# Patient Record
Sex: Male | Born: 1945 | Race: White | Hispanic: No | Marital: Married | Smoking: Former smoker
Health system: Southern US, Community
[De-identification: ages and names within clinical notes are randomized; demographics above are authoritative.]

## PROBLEM LIST (undated history)

## (undated) DIAGNOSIS — R51 Headache: Secondary | ICD-10-CM

## (undated) DIAGNOSIS — E785 Hyperlipidemia, unspecified: Secondary | ICD-10-CM

## (undated) DIAGNOSIS — I10 Essential (primary) hypertension: Secondary | ICD-10-CM

## (undated) DIAGNOSIS — N400 Enlarged prostate without lower urinary tract symptoms: Secondary | ICD-10-CM

## (undated) DIAGNOSIS — R351 Nocturia: Secondary | ICD-10-CM

## (undated) DIAGNOSIS — K219 Gastro-esophageal reflux disease without esophagitis: Secondary | ICD-10-CM

## (undated) DIAGNOSIS — C61 Malignant neoplasm of prostate: Secondary | ICD-10-CM

## (undated) DIAGNOSIS — K649 Unspecified hemorrhoids: Secondary | ICD-10-CM

## (undated) DIAGNOSIS — R011 Cardiac murmur, unspecified: Secondary | ICD-10-CM

## (undated) HISTORY — PX: COLONOSCOPY: SHX174

## (undated) HISTORY — PX: INGUINAL HERNIA REPAIR: SUR1180

## (undated) HISTORY — DX: Headache: R51

## (undated) HISTORY — DX: Hyperlipidemia, unspecified: E78.5

## (undated) HISTORY — DX: Cardiac murmur, unspecified: R01.1

---

## 2000-10-25 ENCOUNTER — Ambulatory Visit (HOSPITAL_COMMUNITY): Admission: RE | Admit: 2000-10-25 | Discharge: 2000-10-25 | Payer: Self-pay | Admitting: Surgery

## 2003-09-24 ENCOUNTER — Ambulatory Visit (HOSPITAL_COMMUNITY): Admission: RE | Admit: 2003-09-24 | Discharge: 2003-09-24 | Payer: Self-pay | Admitting: Internal Medicine

## 2004-02-11 ENCOUNTER — Emergency Department (HOSPITAL_COMMUNITY): Admission: EM | Admit: 2004-02-11 | Discharge: 2004-02-11 | Payer: Self-pay | Admitting: Emergency Medicine

## 2004-09-25 ENCOUNTER — Encounter (INDEPENDENT_AMBULATORY_CARE_PROVIDER_SITE_OTHER): Payer: Self-pay | Admitting: *Deleted

## 2004-09-25 ENCOUNTER — Ambulatory Visit (HOSPITAL_BASED_OUTPATIENT_CLINIC_OR_DEPARTMENT_OTHER): Admission: RE | Admit: 2004-09-25 | Discharge: 2004-09-25 | Payer: Self-pay | Admitting: Plastic Surgery

## 2004-09-25 ENCOUNTER — Ambulatory Visit (HOSPITAL_COMMUNITY): Admission: RE | Admit: 2004-09-25 | Discharge: 2004-09-25 | Payer: Self-pay | Admitting: Plastic Surgery

## 2004-09-25 HISTORY — PX: HEMANGIOMA EXCISION: SHX1734

## 2004-11-15 ENCOUNTER — Ambulatory Visit: Payer: Self-pay | Admitting: Internal Medicine

## 2004-11-22 ENCOUNTER — Ambulatory Visit: Payer: Self-pay | Admitting: Internal Medicine

## 2005-02-21 ENCOUNTER — Ambulatory Visit: Payer: Self-pay | Admitting: Internal Medicine

## 2005-02-28 ENCOUNTER — Ambulatory Visit: Payer: Self-pay | Admitting: Internal Medicine

## 2005-08-23 ENCOUNTER — Ambulatory Visit: Payer: Self-pay | Admitting: Internal Medicine

## 2005-08-31 ENCOUNTER — Ambulatory Visit: Payer: Self-pay | Admitting: Internal Medicine

## 2005-10-16 ENCOUNTER — Ambulatory Visit: Payer: Self-pay | Admitting: Internal Medicine

## 2005-12-03 ENCOUNTER — Ambulatory Visit: Payer: Self-pay | Admitting: Internal Medicine

## 2005-12-10 ENCOUNTER — Ambulatory Visit: Payer: Self-pay | Admitting: Internal Medicine

## 2006-02-05 ENCOUNTER — Ambulatory Visit: Payer: Self-pay | Admitting: Internal Medicine

## 2006-02-12 ENCOUNTER — Ambulatory Visit: Payer: Self-pay | Admitting: Internal Medicine

## 2006-10-21 ENCOUNTER — Ambulatory Visit: Payer: Self-pay | Admitting: Internal Medicine

## 2006-12-10 ENCOUNTER — Ambulatory Visit: Payer: Self-pay | Admitting: Internal Medicine

## 2006-12-10 LAB — CONVERTED CEMR LAB
ALT: 24 units/L (ref 0–40)
AST: 22 units/L (ref 0–37)
Albumin: 4.2 g/dL (ref 3.5–5.2)
Alkaline Phosphatase: 47 units/L (ref 39–117)
BUN: 17 mg/dL (ref 6–23)
Basophils Absolute: 0 10*3/uL (ref 0.0–0.1)
Basophils Relative: 0.4 % (ref 0.0–1.0)
CO2: 27 meq/L (ref 19–32)
Calcium: 9.4 mg/dL (ref 8.4–10.5)
Chloride: 108 meq/L (ref 96–112)
Cholesterol: 191 mg/dL (ref 0–200)
Creatinine, Ser: 1.1 mg/dL (ref 0.4–1.5)
Eosinophils Relative: 2.6 % (ref 0.0–5.0)
GFR calc Af Amer: 87 mL/min
GFR calc non Af Amer: 72 mL/min
Glucose, Bld: 102 mg/dL — ABNORMAL HIGH (ref 70–99)
HCT: 44 % (ref 39.0–52.0)
HDL: 66.7 mg/dL (ref >39.0)
Hemoglobin: 15.2 g/dL (ref 13.0–17.0)
LDL Cholesterol: 114 mg/dL — ABNORMAL HIGH (ref 0–99)
Lymphocytes Relative: 37 % (ref 12.0–46.0)
MCHC: 34.5 g/dL (ref 30.0–36.0)
MCV: 91.4 fL (ref 78.0–100.0)
Monocytes Absolute: 0.5 10*3/uL (ref 0.2–0.7)
Monocytes Relative: 10.7 % (ref 3.0–11.0)
Neutro Abs: 2.4 10*3/uL (ref 1.4–7.7)
Neutrophils Relative %: 49.3 % (ref 43.0–77.0)
PSA: 2.14 ng/mL (ref 0.10–4.00)
Platelets: 370 10*3/uL (ref 150–400)
Potassium: 4 meq/L (ref 3.5–5.1)
RBC: 4.81 M/uL (ref 4.22–5.81)
RDW: 12.8 % (ref 11.5–14.6)
Sodium: 141 meq/L (ref 135–145)
TSH: 0.83 microintl units/mL (ref 0.35–5.50)
Total Bilirubin: 0.9 mg/dL (ref 0.3–1.2)
Total CHOL/HDL Ratio: 2.9
Total Protein: 6.7 g/dL (ref 6.0–8.3)
Triglycerides: 52 mg/dL (ref 0–149)
VLDL: 10 mg/dL (ref 0–40)
WBC: 4.7 10*3/uL (ref 4.5–10.5)

## 2006-12-17 ENCOUNTER — Ambulatory Visit: Payer: Self-pay | Admitting: Internal Medicine

## 2007-01-16 ENCOUNTER — Ambulatory Visit: Payer: Self-pay | Admitting: Internal Medicine

## 2007-07-22 DIAGNOSIS — R51 Headache: Secondary | ICD-10-CM | POA: Insufficient documentation

## 2007-07-22 DIAGNOSIS — E785 Hyperlipidemia, unspecified: Secondary | ICD-10-CM | POA: Insufficient documentation

## 2007-07-22 DIAGNOSIS — R519 Headache, unspecified: Secondary | ICD-10-CM | POA: Insufficient documentation

## 2009-03-04 ENCOUNTER — Ambulatory Visit: Payer: Self-pay | Admitting: Internal Medicine

## 2009-03-04 ENCOUNTER — Telehealth: Payer: Self-pay | Admitting: Internal Medicine

## 2009-03-04 DIAGNOSIS — J0100 Acute maxillary sinusitis, unspecified: Secondary | ICD-10-CM | POA: Insufficient documentation

## 2009-03-09 ENCOUNTER — Ambulatory Visit: Payer: Self-pay | Admitting: Internal Medicine

## 2010-01-31 ENCOUNTER — Telehealth: Payer: Self-pay | Admitting: Internal Medicine

## 2010-01-31 ENCOUNTER — Ambulatory Visit: Payer: Self-pay | Admitting: Internal Medicine

## 2010-01-31 DIAGNOSIS — M67919 Unspecified disorder of synovium and tendon, unspecified shoulder: Secondary | ICD-10-CM | POA: Insufficient documentation

## 2010-01-31 DIAGNOSIS — S40029A Contusion of unspecified upper arm, initial encounter: Secondary | ICD-10-CM | POA: Insufficient documentation

## 2010-01-31 DIAGNOSIS — M719 Bursopathy, unspecified: Secondary | ICD-10-CM | POA: Insufficient documentation

## 2010-03-27 ENCOUNTER — Ambulatory Visit: Payer: Self-pay | Admitting: Internal Medicine

## 2010-03-27 LAB — CONVERTED CEMR LAB
ALT: 19 units/L (ref 0–53)
AST: 21 units/L (ref 0–37)
Albumin: 4.5 g/dL (ref 3.5–5.2)
Alkaline Phosphatase: 52 units/L (ref 39–117)
BUN: 16 mg/dL (ref 6–23)
Basophils Absolute: 0 10*3/uL (ref 0.0–0.1)
Basophils Relative: 0.9 % (ref 0.0–3.0)
Bilirubin Urine: NEGATIVE
Bilirubin, Direct: 0.2 mg/dL (ref 0.0–0.3)
Blood in Urine, dipstick: NEGATIVE
CO2: 30 meq/L (ref 19–32)
Calcium: 9.6 mg/dL (ref 8.4–10.5)
Chloride: 105 meq/L (ref 96–112)
Cholesterol: 221 mg/dL — ABNORMAL HIGH (ref 0–200)
Creatinine, Ser: 0.8 mg/dL (ref 0.4–1.5)
Direct LDL: 134.3 mg/dL
Eosinophils Absolute: 0.1 10*3/uL (ref 0.0–0.7)
Eosinophils Relative: 1.2 % (ref 0.0–5.0)
GFR calc non Af Amer: 109.25 mL/min (ref >60)
Glucose, Bld: 96 mg/dL (ref 70–99)
Glucose, Urine, Semiquant: NEGATIVE
HCT: 44.4 % (ref 39.0–52.0)
HDL: 72.3 mg/dL (ref >39.00)
Hemoglobin: 15.3 g/dL (ref 13.0–17.0)
Ketones, urine, test strip: NEGATIVE
Lymphocytes Relative: 38.4 % (ref 12.0–46.0)
Lymphs Abs: 2 10*3/uL (ref 0.7–4.0)
MCHC: 34.3 g/dL (ref 30.0–36.0)
MCV: 92.1 fL (ref 78.0–100.0)
Monocytes Absolute: 0.4 10*3/uL (ref 0.1–1.0)
Monocytes Relative: 8.1 % (ref 3.0–12.0)
Neutro Abs: 2.7 10*3/uL (ref 1.4–7.7)
Neutrophils Relative %: 51.4 % (ref 43.0–77.0)
Nitrite: NEGATIVE
PSA: 4.69 ng/mL — ABNORMAL HIGH (ref 0.10–4.00)
Platelets: 314 10*3/uL (ref 150.0–400.0)
Potassium: 4.7 meq/L (ref 3.5–5.1)
Protein, U semiquant: NEGATIVE
RBC: 4.82 M/uL (ref 4.22–5.81)
RDW: 14.4 % (ref 11.5–14.6)
Sodium: 142 meq/L (ref 135–145)
Specific Gravity, Urine: 1.015
TSH: 0.76 microintl units/mL (ref 0.35–5.50)
Total Bilirubin: 1.2 mg/dL (ref 0.3–1.2)
Total CHOL/HDL Ratio: 3
Total Protein: 6.9 g/dL (ref 6.0–8.3)
Triglycerides: 48 mg/dL (ref 0.0–149.0)
Urobilinogen, UA: 0.2
VLDL: 9.6 mg/dL (ref 0.0–40.0)
WBC Urine, dipstick: NEGATIVE
WBC: 5.2 10*3/uL (ref 4.5–10.5)
pH: 5.5

## 2010-04-03 ENCOUNTER — Ambulatory Visit: Payer: Self-pay | Admitting: Internal Medicine

## 2010-04-03 DIAGNOSIS — C61 Malignant neoplasm of prostate: Secondary | ICD-10-CM | POA: Insufficient documentation

## 2010-04-10 ENCOUNTER — Encounter (INDEPENDENT_AMBULATORY_CARE_PROVIDER_SITE_OTHER): Payer: Self-pay | Admitting: *Deleted

## 2010-05-05 ENCOUNTER — Encounter (INDEPENDENT_AMBULATORY_CARE_PROVIDER_SITE_OTHER): Payer: Self-pay | Admitting: *Deleted

## 2010-05-08 ENCOUNTER — Ambulatory Visit: Payer: Self-pay | Admitting: Gastroenterology

## 2010-05-23 ENCOUNTER — Telehealth: Payer: Self-pay | Admitting: Gastroenterology

## 2010-05-24 ENCOUNTER — Ambulatory Visit: Payer: Self-pay | Admitting: Internal Medicine

## 2010-06-01 LAB — CONVERTED CEMR LAB
PSA, Free Pct: 15 — ABNORMAL LOW (ref >25)
PSA, Free: 0.6 ng/mL
PSA: 4.02 ng/mL — ABNORMAL HIGH (ref 0.10–4.00)

## 2010-08-09 ENCOUNTER — Ambulatory Visit: Payer: Self-pay | Admitting: Internal Medicine

## 2010-08-15 ENCOUNTER — Ambulatory Visit: Payer: Self-pay | Admitting: Gastroenterology

## 2010-10-23 ENCOUNTER — Telehealth: Payer: Self-pay | Admitting: Internal Medicine

## 2010-10-30 ENCOUNTER — Encounter: Payer: Self-pay | Admitting: Internal Medicine

## 2010-11-01 ENCOUNTER — Encounter
Admission: RE | Admit: 2010-11-01 | Discharge: 2010-11-01 | Payer: Self-pay | Source: Home / Self Care | Attending: Neurology | Admitting: Neurology

## 2010-12-21 NOTE — Assessment & Plan Note (Signed)
Summary: FLU SHOT // RS  Nurse Visit   Vitals Entered By: Duard Brady LPN (August 09, 2010 2:35 PM)  Orders Added: 1)  Flu Vaccine 43yrs + MEDICARE PATIENTS [Q2039] 2)  Administration Flu vaccine - MCR [G0008] Flu Vaccine Consent Questions     Do you have a history of severe allergic reactions to this vaccine? no    Any prior history of allergic reactions to egg and/or gelatin? no    Do you have a sensitivity to the preservative Thimersol? no    Do you have a past history of Guillan-Barre Syndrome? no    Do you currently have an acute febrile illness? no    Have you ever had a severe reaction to latex? no    Vaccine information given and explained to patient? yes    Are you currently pregnant? no    Lot Number:[id number]   Exp Date:05/19/2011   Site Given  Left Deltoid IM.lbmedflu

## 2010-12-21 NOTE — Assessment & Plan Note (Signed)
Summary: cpx/njr   Vital Signs:  Patient profile:   65 year old male Height:      70 inches Weight:      181 pounds BMI:     26.06 Temp:     98.2 degrees F oral Pulse rate:   72 / minute Resp:     14 per minute BP sitting:   110 / 70  (left arm)  Vitals Entered By: Willy Eddy, LPN (Apr 03, 2010 10:35 AM)  Contraindications/Deferment of Procedures/Staging:    Test/Procedure: FLU VAX    Reason for deferment: patient declined     Test/Procedure: Pneumovax vaccine    Reason for deferment: patient declined  CC: cpx--had flex >5 years ago   CC:  cpx--had flex >5 years ago.  History of Present Illness: The pt was asked about all immunizations, health maint. services that are appropriate to their age and was given guidance on diet exercize  and weight management   Preventive Screening-Counseling & Management  Alcohol-Tobacco     Smoking Status: quit  Problems Prior to Update: 1)  Bursitis, Left Shoulder  (ICD-726.10) 2)  Contusion of Unspecified Part of Upper Limb  (ICD-923.9) 3)  Acute Maxillary Sinusitis  (ICD-461.0) 4)  Headache  (ICD-784.0) 5)  Hyperlipidemia  (ICD-272.4)  Current Problems (verified): 1)  Bursitis, Left Shoulder  (ICD-726.10) 2)  Contusion of Unspecified Part of Upper Limb  (ICD-923.9) 3)  Acute Maxillary Sinusitis  (ICD-461.0) 4)  Headache  (ICD-784.0) 5)  Hyperlipidemia  (ICD-272.4)  Medications Prior to Update: 1)  Aspir-Low 81 Mg Tbec (Aspirin) .Marland Kitchen.. 1 Once Daily  Current Medications (verified): 1)  Aspir-Low 81 Mg Tbec (Aspirin) .Marland Kitchen.. 1 Once Daily 2)  Ciprofloxacin Hcl 500 Mg Tabs (Ciprofloxacin Hcl) .... One By Mouth Two Times A Day For 21 Days  Allergies (verified): 1)  ! Pcn  Past History:  Family History: Last updated: 07/22/2007 Family History of Cardiovascular disorder Family History of Respiratory disease  Social History: Last updated: 07/22/2007 Occupation: Self-Employed Married Former Smoker  Risk  Factors: Smoking Status: quit (04/03/2010)  Past medical, surgical, family and social histories (including risk factors) reviewed, and no changes noted (except as noted below).  Past Medical History: Reviewed history from 07/22/2007 and no changes required. Hyperlipidemia Headache  Past Surgical History: Reviewed history from 07/22/2007 and no changes required. Inguinal herniorrhaphy Flexible Sigmoidoscopy  Family History: Reviewed history from 07/22/2007 and no changes required. Family History of Cardiovascular disorder Family History of Respiratory disease  Social History: Reviewed history from 07/22/2007 and no changes required. Occupation: Self-Employed Married Former Smoker  Review of Systems  The patient denies anorexia, fever, weight loss, weight gain, vision loss, decreased hearing, hoarseness, chest pain, syncope, dyspnea on exertion, peripheral edema, prolonged cough, headaches, hemoptysis, abdominal pain, melena, hematochezia, severe indigestion/heartburn, hematuria, incontinence, genital sores, muscle weakness, suspicious skin lesions, transient blindness, difficulty walking, depression, unusual weight change, abnormal bleeding, enlarged lymph nodes, angioedema, breast masses, and testicular masses.    Physical Exam  General:  Well-developed,well-nourished,in no acute distress; alert,appropriate and cooperative throughout examination Head:  Normocephalic and atraumatic without obvious abnormalities. No apparent alopecia or balding. Eyes:  pupils equal and pupils round.   Ears:  R ear normal and L ear normal.   Nose:  no external deformity and no nasal discharge.   Neck:  No deformities, masses, or tenderness noted. Lungs:  normal respiratory effort and no dullness.   Heart:  normal rate and regular rhythm.   Abdomen:  soft, normal bowel  sounds, and no guarding.   Rectal:  No external abnormalities noted. Normal sphincter tone. No rectal masses or  tenderness. Genitalia:  Testes bilaterally descended without nodularity, tenderness or masses. No scrotal masses or lesions. No penis lesions or urethral discharge. Prostate:  no gland enlargement and no nodules.   Msk:  hematoma on left forearmdecreased ROM, joint tenderness, and joint swelling. left shoulder no rotator cuff weakness Pulses:  R and L carotid,radial,femoral,dorsalis pedis and posterior tibial pulses are full and equal bilaterally Extremities:  No clubbing, cyanosis, edema, or deformity noted with normal full range of motion of all joints.   Neurologic:  No cranial nerve deficits noted. Station and gait are normal. Plantar reflexes are down-going bilaterally. DTRs are symmetrical throughout. Sensory, motor and coordinative functions appear intact.   Impression & Recommendations:  Problem # 1:  HEALTH SCREENING (ICD-V70.0) The pt was asked about all immunizations, health maint. services that are appropriate to their age and was given guidance on diet exercize  and weight management  Orders:  bradycardia otherwise stable EKG w/ Interpretation (93000)  Td Booster: Historical (11/19/2001)   Chol: 221 (03/27/2010)   HDL: 72.30 (03/27/2010)   LDL: 114 (12/10/2006)   TG: 48.0 (03/27/2010) TSH: 0.76 (03/27/2010)   PSA: 4.69 (03/27/2010)  Discussed using sunscreen, use of alcohol, drug use, self testicular exam, routine dental care, routine eye care, routine physical exam, seat belts, multiple vitamins, osteoporosis prevention, adequate calcium intake in diet, and recommendations for immunizations.  Discussed exercise and checking cholesterol.  Discussed gun safety, safe sex, and contraception. Also recommend checking PSA.  Problem # 2:  PSA, INCREASED (ICD-790.93) cipro bid  fro 21 days  Complete Medication List: 1)  Aspir-low 81 Mg Tbec (Aspirin) .Marland Kitchen.. 1 once daily 2)  Ciprofloxacin Hcl 500 Mg Tabs (Ciprofloxacin hcl) .... One by mouth two times a day for 21 days  Other  Orders: Gastroenterology Referral (GI)  Patient Instructions: 1)  Please schedule a follow-up appointment in 6 weeks. 2)  PSA and free psaq prior to visit, ICD-9:790.93 Prescriptions: CIPROFLOXACIN HCL 500 MG TABS (CIPROFLOXACIN HCL) one by mouth two times a day for 21 days  #42 x 0   Entered and Authorized by:   Stacie Glaze MD   Signed by:   Stacie Glaze MD on 04/03/2010   Method used:   Electronically to        [facility]* (retail)       [address] [place], [place]  [address]       Ph: [phone] or [phone]       Fax: [phone]   RxID:   [id number]

## 2010-12-21 NOTE — Letter (Signed)
Summary: [facility] Instructions  [name]  [address] [place], [place] [address]   Phone: [phone]  Fax: [phone]       Dan Dudley    01-May-1946    MRN: [id number]        Procedure Day [name]:  Lenor Coffin  05/25/10     Arrival Time:  9:00AM     Procedure Time:  10:00AM     Location of Procedure:                    _ X_  [facility] (4th Floor)  PREPARATION FOR COLONOSCOPY WITH MOVIPREP   Starting 5 days prior to your procedure 05/20/10 do not eat nuts, seeds, popcorn, corn, beans, peas,  salads, or any raw vegetables.  Do not take any fiber supplements (e.g. Metamucil, Citrucel, and Benefiber).  THE DAY BEFORE YOUR PROCEDURE         DATE: 05/24/10  DAY: WEDNESDAY  1.  Drink clear liquids the entire day-NO SOLID FOOD  2.  Do not drink anything colored red or purple.  Avoid juices with pulp.  No orange juice.  3.  Drink at least 64 oz. (8 glasses) of fluid/clear liquids during the day to prevent dehydration and help the prep work efficiently.  CLEAR LIQUIDS INCLUDE: Water Jello Ice Popsicles Tea (sugar ok, no milk/cream) Powdered fruit flavored drinks Coffee (sugar ok, no milk/cream) Gatorade Juice: apple, white grape, white cranberry  Lemonade Clear bullion, consomm, broth Carbonated beverages (any kind) Strained chicken noodle soup Hard Candy                             4.  In the morning, mix first dose of MoviPrep solution:    Empty 1 Pouch A and 1 Pouch B into the disposable container    Add lukewarm drinking water to the top line of the container. Mix to dissolve    Refrigerate (mixed solution should be used within 24 hrs)  5.  Begin drinking the prep at 5:00 p.m. The MoviPrep container is divided by 4 marks.   Every 15 minutes drink the solution down to the next mark (approximately 8 oz) until the full liter is complete.   6.  Follow completed prep with 16 oz of clear liquid of your choice (Nothing red or purple).   Continue to drink clear liquids until bedtime.  7.  Before going to bed, mix second dose of MoviPrep solution:    Empty 1 Pouch A and 1 Pouch B into the disposable container    Add lukewarm drinking water to the top line of the container. Mix to dissolve    Refrigerate  THE DAY OF YOUR PROCEDURE      DATE: 05/25/10 DAY: THURSDAY  Beginning at 5:00AM (5 hours before procedure):         1. Every 15 minutes, drink the solution down to the next mark (approx 8 oz) until the full liter is complete.  2. Follow completed prep with 16 oz. of clear liquid of your choice.    3. You may drink clear liquids until 8:00AM (2 HOURS BEFORE PROCEDURE).   MEDICATION INSTRUCTIONS  Unless otherwise instructed, you should take regular prescription medications with a small sip of water   as early as possible the morning of your procedure.          OTHER INSTRUCTIONS  You will need a responsible adult at least  67 years of age to accompany you and drive you home.   This person must remain in the waiting room during your procedure.  Wear loose fitting clothing that is easily removed.  Leave jewelry and other valuables at home.  However, you may wish to bring a book to read or  an iPod/MP3 player to listen to music as you wait for your procedure to start.  Remove all body piercing jewelry and leave at home.  Total time from sign-in until discharge is approximately 2-3 hours.  You should go home directly after your procedure and rest.  You can resume normal activities the  day after your procedure.      The day of your procedure you should not:   Drive   Make legal decisions   Operate machinery   Drink alcohol   Return to work  You will receive specific instructions about eating, activities and medications before you leave.    The above instructions have been reviewed and explained to me by  Jennye Boroughs RN  May 08, 2010 8:11 AM  _______________________    I fully  understand and can verbalize these instructions _____________________________ Date _________

## 2010-12-21 NOTE — Progress Notes (Signed)
Summary: arm discoloration and painful  Phone Note Call from Patient   Caller: Patient Call For: Stacie Glaze MD Summary of Call: Pt's wife calls stating he starting with cramping with his left forearm.  Now his shoulder is hurting and forearm is black and blue???  No injury. Shoulder pain is worse and has appt with ORTHO next week that radiates to entire arm. Feels like a hard knot at times.  Color is mainly brown and yellow. [phone] Initial call taken by: Lynann Beaver CMA,  January 31, 2010 2:18 PM  Follow-up for Phone Call        ov given for today Follow-up by: Willy Eddy, LPN,  January 31, 2010 2:42 PM

## 2010-12-21 NOTE — Miscellaneous (Signed)
Summary: DIR COL...AS.  Clinical Lists Changes  Medications: Added new medication of MOVIPREP 100 GM  SOLR (PEG-KCL-NACL-NASULF-NA ASC-C) As per prep instructions. - Signed Rx of MOVIPREP 100 GM  SOLR (PEG-KCL-NACL-NASULF-NA ASC-C) As per prep instructions.;  #1 x 0;  Signed;  Entered by: Jennye Boroughs RN;  Authorized by: Meryl Dare MD Clementeen Graham;  Method used: Electronically to [facility]*, [address] [place], [place]  [address], Ph: [phone] or [phone], Fax: [phone] Allergies: Removed allergy or adverse reaction of PCN    Prescriptions: MOVIPREP 100 GM  SOLR (PEG-KCL-NACL-NASULF-NA ASC-C) As per prep instructions.  #1 x 0   Entered by:   Jennye Boroughs RN   Authorized by:   Meryl Dare MD [facility]   Signed by:   Jennye Boroughs RN on 05/08/2010   Method used:   Electronically to        [facility]* (retail)       [address] [place], [place]  [address]       Ph: [phone] or [phone]       Fax: [phone]   RxID:   [id number]

## 2010-12-21 NOTE — Progress Notes (Signed)
Summary: Cx Colon  Phone Note Call from Patient Call back at Home Phone [phone]   Caller: Patients Wife Summary of Call: She called and pt is on his way to [place] to be with is mother who is on a vent and has a DNR the MDs in [facility] told him it was time to come down  that she was not going to live much longer. His colonoscopy was schedule for 05/25/2010. do you want to charge? They will call back to resch when everything with the funeral is over.  Initial call taken by: Harlow Mares CMA Duncan Dull),  May 23, 2010 4:28 PM  Follow-up for Phone Call        No charge. Follow-up by: Meryl Dare MD Clementeen Graham,  May 23, 2010 9:42 PM  Additional Follow-up for Phone Call Additional follow up Details #1::        Patient NOT BILLED. Additional Follow-up by: Leanor [facility],  May 30, 2010 4:33 PM

## 2010-12-21 NOTE — Progress Notes (Signed)
  Phone Note Call from Patient Call back at Home Phone [phone]   Caller: Patient Call For: Stacie Glaze MD Summary of Call: Hit head on car door 7 weeks ago but has had constant headaches with a state of euphoria???  Headaches are getting worse. [facility].  Initial call taken by: Lynann Beaver CMA AAMA,  October 23, 2010 12:52 PM  Follow-up for Phone Call        needs to see neurologist Follow-up by: Willy Eddy, LPN,  October 23, 2010 3:17 PM  Additional Follow-up for Phone Call Additional follow up Details #1::        Notified pt. Additional Follow-up by: Lynann Beaver CMA AAMA,  October 23, 2010 4:36 PM

## 2010-12-21 NOTE — Assessment & Plan Note (Signed)
Summary: arm pain/bmw   Vital Signs:  Patient profile:   64 year old male Height:      70 inches Weight:      184 pounds BMI:     26.50 Temp:     98.2 degrees F oral Pulse rate:   72 / minute Resp:     14 per minute BP sitting:   120 / 80  (left arm)  Vitals Entered By: Willy Eddy, LPN (January 31, 2010 3:57 PM) CC: stasrted with knot in lower left arm, then had discoloration after that with no pain but does have pain in left shoulder   CC:  stasrted with knot in lower left arm and then had discoloration after that with no pain but does have pain in left shoulder.  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 i suspect he tore a vein and developed a hematoma in the forarm he has symptoms of bursitis in the left shoulder as well but rotator cuff is intact to all manipulation   Preventive Screening-Counseling & Management  Alcohol-Tobacco     Smoking Status: quit  Problems Prior to Update: 1)  Acute Maxillary Sinusitis  (ICD-461.0) 2)  Headache  (ICD-784.0) 3)  Hyperlipidemia  (ICD-272.4)  Current Problems (verified): 1)  Acute Maxillary Sinusitis  (ICD-461.0) 2)  Headache  (ICD-784.0) 3)  Hyperlipidemia  (ICD-272.4)  Medications Prior to Update: 1)  Tricor 145 Mg  Tabs (Fenofibrate) 2)  Nasonex 50 Mcg/act  Susp (Mometasone Furoate) 3)  Azithromycin 250 Mg Tabs (Azithromycin) .... Two By Mouth Now Then One By Mouth Daily For 4 Days 4)  Phenflu Dm 10-20-400-500 Mg Tabs (Phenylephrine-Dm-Gg-Apap) .... One By Mouth Qid  Current Medications (verified): 1)  Aspir-Low 81 Mg Tbec (Aspirin) .Marland Kitchen.. 1 Once Daily  Allergies (verified): 1)  ! Pcn  Past History:  Family History: Last updated: 07/22/2007 Family History of Cardiovascular disorder Family History of Respiratory disease  Social History: Last updated: 07/22/2007 Occupation: Self-Employed Married Former Smoker  Risk  Factors: Smoking Status: quit (01/31/2010)  Past medical, surgical, family and social histories (including risk factors) reviewed, and no changes noted (except as noted below).  Past Medical History: Reviewed history from 07/22/2007 and no changes required. Hyperlipidemia Headache  Past Surgical History: Reviewed history from 07/22/2007 and no changes required. Inguinal herniorrhaphy Flexible Sigmoidoscopy  Family History: Reviewed history from 07/22/2007 and no changes required. Family History of Cardiovascular disorder Family History of Respiratory disease  Social History: Reviewed history from 07/22/2007 and no changes required. Occupation: Self-Employed Married Former Smoker  Review of Systems  The patient denies anorexia, fever, weight loss, weight gain, vision loss, decreased hearing, hoarseness, chest pain, syncope, dyspnea on exertion, peripheral edema, prolonged cough, headaches, hemoptysis, abdominal pain, melena, hematochezia, severe indigestion/heartburn, hematuria, incontinence, genital sores, muscle weakness, suspicious skin lesions, transient blindness, difficulty walking, depression, unusual weight change, abnormal bleeding, enlarged lymph nodes, angioedema, breast masses, and testicular masses.    Physical Exam  General:  Well-developed,well-nourished,in no acute distress; alert,appropriate and cooperative throughout examination Head:  Normocephalic and atraumatic without obvious abnormalities. No apparent alopecia or balding. Eyes:  pupils equal and pupils round.   Ears:  R ear normal and L ear normal.   Nose:  no external deformity and no nasal discharge.   Neck:  No deformities, masses, or tenderness noted. Lungs:  normal respiratory effort and no dullness.   Heart:  normal rate and regular  rhythm.   Msk:  hematoma on left forearmdecreased ROM, joint tenderness, and joint swelling. left shoulder no rotator cuff weakness Pulses:  R and L  carotid,radial,femoral,dorsalis pedis and posterior tibial pulses are full and equal bilaterally Extremities:  No clubbing, cyanosis, edema, or deformity noted with normal full range of motion of all joints.   Neurologic:  No cranial nerve deficits noted. Station and gait are normal. Plantar reflexes are down-going bilaterally. DTRs are symmetrical throughout. Sensory, motor and coordinative functions appear intact.   Impression & Recommendations:  Problem # 1:  CONTUSION OF UNSPECIFIED PART OF UPPER LIMB (ICD-923.9) local care no skin nreakdown and hematoma is resolving  Problem # 2:  BURSITIS, LEFT SHOULDER (ICD-726.10)  Informed consent obtained and then the lef shoulder joint was prepped in a sterile manor and 40 mg depo and 1/2 cc 1% lidocaine injected into the synovial space. After care discussed. Pt tolerated procedure well.  Orders: Joint Aspirate / Injection, Large (20610) Depo- Medrol 40mg  (J1030)  Complete Medication List: 1)  Aspir-low 81 Mg Tbec (Aspirin) .Marland Kitchen.. 1 once daily

## 2010-12-21 NOTE — Consult Note (Signed)
Summary: [facility]  [facility]   Imported By: Maryln Gottron 11/03/2010 12:30:16  _____________________________________________________________________  External Attachment:    Type:   Image     Comment:   External Document

## 2010-12-21 NOTE — Procedures (Signed)
Summary: Colonoscopy  Patient: Dan Dudley Note: All result statuses are Final unless otherwise noted.  Tests: (1) Colonoscopy (COL)   COL Colonoscopy           DONE     [facility]     [address]     [place], [place]  [address]           COLONOSCOPY PROCEDURE REPORT           PATIENT:  Dan Dudley, Dan Dudley  MR#:  [id number]     BIRTHDATE:  1946-01-23, 63 yrs. old  GENDER:  male     ENDOSCOPIST:  Judie Petit T. Russella Dar, MD, [facility]     Referred by:  Stacie Glaze, M.D.     PROCEDURE DATE:  08/15/2010     PROCEDURE:  Colonoscopy 19[address]     ASA CLASS:  Class II     INDICATIONS:  1) Routine Risk Screening     MEDICATIONS:   Fentanyl 75 mcg IV, Versed 7 mg IV     DESCRIPTION OF PROCEDURE:   After the risks benefits and     alternatives of the procedure were thoroughly explained, informed     consent was obtained.  Digital rectal exam was performed and     revealed large external hemorrhoids.   The LB PCF-H180AL [id number]     endoscope was introduced through the anus and advanced to the     cecum, which was identified by both the appendix and ileocecal     valve, without limitations.  The quality of the prep was     excellent, using MoviPrep.  The instrument was then slowly     withdrawn as the colon was fully examined.     <<PROCEDUREIMAGES>>     FINDINGS:  Mild diverticulosis was found in the sigmoid colon. A     normal appearing cecum, ileocecal valve, and appendiceal orifice     were identified. The ascending, hepatic flexure, transverse,     splenic flexure, descending colon, and rectum appeared     unremarkable. Retroflexed views in the rectum revealed internal     hemorrhoids, small. The time to cecum =  1.67  minutes. The scope     was then withdrawn (time =  9  min) from the patient and the     procedure completed.           COMPLICATIONS:  None           ENDOSCOPIC IMPRESSION:     1) Mild diverticulosis in the sigmoid colon     2) internal and external  hemorrhoids           RECOMMENDATIONS:     1) High fiber diet with liberal fluid intake.     2) Colonoscopy in 10 years for routine CRC screening           Malcolm T. Russella Dar, MD, Clementeen Graham           n.     eSIGNED:   Venita Lick. Stark at 08/15/2010 10:14 AM           Cleatrice Burke, [phone]  Note: An exclamation mark (!) indicates a result that was not dispersed into the flowsheet. Document Creation Date: 08/15/2010 10:15 AM _______________________________________________________________________  (1) Order result status: Final Collection or observation date-time: 08/15/2010 10:06 Requested date-time:  Receipt date-time:  Reported date-time:  Referring Physician:   Ordering Physician: Claudette Head [phone]) Specimen Source:  Source: Launa Grill Order Number: [id number] Lab  site:   Appended Document: Colonoscopy    Clinical Lists Changes  Observations: Added new observation of COLONNXTDUE: 07/2020 (08/15/2010 15:54)

## 2010-12-21 NOTE — Letter (Signed)
Summary: Previsit letter  [facility]  [address] [place], [place] [address]   Phone: [phone]  Fax: [phone]       04/10/2010 MRN: [id number]  [facility] [address] [place], [place]  [address]  Dear Dan Dudley,  Welcome to the Gastroenterology Division at [facility].    You are scheduled to see a nurse for your pre-procedure visit on 05/08/2010 at 8:00am on the 3rd floor at [facility], [address]  We ask that you try to arrive at our office 15 minutes prior to your appointment time to allow for check-in.  Your nurse visit will consist of discussing your medical and surgical history, your immediate family medical history, and your medications.    Please bring a complete list of all your medications or, if you prefer, bring the medication bottles and we will list them.  We will need to be aware of both prescribed and over the counter drugs.  We will need to know exact dosage information as well.  If you are on blood thinners (Coumadin, Plavix, Aggrenox, Ticlid, etc.) please call our office today/prior to your appointment, as we need to consult with your physician about holding your medication.   Please be prepared to read and sign documents such as consent forms, a financial agreement, and acknowledgement forms.  If necessary, and with your consent, a friend or relative is welcome to sit-in on the nurse visit with you.  Please bring your insurance card so that we may make a copy of it.  If your insurance requires a referral to see a specialist, please bring your referral form from your primary care physician.  No co-pay is required for this nurse visit.     If you cannot keep your appointment, please call [phone] to cancel or reschedule prior to your appointment date.  This allows [place] the opportunity to schedule an appointment for another patient in need of care.    Thank you for choosing [facility] for  your medical needs.  We appreciate the opportunity to care for you.  Please visit [place] at our website  to learn more about our practice.                     Sincerely.                                                                                                                   The Gastroenterology Division

## 2011-04-06 NOTE — Op Note (Signed)
NAMEDAION, GINSBERG         ACCOUNT NO.:  [id number]   MEDICAL RECORD NO.:  [id number]          PATIENT TYPE:  AMB   LOCATION:  DSC                          FACILITY:  [facility]   PHYSICIAN:  Alfredia Ferguson, M.D.  DATE OF BIRTH:  July 31, 1946   DATE OF PROCEDURE:  09/25/2004  DATE OF DISCHARGE:                                 OPERATIVE REPORT   PREOPERATIVE DIAGNOSIS:  A 7 mm hemangioma, left lower lip.   POSTOPERATIVE DIAGNOSIS:  A 7 mm hemangioma, left lower lip.   PROCEDURE:  Elliptical excision of hemangioma, left lower lip.   SURGEON:  Alfredia Ferguson, M.D.   ANESTHESIA:  Xylocaine 2% with 1:100,000 epinephrine.   INDICATIONS FOR PROCEDURE:  This is a 65 year old man with a fairly rapidly  enlarging hemangioma of the left lower lip.  It is prominent, and he is at  risk of biting it, and developing significant bleeding. For that reason, the  patient wishes to have it removed. He understands the risks including  asymmetry of the lip and unsightly scarring.  In spite of that, he wishes to  proceed.   DESCRIPTION OF PROCEDURE:  Skin marks were placed in an elliptical fashion  around the lesion of the lip.  The excision continued down below the  vermilion border.  Local anesthesia was infiltrated in the lip.  The area  was prepped with Betadine and draped with sterile drapes.   An elliptical excision of the lesion was carried out.  This included a  portion of the orbicularis muscle in order to get around the hemangioma.  Specimen was passed off for pathology.  Hemostasis was accomplished with  pressure. The vermilion border, which had been marked pre-injection, was now  aligned with an interrupted 6-0 nylon suture.  There was a 6-0 nylon suture  placed above and below the vermilion border.  The rest of the vermilion was  approximated with interrupted 5-0 chromic suture.  The patient tolerated the  procedure well. He was discharged to home in satisfactory condition.      Tiburcio Pea  D:  09/25/2004  T:  09/25/2004  Job:  [id number]

## 2011-04-06 NOTE — Op Note (Signed)
[facility]  Patient:    ROLLO, FARQUHAR                MRN: [id number] Proc. Date: 10/25/00 Adm. Date:  [id number] Attending:  Katha Cabal CC:         Stacie Glaze, M.D. [facility]   Operative Report  PREOPERATIVE DIAGNOSIS:  Left inguinal hernia.  POSTOPERATIVE DIAGNOSIS: Left sliding inguinal hernia containing sigmoid colon. OPERATION:  Left inguinal herniorrhaphy with mesh plug and patch.  SURGEON:  Thornton Park. Daphine Deutscher, M.D.  ANESTHESIA:  MAC.  DESCRIPTION OF PROCEDURE:  Mr. Herne was taken to Room 11, and the left inguinal region was shaved and then prepped with Betadine widely and draped sterilely.  He was given a gram of Ancef.  I did a regional block with a mixture of Marcaine and lidocaine at the anterior iliac spine, and then I injected a small oblique incision.  I made the incision and carried this down to the external oblique which I incised.  I separated the ilioinguinal nerve branch with the superior flap of external oblique and mobilized his cord.  I found a large proximal hernia which I separated from the cord structures and opened and got into hernia sac which contained on its lateral wall mainly sigmoid colon.  I ended up closing the sac and tucking all this back down into the abdomen after freeing it up from the cord.  Also, I freed up and reduced a lipoma of the cord.  Maintaining this into the abdomen, I used a Davol barred prefix mesh plug, size large, into this rather large indirect inguinal hernia. I sutured it at the perimeter with about five sutures of 2-0 Prolene.  I used the overlying patch and sutured along the inguinal ligament with a running 2-0 Prolene and medially to the internal oblique with a running 2-0 Prolene.  I suture it to its self laterally as well as tacked it down to the plug.  I had to make the opening in the patch a little bit larger to accommodate his anatomy, and everything to be lying  nicely.  The area was irrigated with saline.  No bleeding was seen.  The external oblique was closed with a running 2-0 Vicryl; 4-0 Vicryl was used subcutaneously and subcuticularly with benzoin and Steri-Strips on the skin.  The patient seemed to tolerate the procedure well and was taken to the recovery room satisfactory condition.  He will be given Mepergan Fortis to take for pain.  He will be followed up in the office in two weeks. DD:  10/25/00 TD:  10/26/00 Job: [id number]

## 2011-05-09 ENCOUNTER — Encounter: Payer: Self-pay | Admitting: Internal Medicine

## 2011-05-09 ENCOUNTER — Ambulatory Visit (INDEPENDENT_AMBULATORY_CARE_PROVIDER_SITE_OTHER): Payer: BC Managed Care – PPO | Admitting: Internal Medicine

## 2011-05-09 VITALS — BP 120/80 | HR 68 | Temp 98.2°F | Resp 14 | Ht 70.0 in | Wt 179.0 lb

## 2011-05-09 DIAGNOSIS — F4310 Post-traumatic stress disorder, unspecified: Secondary | ICD-10-CM

## 2011-05-09 NOTE — Progress Notes (Signed)
  Subjective:    Patient ID: Dan Dudley, male    DOB: [birth year], 65 y.o.   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 He is tear full in his recall of these events. Sleep hygiene poor and has night sweats and early awakening Anger  Issues and short fuse. Desires to work alone. Gets emotional and distresssed when discusses the war and events of the war He cannot predict the frequency of these events    Review of Systems  Constitutional: Negative for fever and fatigue.  HENT: Negative for hearing loss, congestion, neck pain and postnasal drip.   Eyes: Negative for discharge, redness and visual disturbance.  Respiratory: Negative for cough, shortness of breath and wheezing.   Cardiovascular: Negative for leg swelling.  Gastrointestinal: Negative for abdominal pain, constipation and abdominal distention.  Genitourinary: Negative for urgency and frequency.  Musculoskeletal: Negative for joint swelling and arthralgias.  Skin: Negative for color change and rash.  Neurological: Negative for weakness and light-headedness.  Hematological: Negative for adenopathy.  Psychiatric/Behavioral: Positive for agitation. Negative for behavioral problems. The patient is nervous/anxious.    Past Medical History  Diagnosis Date  . Hyperlipidemia   . Headache    Past Surgical History  Procedure Date  . Hernia repair   . Flexible sigmoidoscopy     reports that he has quit smoking. He does not have any smokeless tobacco history on file. He reports that he does not drink alcohol or use illicit drugs. family history includes COPD in an unspecified family member and Heart disease in an unspecified family member. Allergies not on file     Objective:   Physical Exam  Constitutional: He appears well-developed and  well-nourished.  HENT:  Head: Normocephalic and atraumatic.  Eyes: Conjunctivae and EOM are normal.  Neck: Normal range of motion. Neck supple.  Cardiovascular: Normal rate and regular rhythm.   Pulmonary/Chest: Effort normal and breath sounds normal.  Psychiatric: His affect is blunt. He is agitated and withdrawn. Thought content is not delusional. He expresses no suicidal plans and no homicidal plans.          Assessment & Plan:  The patient has a vivid recall of his experiences during the [place] War. Recently with increased communication with vetrans he has periods to a significant increase in recall of these events creating insomnia emotional lability and affected his ability to function. He meets the criteria of a posttraumatic stress disorder. I concur with this decision to seek evaluation through the [facility] and to consider available group therapy and  support

## 2011-05-14 ENCOUNTER — Ambulatory Visit (INDEPENDENT_AMBULATORY_CARE_PROVIDER_SITE_OTHER): Payer: BC Managed Care – PPO | Admitting: Internal Medicine

## 2011-05-14 DIAGNOSIS — Z2911 Encounter for prophylactic immunotherapy for respiratory syncytial virus (RSV): Secondary | ICD-10-CM

## 2011-05-14 DIAGNOSIS — Z0000 Encounter for general adult medical examination without abnormal findings: Secondary | ICD-10-CM

## 2011-10-04 ENCOUNTER — Ambulatory Visit (INDEPENDENT_AMBULATORY_CARE_PROVIDER_SITE_OTHER): Payer: BC Managed Care – PPO | Admitting: Internal Medicine

## 2011-10-04 DIAGNOSIS — Z23 Encounter for immunization: Secondary | ICD-10-CM

## 2012-06-30 ENCOUNTER — Encounter: Payer: Self-pay | Admitting: Internal Medicine

## 2012-06-30 ENCOUNTER — Ambulatory Visit (INDEPENDENT_AMBULATORY_CARE_PROVIDER_SITE_OTHER): Payer: BC Managed Care – PPO | Admitting: Internal Medicine

## 2012-06-30 VITALS — BP 150/90 | HR 72 | Temp 98.1°F | Resp 16 | Ht 70.0 in | Wt 184.0 lb

## 2012-06-30 DIAGNOSIS — K219 Gastro-esophageal reflux disease without esophagitis: Secondary | ICD-10-CM

## 2012-06-30 DIAGNOSIS — E785 Hyperlipidemia, unspecified: Secondary | ICD-10-CM

## 2012-06-30 DIAGNOSIS — R972 Elevated prostate specific antigen [PSA]: Secondary | ICD-10-CM

## 2012-06-30 DIAGNOSIS — R1310 Dysphagia, unspecified: Secondary | ICD-10-CM

## 2012-06-30 LAB — CBC WITH DIFFERENTIAL/PLATELET
Basophils Absolute: 0 10*3/uL (ref 0.0–0.1)
Basophils Relative: 0.9 % (ref 0.0–3.0)
Eosinophils Absolute: 0.1 10*3/uL (ref 0.0–0.7)
Eosinophils Relative: 1.5 % (ref 0.0–5.0)
HCT: 45.7 % (ref 39.0–52.0)
Hemoglobin: 14.9 g/dL (ref 13.0–17.0)
Lymphocytes Relative: 32.7 % (ref 12.0–46.0)
Lymphs Abs: 1.5 10*3/uL (ref 0.7–4.0)
MCHC: 32.6 g/dL (ref 30.0–36.0)
MCV: 93.2 fl (ref 78.0–100.0)
Monocytes Absolute: 0.4 10*3/uL (ref 0.1–1.0)
Monocytes Relative: 9.2 % (ref 3.0–12.0)
Neutro Abs: 2.6 10*3/uL (ref 1.4–7.7)
Neutrophils Relative %: 55.7 % (ref 43.0–77.0)
Platelets: 292 10*3/uL (ref 150.0–400.0)
RBC: 4.91 Mil/uL (ref 4.22–5.81)
RDW: 13.9 % (ref 11.5–14.6)
WBC: 4.6 10*3/uL (ref 4.5–10.5)

## 2012-06-30 LAB — PSA: PSA: 4.46 ng/mL — ABNORMAL HIGH (ref 0.10–4.00)

## 2012-06-30 LAB — TSH: TSH: 1.04 u[IU]/mL (ref 0.35–5.50)

## 2012-06-30 MED ORDER — DEXLANSOPRAZOLE 60 MG PO CPDR: 60.0000 mg | DELAYED_RELEASE_CAPSULE | Freq: Every day | ORAL | Status: DC

## 2012-06-30 NOTE — Progress Notes (Signed)
  Subjective:    Patient ID: Dan Dudley, male    DOB: October 29, 1946, 66 y.o.   MRN: [id number]  HPI Solid phase dysphagia Has GERD Monitoring stood normal color Has noted gerd for 6 months Moderate "hoase" voice No SOB No goiter   Review of Systems  Constitutional: Negative for fever and fatigue.  HENT: Negative for hearing loss, congestion, neck pain and postnasal drip.   Eyes: Negative for discharge, redness and visual disturbance.  Respiratory: Negative for cough, shortness of breath and wheezing.   Cardiovascular: Negative for leg swelling.  Gastrointestinal: Negative for abdominal pain, constipation and abdominal distention.       Solid phase dysphagia  Genitourinary: Negative for urgency and frequency.  Musculoskeletal: Negative for joint swelling and arthralgias.  Skin: Negative for color change and rash.  Neurological: Negative for weakness and light-headedness.  Hematological: Negative for adenopathy.  Psychiatric/Behavioral: Negative for behavioral problems.   Past Medical History  Diagnosis Date  . Hyperlipidemia   . Headache     History   Social History  . Marital Status: Married    Spouse Name: N/A    Number of Children: N/A  . Years of Education: N/A   Occupational History  . self employed    Social History Main Topics  . Smoking status: Former Games developer  . Smokeless tobacco: Not on file  . Alcohol Use: No  . Drug Use: No  . Sexually Active: Not on file   Other Topics Concern  . Not on file   Social History Narrative  . No narrative on file    Past Surgical History  Procedure Date  . Hernia repair   . Flexible sigmoidoscopy     Family History  Problem Relation Age of Onset  . COPD    . Heart disease      No Known Allergies  Current Outpatient Prescriptions on File Prior to Visit  Medication Sig Dispense Refill  . acetaminophen (TYLENOL) 325 MG tablet Take 650 mg by mouth every 6 (six) hours as needed.          BP 150/90   Pulse 72  Temp 98.1 F (36.7 C)  Resp 16  Ht 5\' 10"  (1.778 m)  Wt 184 lb (83.462 kg)  BMI 26.40 kg/m2       Objective:   Physical Exam  Constitutional: He appears well-developed and well-nourished.  HENT:  Head: Normocephalic and atraumatic.  Eyes: Conjunctivae are normal. Pupils are equal, round, and reactive to light.  Neck: Normal range of motion. Neck supple.  Cardiovascular: Normal rate and regular rhythm.   Pulmonary/Chest: Effort normal and breath sounds normal.  Abdominal: Soft. Bowel sounds are normal.          Assessment & Plan:  Pronounced solid phase dysphagia in the setting of reflux.  We will do a trial of a proton pump inhibitor for 4-5 weeks if there are significant breakthrough symptoms then we would ask him to call us sooner and set up an EGD will monitor back in 4-5 weeks if the symptoms completely resolve we will continue a proton pump inhibitor.  We will monitor today a CBC differential and a TSH and I believe he is due a PSA monitored

## 2012-06-30 NOTE — Patient Instructions (Addendum)
The patient is instructed to continue all medications as prescribed. Schedule followup with check out clerk upon leaving the clinic Take the   dexilant 1 tablet daily

## 2012-07-24 ENCOUNTER — Telehealth: Payer: Self-pay | Admitting: Internal Medicine

## 2012-07-24 NOTE — Telephone Encounter (Signed)
Ask patient to drop by and try samples of Zegerid

## 2012-07-24 NOTE — Telephone Encounter (Signed)
pls advise

## 2012-07-24 NOTE — Telephone Encounter (Signed)
Caller: [name]/Patient; Patient Name: Dan Dudley; PCP: Darryll Capers (Adults only); Best Callback Phone Number: [phone].  Call regarding substitute medication for Dexilant for Reflux.  Patient stopped Dexilant on 9-1 due to muscle aches and dizziness.  Per Patient muscle aches and dizziness have improved since stopping meds.  All emergent symptoms ruled out per Heartburn Protocol, see in 72 hours.  Patient last seen on 06-30-12. Patient uses [facility], [phone]. PLEASE FOLLOW UP WITH MD IF SUBSTITUTE CAN BE CALL IN.

## 2012-07-25 NOTE — Telephone Encounter (Signed)
Called pt and pt is aware samples are available.

## 2012-08-28 ENCOUNTER — Encounter: Payer: BC Managed Care – PPO | Admitting: Internal Medicine

## 2012-09-05 ENCOUNTER — Ambulatory Visit: Payer: BC Managed Care – PPO

## 2012-09-25 ENCOUNTER — Encounter: Payer: Self-pay | Admitting: Internal Medicine

## 2012-09-25 ENCOUNTER — Ambulatory Visit (INDEPENDENT_AMBULATORY_CARE_PROVIDER_SITE_OTHER): Payer: Medicare Other | Admitting: Internal Medicine

## 2012-09-25 VITALS — BP 110/74 | HR 76 | Temp 98.3°F | Resp 16 | Ht 70.0 in | Wt 180.0 lb

## 2012-09-25 DIAGNOSIS — Z23 Encounter for immunization: Secondary | ICD-10-CM

## 2012-09-25 DIAGNOSIS — K219 Gastro-esophageal reflux disease without esophagitis: Secondary | ICD-10-CM

## 2012-09-25 DIAGNOSIS — Z0000 Encounter for general adult medical examination without abnormal findings: Secondary | ICD-10-CM

## 2012-09-25 MED ORDER — RANITIDINE HCL 300 MG PO TABS: 300.0000 mg | ORAL_TABLET | Freq: Every day | ORAL | Status: DC

## 2012-09-25 NOTE — Progress Notes (Signed)
  Subjective:    Patient ID: Dan Dudley, male    DOB: 07-06-1946, 66 y.o.   MRN: [id number]  HPI CPX The patient could not tolerate the dexilant due to bowel effects and leg cramps The Prilosec did not do as well      Review of Systems  Constitutional: Negative for fever and fatigue.  HENT: Negative for hearing loss, congestion, neck pain and postnasal drip.   Eyes: Negative for discharge, redness and visual disturbance.  Respiratory: Negative for cough, shortness of breath and wheezing.   Cardiovascular: Negative for leg swelling.  Gastrointestinal: Negative for abdominal pain, constipation and abdominal distention.  Genitourinary: Negative for urgency and frequency.  Musculoskeletal: Negative for joint swelling and arthralgias.  Skin: Negative for color change and rash.  Neurological: Negative for weakness and light-headedness.  Hematological: Negative for adenopathy.  Psychiatric/Behavioral: Negative for behavioral problems.   Past Medical History  Diagnosis Date  . Hyperlipidemia   . Headache     History   Social History  . Marital Status: Married    Spouse Name: N/A    Number of Children: N/A  . Years of Education: N/A   Occupational History  . self employed    Social History Main Topics  . Smoking status: Former Games developer  . Smokeless tobacco: Not on file  . Alcohol Use: No  . Drug Use: No  . Sexually Active: Not on file   Other Topics Concern  . Not on file   Social History Narrative  . No narrative on file    Past Surgical History  Procedure Date  . Hernia repair   . Flexible sigmoidoscopy     Family History  Problem Relation Age of Onset  . COPD    . Heart disease      No Known Allergies  No current outpatient prescriptions on file prior to visit.    BP 110/74  Pulse 76  Temp 98.3 F (36.8 C)  Resp 16  Ht 5\' 10"  (1.778 m)  Wt 180 lb (81.647 kg)  BMI 25.83 kg/m2        Objective:   Physical Exam  Nursing note and  vitals reviewed. Constitutional: He is oriented to person, place, and time.  Abdominal: Soft. Bowel sounds are normal.  Musculoskeletal: Normal range of motion.  Neurological: He is alert and oriented to person, place, and time.  Skin: Skin is warm and dry.  Psychiatric: He has a normal mood and affect. His behavior is normal.          Assessment & Plan:   Patient presents for yearly preventative medicine examination.   all immunizations and health maintenance protocols were reviewed with the patient and they are up to date with these protocols.   screening laboratory values were reviewed with the patient including screening of hyperlipidemia PSA renal function and hepatic function.   There medications past medical history social history problem list and allergies were reviewed in detail.   Goals were established with regard to weight loss exercise diet in compliance with medications

## 2012-10-30 ENCOUNTER — Other Ambulatory Visit: Payer: Self-pay | Admitting: Internal Medicine

## 2012-10-30 MED ORDER — CELECOXIB 200 MG PO CAPS: 200.0000 mg | ORAL_CAPSULE | Freq: Two times a day (BID) | ORAL | Status: DC

## 2012-10-30 NOTE — Telephone Encounter (Signed)
Pt had samples requesting new rx celebrex call into [facility]

## 2012-10-30 NOTE — Telephone Encounter (Signed)
Ok per dr jenkins 

## 2013-01-08 ENCOUNTER — Other Ambulatory Visit: Payer: Self-pay | Admitting: *Deleted

## 2013-01-08 MED ORDER — CELECOXIB 200 MG PO CAPS: 200.0000 mg | ORAL_CAPSULE | Freq: Two times a day (BID) | ORAL | Status: DC

## 2013-03-30 ENCOUNTER — Encounter: Payer: Self-pay | Admitting: Internal Medicine

## 2013-03-30 ENCOUNTER — Ambulatory Visit (INDEPENDENT_AMBULATORY_CARE_PROVIDER_SITE_OTHER): Payer: Medicare Other | Admitting: Internal Medicine

## 2013-03-30 VITALS — BP 120/78 | HR 60 | Temp 98.3°F | Resp 16 | Ht 70.0 in | Wt 178.0 lb

## 2013-03-30 DIAGNOSIS — M25569 Pain in unspecified knee: Secondary | ICD-10-CM

## 2013-03-30 DIAGNOSIS — M5412 Radiculopathy, cervical region: Secondary | ICD-10-CM

## 2013-03-30 DIAGNOSIS — M5010 Cervical disc disorder with radiculopathy, unspecified cervical region: Secondary | ICD-10-CM

## 2013-03-30 NOTE — Progress Notes (Signed)
  Subjective:    Patient ID: Dan Dudley, male    DOB: [birth year], 67 y.o.   MRN: [id number]  Gastrophageal Reflux He reports no abdominal pain, no coughing or no wheezing. Pertinent negatives include no fatigue.   Mood stable Arthritis stable on the celebrex Stopped the zantac and takes rare tums no swallowing issues Has numbness in the right arm Driving and lifting No pain Awakens with numbness.     Review of Systems  Constitutional: Negative for fever and fatigue.  HENT: Positive for neck pain and neck stiffness. Negative for hearing loss, congestion and postnasal drip.   Eyes: Negative for discharge, redness and visual disturbance.  Respiratory: Negative for cough, shortness of breath and wheezing.   Cardiovascular: Negative for leg swelling.  Gastrointestinal: Negative for abdominal pain, constipation and abdominal distention.  Genitourinary: Negative for urgency and frequency.  Musculoskeletal: Negative for joint swelling and arthralgias.  Skin: Negative for color change and rash.  Neurological: Positive for numbness. Negative for weakness and light-headedness.  Hematological: Negative for adenopathy.  Psychiatric/Behavioral: Negative for behavioral problems.       Objective:   Physical Exam  Nursing note and vitals reviewed. Constitutional: He is oriented to person, place, and time. He appears well-developed and well-nourished.  HENT:  Head: Normocephalic and atraumatic.  Eyes: Conjunctivae are normal. Pupils are equal, round, and reactive to light.  Neck: Normal range of motion.  Tenderness and muscle spasm in the right cervical region  Cardiovascular: Normal rate and regular rhythm.   Pulmonary/Chest: Effort normal and breath sounds normal.  Abdominal: Soft. Bowel sounds are normal.  Musculoskeletal: He exhibits edema and tenderness.  Neurological: He is alert and oriented to person, place, and time.  Skin: Skin is warm and dry.  Psychiatric: He has  a normal mood and affect. His behavior is normal.          Assessment & Plan:  I suspect that the numbness and tingling in his right arm is cervical radiculopathy. We will start with a point injection and neck stretching exercises

## 2013-03-30 NOTE — Patient Instructions (Signed)

## 2013-05-25 ENCOUNTER — Encounter: Payer: Self-pay | Admitting: Family Medicine

## 2013-05-25 ENCOUNTER — Ambulatory Visit (INDEPENDENT_AMBULATORY_CARE_PROVIDER_SITE_OTHER): Payer: Medicare Other | Admitting: Family Medicine

## 2013-05-25 VITALS — BP 110/76 | Temp 98.1°F | Wt 177.0 lb

## 2013-05-25 DIAGNOSIS — J069 Acute upper respiratory infection, unspecified: Secondary | ICD-10-CM

## 2013-05-25 MED ORDER — HYDROCODONE-HOMATROPINE 5-1.5 MG/5ML PO SYRP: 5.0000 mL | ORAL_SOLUTION | Freq: Three times a day (TID) | ORAL | Status: DC | PRN

## 2013-05-25 NOTE — Progress Notes (Signed)
Chief Complaint  Patient presents with  . Sinus Problem    congestion, scratchy throat, cough    HPI:  Acute visit for sinus congestion: -started: 4 days ago - felt a little better yesterday, but cough -symptoms:nasal congestion, scratchy throat, cough, sinus pressure -denies:fever, SOB, NVD, tooth pain, strep or mono exposure -has tried: sudafed -sick contacts: none known -Hx of: sinus infection   ROS: See pertinent positives and negatives per HPI.  Past Medical History  Diagnosis Date  . Hyperlipidemia   . Headache(784.0)     Family History  Problem Relation Age of Onset  . COPD    . Heart disease      History   Social History  . Marital Status: Married    Spouse Name: N/A    Number of Children: N/A  . Years of Education: N/A   Occupational History  . self employed    Social History Main Topics  . Smoking status: Former Games developer  . Smokeless tobacco: None  . Alcohol Use: No  . Drug Use: No  . Sexually Active: None   Other Topics Concern  . None   Social History Narrative  . None    Current outpatient prescriptions:calcium carbonate (TUMS - DOSED IN MG ELEMENTAL CALCIUM) 500 MG chewable tablet, Chew 1 tablet by mouth daily as needed for heartburn., Disp: , Rfl: ;  celecoxib (CELEBREX) 200 MG capsule, Take 1 capsule (200 mg total) by mouth 2 (two) times daily., Disp: 60 capsule, Rfl: 3;  HYDROcodone-homatropine (HYCODAN) 5-1.5 MG/5ML syrup, Take 5 mLs by mouth every 8 (eight) hours as needed for cough., Disp: 120 mL, Rfl: 0  EXAM:  Filed Vitals:   05/25/13 0948  BP: 110/76  Temp: 98.1 F (36.7 C)    Body mass index is 25.4 kg/(m^2).  GENERAL: vitals reviewed and listed above, alert, oriented, appears well hydrated and in no acute distress  HEENT: atraumatic, conjunttiva clear, no obvious abnormalities on inspection of external nose and ears, normal appearance of ear canals and TMs, clear nasal congestion, mild post oropharyngeal erythema with PND,  no tonsillar edema or exudate, no sinus TTP  NECK: no obvious masses on inspection  LUNGS: clear to auscultation bilaterally, no wheezes, rales or rhonchi, good air movement  CV: HRRR, no peripheral edema  MS: moves all extremities without noticeable abnormality  PSYCH: pleasant and cooperative, no obvious depression or anxiety  ASSESSMENT AND PLAN:  Discussed the following assessment and plan:  Upper respiratory infection - Plan: HYDROcodone-homatropine (HYCODAN) 5-1.5 MG/5ML syrup  -discussed this is likely viral -Recommendations per orders and instructions, risks and use of medications and return precautions discussed. -Patient advised to return or notify a doctor immediately if symptoms worsen or persist or new concerns arise.  There are no Patient Instructions on file for this visit.   Kriste Basque R.

## 2013-05-25 NOTE — Patient Instructions (Addendum)
INSTRUCTIONS FOR UPPER RESPIRATORY INFECTION:  -plenty of rest and fluids  -nasal saline wash 2-3 times daily (use prepackaged nasal saline or bottled/distilled water if making your own)   -can use sinex and afrin  nasal spray for drainage and nasal congestion - but do NOT use longer then 3-4 days  -can use tylenol or ibuprofen as directed for aches and sorethroat  -if you are taking a cough medication - use only as directed, may also try a teaspoon of honey to coat the throat and throat lozenges  -for sore throat, salt water gargles can help  -follow up if you have fevers, facial pain, tooth pain, difficulty breathing or are worsening or not getting better in 5-7 days

## 2013-08-07 ENCOUNTER — Other Ambulatory Visit: Payer: Self-pay | Admitting: *Deleted

## 2013-08-07 MED ORDER — CELECOXIB 200 MG PO CAPS: 200.0000 mg | ORAL_CAPSULE | Freq: Two times a day (BID) | ORAL | Status: DC

## 2013-08-14 ENCOUNTER — Ambulatory Visit (INDEPENDENT_AMBULATORY_CARE_PROVIDER_SITE_OTHER): Payer: Medicare Other

## 2013-08-14 DIAGNOSIS — Z23 Encounter for immunization: Secondary | ICD-10-CM

## 2013-10-09 ENCOUNTER — Other Ambulatory Visit (INDEPENDENT_AMBULATORY_CARE_PROVIDER_SITE_OTHER): Payer: Medicare Other

## 2013-10-09 DIAGNOSIS — E785 Hyperlipidemia, unspecified: Secondary | ICD-10-CM

## 2013-10-09 DIAGNOSIS — Z0000 Encounter for general adult medical examination without abnormal findings: Secondary | ICD-10-CM

## 2013-10-09 LAB — CBC WITH DIFFERENTIAL/PLATELET
Basophils Absolute: 0.1 10*3/uL (ref 0.0–0.1)
Basophils Relative: 1.3 % (ref 0.0–3.0)
Eosinophils Absolute: 0.1 10*3/uL (ref 0.0–0.7)
Eosinophils Relative: 1.4 % (ref 0.0–5.0)
HCT: 44.5 % (ref 39.0–52.0)
Hemoglobin: 15 g/dL (ref 13.0–17.0)
Lymphocytes Relative: 30 % (ref 12.0–46.0)
Lymphs Abs: 1.6 10*3/uL (ref 0.7–4.0)
MCHC: 33.8 g/dL (ref 30.0–36.0)
MCV: 90.8 fl (ref 78.0–100.0)
Monocytes Absolute: 0.5 10*3/uL (ref 0.1–1.0)
Monocytes Relative: 8.6 % (ref 3.0–12.0)
Neutro Abs: 3.2 10*3/uL (ref 1.4–7.7)
Neutrophils Relative %: 58.7 % (ref 43.0–77.0)
Platelets: 374 10*3/uL (ref 150.0–400.0)
RBC: 4.9 Mil/uL (ref 4.22–5.81)
RDW: 13.8 % (ref 11.5–14.6)
WBC: 5.5 10*3/uL (ref 4.5–10.5)

## 2013-10-09 LAB — POCT URINALYSIS DIPSTICK
Bilirubin, UA: NEGATIVE
Blood, UA: NEGATIVE
Glucose, UA: NEGATIVE
Ketones, UA: NEGATIVE
Leukocytes, UA: NEGATIVE
Nitrite, UA: NEGATIVE
Protein, UA: NEGATIVE
Spec Grav, UA: 1.02
Urobilinogen, UA: 0.2
pH, UA: 7

## 2013-10-09 LAB — HEPATIC FUNCTION PANEL
ALT: 38 U/L (ref 0–53)
AST: 30 U/L (ref 0–37)
Albumin: 4.3 g/dL (ref 3.5–5.2)
Alkaline Phosphatase: 62 U/L (ref 39–117)
Bilirubin, Direct: 0.2 mg/dL (ref 0.0–0.3)
Total Bilirubin: 0.9 mg/dL (ref 0.3–1.2)
Total Protein: 6.8 g/dL (ref 6.0–8.3)

## 2013-10-09 LAB — BASIC METABOLIC PANEL
BUN: 18 mg/dL (ref 6–23)
CO2: 31 mEq/L (ref 19–32)
Calcium: 9.5 mg/dL (ref 8.4–10.5)
Chloride: 103 mEq/L (ref 96–112)
Creatinine, Ser: 0.8 mg/dL (ref 0.4–1.5)
GFR: 100.43 mL/min (ref >60.00)
Glucose, Bld: 92 mg/dL (ref 70–99)
Potassium: 4.1 mEq/L (ref 3.5–5.1)
Sodium: 138 mEq/L (ref 135–145)

## 2013-10-09 LAB — LIPID PANEL
Cholesterol: 191 mg/dL (ref 0–200)
HDL: 64.7 mg/dL (ref >39.00)
LDL Cholesterol: 118 mg/dL — ABNORMAL HIGH (ref 0–99)
Total CHOL/HDL Ratio: 3
Triglycerides: 41 mg/dL (ref 0.0–149.0)
VLDL: 8.2 mg/dL (ref 0.0–40.0)

## 2013-10-09 LAB — PSA: PSA: 6.17 ng/mL — ABNORMAL HIGH (ref 0.10–4.00)

## 2013-10-09 LAB — TSH: TSH: 0.87 u[IU]/mL (ref 0.35–5.50)

## 2013-10-16 ENCOUNTER — Encounter: Payer: Medicare Other | Admitting: Internal Medicine

## 2013-10-19 ENCOUNTER — Encounter: Payer: Self-pay | Admitting: Internal Medicine

## 2013-10-19 ENCOUNTER — Ambulatory Visit (INDEPENDENT_AMBULATORY_CARE_PROVIDER_SITE_OTHER): Payer: Medicare Other | Admitting: Internal Medicine

## 2013-10-19 VITALS — BP 140/88 | HR 68 | Temp 98.3°F | Ht 72.0 in | Wt 179.0 lb

## 2013-10-19 DIAGNOSIS — Z23 Encounter for immunization: Secondary | ICD-10-CM

## 2013-10-19 DIAGNOSIS — N401 Enlarged prostate with lower urinary tract symptoms: Secondary | ICD-10-CM

## 2013-10-19 DIAGNOSIS — Z0000 Encounter for general adult medical examination without abnormal findings: Secondary | ICD-10-CM

## 2013-10-19 DIAGNOSIS — R972 Elevated prostate specific antigen [PSA]: Secondary | ICD-10-CM

## 2013-10-19 MED ORDER — CIPROFLOXACIN HCL 500 MG PO TABS: 500.0000 mg | ORAL_TABLET | Freq: Two times a day (BID) | ORAL | Status: DC

## 2013-10-19 NOTE — Progress Notes (Signed)
   Subjective:    Patient ID: Dan Dudley, male    DOB: 04-09-1946, 67 y.o.   MRN: [id number]  HPI Is a healthy 67 year old male presents for his annual examination.  His a history of elevated PSA and at this time his PSA is elevated from his baseline PSA about 4-1/2 to greater than 6.  He does have mild nocturia he does have a history of BPH   Review of Systems  Constitutional: Negative for fever and fatigue.  HENT: Negative for congestion, hearing loss and postnasal drip.   Eyes: Negative for discharge, redness and visual disturbance.  Respiratory: Negative for cough, shortness of breath and wheezing.   Cardiovascular: Negative for leg swelling.  Gastrointestinal: Negative for abdominal pain, constipation and abdominal distention.  Genitourinary: Positive for urgency. Negative for frequency.       Nocturia  Musculoskeletal: Positive for arthralgias, joint swelling, myalgias and neck pain.  Skin: Negative for color change and rash.  Neurological: Negative for weakness and light-headedness.  Hematological: Negative for adenopathy.  Psychiatric/Behavioral: Negative for behavioral problems.       Objective:   Physical Exam  Nursing note and vitals reviewed. Constitutional: He is oriented to person, place, and time. He appears well-developed and well-nourished.  HENT:  Head: Normocephalic and atraumatic.  Eyes: Conjunctivae are normal. Pupils are equal, round, and reactive to light.  Neck: Normal range of motion. Neck supple.  Cardiovascular: Normal rate and regular rhythm.   Pulmonary/Chest: Effort normal and breath sounds normal.  Abdominal: Soft. Bowel sounds are normal.  Genitourinary:  Large prostate with bogginess in the left lobe no appreciable masses  Musculoskeletal:  Tenderness of left shoulder limited range of motion due to persistent pain  Neurological: He is alert and oriented to person, place, and time.  Skin: Skin is warm and dry.            Assessment & Plan:   Patient presents for yearly preventative medicine examination.   all immunizations and health maintenance protocols were reviewed with the patient and they are up to date with these protocols.   screening laboratory values were reviewed with the patient including screening of hyperlipidemia PSA renal function and hepatic function.   There medications past medical history social history problem list and allergies were reviewed in detail.   Goals were established with regard to weight loss exercise diet in compliance with medications  Patient a PSA with boggy tender prostate with complete history with antibiotics for 3 weeks and repeat PSA in 2 months with a free and total PSA if PSA remains elevated we have discussed referring him to Dr. Patsi Sears for consideration for biopsies

## 2013-10-19 NOTE — Patient Instructions (Signed)
The patient is instructed to continue all medications as prescribed. Schedule followup with check out clerk upon leaving the clinic Be sure to have PSA checked in 2 months

## 2013-10-19 NOTE — Progress Notes (Signed)
Pre visit review using our clinic review tool, if applicable. No additional management support is needed unless otherwise documented below in the visit note. 

## 2013-10-19 NOTE — Addendum Note (Signed)
Addended by: Stacie Glaze on: 10/19/2013 03:23 PM   Modules accepted: Orders

## 2013-12-21 ENCOUNTER — Other Ambulatory Visit: Payer: Medicare Other

## 2013-12-21 DIAGNOSIS — N401 Enlarged prostate with lower urinary tract symptoms: Secondary | ICD-10-CM

## 2013-12-21 DIAGNOSIS — R972 Elevated prostate specific antigen [PSA]: Secondary | ICD-10-CM

## 2013-12-21 DIAGNOSIS — R351 Nocturia: Secondary | ICD-10-CM

## 2013-12-22 LAB — PSA, TOTAL AND FREE
PSA, Free Pct: 22 % — ABNORMAL LOW (ref >25)
PSA, Free: 0.98 ng/mL
PSA: 4.5 ng/mL — ABNORMAL HIGH (ref <=4.00)

## 2013-12-23 ENCOUNTER — Encounter: Payer: Self-pay | Admitting: Internal Medicine

## 2013-12-23 DIAGNOSIS — R972 Elevated prostate specific antigen [PSA]: Principal | ICD-10-CM

## 2013-12-23 DIAGNOSIS — N400 Enlarged prostate without lower urinary tract symptoms: Secondary | ICD-10-CM

## 2013-12-29 ENCOUNTER — Other Ambulatory Visit: Payer: Medicare Other

## 2014-06-22 ENCOUNTER — Other Ambulatory Visit (INDEPENDENT_AMBULATORY_CARE_PROVIDER_SITE_OTHER): Payer: Medicare Other

## 2014-06-22 DIAGNOSIS — R972 Elevated prostate specific antigen [PSA]: Secondary | ICD-10-CM

## 2014-06-22 DIAGNOSIS — N400 Enlarged prostate without lower urinary tract symptoms: Secondary | ICD-10-CM

## 2014-06-23 LAB — PSA, TOTAL AND FREE
PSA, Free Pct: 20 % — ABNORMAL LOW (ref >25)
PSA, Free: 1.2 ng/mL
PSA: 5.95 ng/mL — ABNORMAL HIGH (ref <=4.00)

## 2014-07-07 ENCOUNTER — Encounter: Payer: Self-pay | Admitting: Internal Medicine

## 2014-08-13 ENCOUNTER — Encounter: Payer: Self-pay | Admitting: Family Medicine

## 2014-08-13 ENCOUNTER — Ambulatory Visit (INDEPENDENT_AMBULATORY_CARE_PROVIDER_SITE_OTHER): Payer: Medicare Other | Admitting: Family Medicine

## 2014-08-13 VITALS — BP 122/78 | HR 60 | Temp 98.2°F | Wt 188.0 lb

## 2014-08-13 DIAGNOSIS — Z0000 Encounter for general adult medical examination without abnormal findings: Secondary | ICD-10-CM

## 2014-08-13 DIAGNOSIS — R972 Elevated prostate specific antigen [PSA]: Secondary | ICD-10-CM

## 2014-08-13 DIAGNOSIS — E785 Hyperlipidemia, unspecified: Secondary | ICD-10-CM

## 2014-08-13 DIAGNOSIS — Z23 Encounter for immunization: Secondary | ICD-10-CM

## 2014-08-13 DIAGNOSIS — K219 Gastro-esophageal reflux disease without esophagitis: Secondary | ICD-10-CM

## 2014-08-13 DIAGNOSIS — R351 Nocturia: Secondary | ICD-10-CM

## 2014-08-13 DIAGNOSIS — IMO0001 Reserved for inherently not codable concepts without codable children: Secondary | ICD-10-CM

## 2014-08-13 DIAGNOSIS — N401 Enlarged prostate with lower urinary tract symptoms: Secondary | ICD-10-CM

## 2014-08-13 MED ORDER — CIPROFLOXACIN HCL 500 MG PO TABS: 500.0000 mg | ORAL_TABLET | Freq: Two times a day (BID) | ORAL | Status: DC

## 2014-08-13 NOTE — Patient Instructions (Addendum)
Take ciprofloxacin for 4 weeks. In late November (after the 25th) let's repeat your PSA. We will make a plan after that. Id like to see you 6 months from today  Cholesterol-recommend at least once a week cholesterol medicine. You decided wanted to hold off to recalculate 10 year risk from November labs.   Flu shot today  Schedule lab visit for 2 months

## 2014-08-13 NOTE — Progress Notes (Signed)
Dan Reddish, MD Phone: [phone]  Subjective:  Patient presents today to establish care with me as their new primary care provider. Patient was formerly a patient of Dr. Arnoldo Morale. Chief complaint-noted.   Hyperlipidemia-poor control  Lab Results  Component Value Date   LDLCALC 118* 10/09/2013  10 year risk: 13% On statin: no ROS- no chest pain or shortness of breath. No myalgias  BPH with nocturia/elevated PSA Symptoms have not worsened with BPH. Still 2x nightly nocturia. Elevated PSA near 6 from baseline 4.5 last visit. Has been on antibiotic courses before with ciprofloxacin courses. ROS-nocturia 2x a night, no dysuria, no bone pain, no fever/night sweats  GERD Well controlled with omeprazole ROS-no burping, no nausea  The following were reviewed and entered/updated in epic: Past Medical History  Diagnosis Date  . Hyperlipidemia   . INOMVEHM(094.7)    Patient Active Problem List   Diagnosis Date Noted  . PSA, INCREASED 04/03/2010    Priority: High  . BPH associated with nocturia 08/15/2014    Priority: Medium  . HYPERLIPIDEMIA 07/22/2007    Priority: Medium  . GERD (gastroesophageal reflux disease) 08/13/2014    Priority: Low  . PTSD (post-traumatic stress disorder) 05/09/2011    Priority: Low  . BURSITIS, LEFT SHOULDER 01/31/2010    Priority: Low  . Headache(784.0) 07/22/2007    Priority: Low   Past Surgical History  Procedure Laterality Date  . Hernia repair      x3  . Flexible sigmoidoscopy      Family History  Problem Relation Age of Onset  . COPD    . Heart disease Father     age 17, nonsmoker, heavy drinker    Medications- reviewed and updated Current Outpatient Prescriptions  Medication Sig Dispense Refill  . aspirin 81 MG tablet Take 81 mg by mouth daily.      . calcium carbonate (TUMS - DOSED IN MG ELEMENTAL CALCIUM) 500 MG chewable tablet Chew 1 tablet by mouth daily as needed for heartburn.      . ciprofloxacin (CIPRO) 500 MG tablet  Take 1 tablet (500 mg total) by mouth 2 (two) times daily.  56 tablet  0   No current facility-administered medications for this visit.    Allergies-reviewed and updated No Known Allergies  History   Social History  . Marital Status: Married    Spouse Name: N/A    Number of Children: N/A  . Years of Education: N/A   Occupational History  . self employed    Social History Main Topics  . Smoking status: Former Smoker -- 0.25 packs/day for 8 years    Types: Cigarettes    Quit date: 11/19/1968  . Smokeless tobacco: None  . Alcohol Use: No  . Drug Use: No  . Sexual Activity: None   Other Topics Concern  . None   Social History Narrative   Married [place] 2 kids. Randall Hiss lives in [place], [place] -bachelor and Radio producer; Katharine Look in [place] with a boy and girl (21 in 2015).    Missy-toy poodle (9).       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 Oropharynx normal. Several crowns on back teeth. Good dentition Neck: no thyromegaly CV: RRR no murmurs rubs or gallops Lungs: CTAB  no crackles, wheeze, rhonchi Abdomen: soft/nontender/nondistended/normal bowel sounds.  Ext: no edema, 2+ PT pulses Skin: warm, dry, no rash Neuro: grossly normal, moves all extremities, PERRLA  Assessment/Plan:  BPH associated with nocturia Symptoms reasonable for patient without medication. PSA possibly related to BPH. Will do prostate exam in 6 months if has not been referred to urology at that point.   PSA, INCREASED Ciprofloxacin x 4 weeks for possible prostatitis as cause of elevated PSA. Recheck in 8 weeks.   HYPERLIPIDEMIA 10 year risk 13%. Discussed likely would need statin at least once weekly. Patient would prefer to wait until next set of labs and recalculate risk  before decision  GERD (gastroesophageal reflux disease) Well controlled on omeprazole. Continue.    Orders Placed This Encounter  Procedures  . CBC    [facility]    Standing Status: Future     Number of Occurrences:      Standing Expiration Date: 11/18/2014  . Comprehensive metabolic panel    [place]    Standing Status: Future     Number of Occurrences:      Standing Expiration Date: 11/18/2014  . PSA, Total and Free    Standing Status: Future     Number of Occurrences:      Standing Expiration Date: 11/18/2014  . Lipid panel    [place]    Standing Status: Future     Number of Occurrences:      Standing Expiration Date: 11/18/2014  . TSH    [place]    Standing Status: Future     Number of Occurrences:      Standing Expiration Date: 11/18/2014    Meds ordered this encounter  Medications  . ciprofloxacin (CIPRO) 500 MG tablet    Sig: Take 1 tablet (500 mg total) by mouth 2 (two) times daily.    Dispense:  56 tablet    Refill:  0  . aspirin 81 MG tablet    Sig: Take 81 mg by mouth daily.

## 2014-08-15 DIAGNOSIS — R351 Nocturia: Secondary | ICD-10-CM

## 2014-08-15 DIAGNOSIS — N401 Enlarged prostate with lower urinary tract symptoms: Secondary | ICD-10-CM | POA: Insufficient documentation

## 2014-08-15 NOTE — Assessment & Plan Note (Signed)
Well controlled on omeprazole. Continue.

## 2014-08-15 NOTE — Assessment & Plan Note (Signed)
10 year risk 13%. Discussed likely would need statin at least once weekly. Patient would prefer to wait until next set of labs and recalculate risk before decision

## 2014-08-15 NOTE — Assessment & Plan Note (Signed)
Ciprofloxacin x 4 weeks for possible prostatitis as cause of elevated PSA. Recheck in 8 weeks.

## 2014-08-15 NOTE — Assessment & Plan Note (Signed)
Symptoms reasonable for patient without medication. PSA possibly related to BPH. Will do prostate exam in 6 months if has not been referred to urology at that point.

## 2014-08-19 ENCOUNTER — Other Ambulatory Visit: Payer: Medicare Other | Admitting: Family Medicine

## 2014-08-29 ENCOUNTER — Encounter: Payer: Self-pay | Admitting: Family Medicine

## 2014-08-30 MED ORDER — LEVOFLOXACIN 500 MG PO TABS: 500.0000 mg | ORAL_TABLET | Freq: Every day | ORAL | Status: DC

## 2014-10-18 ENCOUNTER — Other Ambulatory Visit (INDEPENDENT_AMBULATORY_CARE_PROVIDER_SITE_OTHER): Payer: Medicare Other

## 2014-10-18 DIAGNOSIS — IMO0001 Reserved for inherently not codable concepts without codable children: Secondary | ICD-10-CM

## 2014-10-18 DIAGNOSIS — N401 Enlarged prostate with lower urinary tract symptoms: Secondary | ICD-10-CM

## 2014-10-18 DIAGNOSIS — R972 Elevated prostate specific antigen [PSA]: Secondary | ICD-10-CM

## 2014-10-18 DIAGNOSIS — E785 Hyperlipidemia, unspecified: Secondary | ICD-10-CM

## 2014-10-18 DIAGNOSIS — Z0000 Encounter for general adult medical examination without abnormal findings: Secondary | ICD-10-CM

## 2014-10-18 DIAGNOSIS — R351 Nocturia: Secondary | ICD-10-CM

## 2014-10-18 LAB — COMPREHENSIVE METABOLIC PANEL
ALT: 34 U/L (ref 0–53)
AST: 29 U/L (ref 0–37)
Albumin: 4.5 g/dL (ref 3.5–5.2)
Alkaline Phosphatase: 56 U/L (ref 39–117)
BUN: 16 mg/dL (ref 6–23)
CO2: 26 mEq/L (ref 19–32)
Calcium: 9.3 mg/dL (ref 8.4–10.5)
Chloride: 103 mEq/L (ref 96–112)
Creatinine, Ser: 0.9 mg/dL (ref 0.4–1.5)
GFR: 87.54 mL/min (ref >60.00)
Glucose, Bld: 92 mg/dL (ref 70–99)
Potassium: 4.3 mEq/L (ref 3.5–5.1)
Sodium: 137 mEq/L (ref 135–145)
Total Bilirubin: 1.2 mg/dL (ref 0.2–1.2)
Total Protein: 7 g/dL (ref 6.0–8.3)

## 2014-10-18 LAB — CBC
HCT: 48 % (ref 39.0–52.0)
Hemoglobin: 15.4 g/dL (ref 13.0–17.0)
MCHC: 32.2 g/dL (ref 30.0–36.0)
MCV: 92.8 fl (ref 78.0–100.0)
Platelets: 330 10*3/uL (ref 150.0–400.0)
RBC: 5.17 Mil/uL (ref 4.22–5.81)
RDW: 14.4 % (ref 11.5–15.5)
WBC: 5.7 10*3/uL (ref 4.0–10.5)

## 2014-10-18 LAB — LIPID PANEL
CHOL/HDL RATIO: 3
Cholesterol: 222 mg/dL — ABNORMAL HIGH (ref 0–200)
HDL: 64.8 mg/dL (ref >39.00)
LDL Cholesterol: 147 mg/dL — ABNORMAL HIGH (ref 0–99)
NONHDL: 157.2
TRIGLYCERIDES: 50 mg/dL (ref 0.0–149.0)
VLDL: 10 mg/dL (ref 0.0–40.0)

## 2014-10-18 LAB — TSH: TSH: 1.07 u[IU]/mL (ref 0.35–4.50)

## 2014-10-19 LAB — PSA, TOTAL AND FREE
PSA, Free Pct: 20 % — ABNORMAL LOW (ref >25)
PSA, Free: 1.14 ng/mL
PSA: 5.68 ng/mL — ABNORMAL HIGH (ref <=4.00)

## 2014-10-19 NOTE — Addendum Note (Signed)
Addended by: Clyde Lundborg A on: 10/19/2014 10:11 AM   Modules accepted: Orders

## 2015-05-16 ENCOUNTER — Other Ambulatory Visit: Payer: Self-pay

## 2015-08-15 ENCOUNTER — Telehealth: Payer: Self-pay | Admitting: Family Medicine

## 2015-08-15 NOTE — Telephone Encounter (Signed)
I went ahead and scheduled pt for his hep c screening on 08/17/15. Need the order to be placed in epic

## 2015-08-16 ENCOUNTER — Other Ambulatory Visit: Payer: Self-pay | Admitting: Family Medicine

## 2015-08-16 DIAGNOSIS — Z1159 Encounter for screening for other viral diseases: Secondary | ICD-10-CM

## 2015-08-16 NOTE — Telephone Encounter (Signed)
Lab order entered.

## 2015-08-17 ENCOUNTER — Ambulatory Visit (INDEPENDENT_AMBULATORY_CARE_PROVIDER_SITE_OTHER): Payer: Medicare Other | Admitting: Family Medicine

## 2015-08-17 ENCOUNTER — Other Ambulatory Visit (INDEPENDENT_AMBULATORY_CARE_PROVIDER_SITE_OTHER): Payer: Self-pay

## 2015-08-17 DIAGNOSIS — Z1159 Encounter for screening for other viral diseases: Secondary | ICD-10-CM

## 2015-08-17 DIAGNOSIS — Z23 Encounter for immunization: Secondary | ICD-10-CM

## 2015-08-18 LAB — HEPATITIS C ANTIBODY: HCV Ab: NEGATIVE

## 2016-02-15 ENCOUNTER — Encounter: Payer: Self-pay | Admitting: Gastroenterology

## 2016-02-21 ENCOUNTER — Telehealth: Payer: Self-pay | Admitting: Family Medicine

## 2016-02-21 NOTE — Telephone Encounter (Signed)
Patient is requesting to transfer from [place] to [place] because Dan Dudley is closer to home

## 2016-02-21 NOTE — Telephone Encounter (Signed)
Nice guy. Fine with me.

## 2016-02-21 NOTE — Telephone Encounter (Signed)
Ok with me 

## 2016-02-22 NOTE — Telephone Encounter (Signed)
Left patient vm to call back to make appt to transfer to [facility].

## 2016-03-07 DIAGNOSIS — R69 Illness, unspecified: Secondary | ICD-10-CM | POA: Diagnosis not present

## 2016-04-02 ENCOUNTER — Encounter: Payer: Medicare Other | Admitting: Internal Medicine

## 2016-04-23 ENCOUNTER — Ambulatory Visit (INDEPENDENT_AMBULATORY_CARE_PROVIDER_SITE_OTHER): Payer: Medicare HMO | Admitting: Internal Medicine

## 2016-04-23 ENCOUNTER — Other Ambulatory Visit (INDEPENDENT_AMBULATORY_CARE_PROVIDER_SITE_OTHER): Payer: Medicare HMO

## 2016-04-23 ENCOUNTER — Encounter: Payer: Self-pay | Admitting: Internal Medicine

## 2016-04-23 VITALS — BP 140/100 | HR 65 | Temp 97.9°F | Ht 72.0 in | Wt 185.0 lb

## 2016-04-23 DIAGNOSIS — E785 Hyperlipidemia, unspecified: Secondary | ICD-10-CM | POA: Diagnosis not present

## 2016-04-23 DIAGNOSIS — R351 Nocturia: Secondary | ICD-10-CM

## 2016-04-23 DIAGNOSIS — Z0000 Encounter for general adult medical examination without abnormal findings: Secondary | ICD-10-CM | POA: Diagnosis not present

## 2016-04-23 DIAGNOSIS — I10 Essential (primary) hypertension: Secondary | ICD-10-CM | POA: Insufficient documentation

## 2016-04-23 DIAGNOSIS — R972 Elevated prostate specific antigen [PSA]: Secondary | ICD-10-CM

## 2016-04-23 DIAGNOSIS — H9190 Unspecified hearing loss, unspecified ear: Secondary | ICD-10-CM | POA: Insufficient documentation

## 2016-04-23 DIAGNOSIS — N401 Enlarged prostate with lower urinary tract symptoms: Secondary | ICD-10-CM

## 2016-04-23 DIAGNOSIS — H9193 Unspecified hearing loss, bilateral: Secondary | ICD-10-CM

## 2016-04-23 LAB — COMPREHENSIVE METABOLIC PANEL
ALBUMIN: 4.8 g/dL (ref 3.5–5.2)
ALT: 18 U/L (ref 0–53)
AST: 16 U/L (ref 0–37)
Alkaline Phosphatase: 50 U/L (ref 39–117)
BUN: 18 mg/dL (ref 6–23)
CALCIUM: 9.7 mg/dL (ref 8.4–10.5)
CHLORIDE: 105 meq/L (ref 96–112)
CO2: 28 mEq/L (ref 19–32)
CREATININE: 0.95 mg/dL (ref 0.40–1.50)
GFR: 82.94 mL/min (ref >60.00)
Glucose, Bld: 100 mg/dL — ABNORMAL HIGH (ref 70–99)
Potassium: 4.2 mEq/L (ref 3.5–5.1)
Sodium: 140 mEq/L (ref 135–145)
Total Bilirubin: 1.2 mg/dL (ref 0.2–1.2)
Total Protein: 7.2 g/dL (ref 6.0–8.3)

## 2016-04-23 LAB — CBC WITH DIFFERENTIAL/PLATELET
BASOS ABS: 0 10*3/uL (ref 0.0–0.1)
BASOS PCT: 0.7 % (ref 0.0–3.0)
EOS ABS: 0.1 10*3/uL (ref 0.0–0.7)
Eosinophils Relative: 1.9 % (ref 0.0–5.0)
HEMATOCRIT: 46.7 % (ref 39.0–52.0)
HEMOGLOBIN: 15.7 g/dL (ref 13.0–17.0)
LYMPHS PCT: 26.2 % (ref 12.0–46.0)
Lymphs Abs: 1.8 10*3/uL (ref 0.7–4.0)
MCHC: 33.6 g/dL (ref 30.0–36.0)
MCV: 90.5 fl (ref 78.0–100.0)
Monocytes Absolute: 0.5 10*3/uL (ref 0.1–1.0)
Monocytes Relative: 7.6 % (ref 3.0–12.0)
Neutro Abs: 4.3 10*3/uL (ref 1.4–7.7)
Neutrophils Relative %: 63.6 % (ref 43.0–77.0)
Platelets: 342 10*3/uL (ref 150.0–400.0)
RBC: 5.17 Mil/uL (ref 4.22–5.81)
RDW: 13.9 % (ref 11.5–15.5)
WBC: 6.8 10*3/uL (ref 4.0–10.5)

## 2016-04-23 LAB — URINALYSIS, ROUTINE W REFLEX MICROSCOPIC
Bilirubin Urine: NEGATIVE
HGB URINE DIPSTICK: NEGATIVE
Ketones, ur: NEGATIVE
Leukocytes, UA: NEGATIVE
Nitrite: NEGATIVE
PH: 5.5 (ref 5.0–8.0)
RBC / HPF: NONE SEEN (ref 0–?)
SPECIFIC GRAVITY, URINE: 1.025 (ref 1.000–1.030)
TOTAL PROTEIN, URINE-UPE24: NEGATIVE
URINE GLUCOSE: NEGATIVE
UROBILINOGEN UA: 0.2 (ref 0.0–1.0)

## 2016-04-23 LAB — TSH: TSH: 0.75 u[IU]/mL (ref 0.35–4.50)

## 2016-04-23 LAB — FECAL OCCULT BLOOD, GUAIAC: Fecal Occult Blood: NEGATIVE

## 2016-04-23 LAB — LIPID PANEL
CHOLESTEROL: 238 mg/dL — AB (ref 0–200)
HDL: 68.5 mg/dL (ref >39.00)
LDL CALC: 156 mg/dL — AB (ref 0–99)
NonHDL: 169.66
TRIGLYCERIDES: 68 mg/dL (ref 0.0–149.0)
Total CHOL/HDL Ratio: 3
VLDL: 13.6 mg/dL (ref 0.0–40.0)

## 2016-04-23 MED ORDER — TELMISARTAN 40 MG PO TABS: 40.0000 mg | ORAL_TABLET | Freq: Every day | ORAL | Status: DC

## 2016-04-23 NOTE — Progress Notes (Signed)
Subjective:  Patient ID: Dan Dudley, male    DOB: November 09, 1946  Age: 70 y.o. MRN: [id number]  CC: Annual Exam; Hyperlipidemia; and Hypertension   NEW TO ME  HPI Dan Dudley presents for a CPX.  He complains of chronic hearing loss and ringing in both ears and wants to have his hearing tested.  He has a history of hypertension but tells me he thinks it's only been white coat hypertension. His blood pressure is about 120 to 1:30 at home.  He has a history of BPH and elevated PSA. He tells me that about a year ago he underwent some prostate biopsies that were all negative for prostate cancer. He is followed every 6-8 months by Dr. Janice Norrie of urology.  History Dan Dudley has a past medical history of Hyperlipidemia and Headache(784.0).   He has past surgical history that includes Hernia repair and Flexible sigmoidoscopy.   His family history includes Heart disease in his father.He reports that he quit smoking about 47 years ago. His smoking use included Cigarettes. He has a 2 pack-year smoking history. He does not have any smokeless tobacco history on file. He reports that he does not drink alcohol or use illicit drugs.  Outpatient Prescriptions Prior to Visit  Medication Sig Dispense Refill  . aspirin 81 MG tablet Take 81 mg by mouth daily.    . calcium carbonate (TUMS - DOSED IN MG ELEMENTAL CALCIUM) 500 MG chewable tablet Chew 1 tablet by mouth daily as needed for heartburn.    . levofloxacin (LEVAQUIN) 500 MG tablet Take 1 tablet (500 mg total) by mouth daily. 28 tablet 0   No facility-administered medications prior to visit.    ROS Review of Systems  Constitutional: Negative.   HENT: Positive for hearing loss and tinnitus. Negative for ear pain and facial swelling.   Eyes: Negative.  Negative for visual disturbance.  Respiratory: Negative.  Negative for cough, choking, chest tightness, shortness of breath and stridor.   Cardiovascular: Negative.  Negative  for chest pain, palpitations and leg swelling.  Gastrointestinal: Negative.  Negative for nausea, vomiting, abdominal pain, diarrhea, constipation and blood in stool.  Endocrine: Negative.   Genitourinary: Negative.  Negative for dysuria, urgency, frequency, hematuria, flank pain, scrotal swelling, enuresis, difficulty urinating and testicular pain.       He complains of nocturia about 2-3 times per night but he wishes for this not to be treated  Musculoskeletal: Negative.  Negative for myalgias, back pain, joint swelling and arthralgias.  Skin: Negative.  Negative for color change and rash.  Allergic/Immunologic: Negative.   Neurological: Negative.  Negative for dizziness, syncope, weakness and light-headedness.  Hematological: Negative.  Negative for adenopathy. Does not bruise/bleed easily.  Psychiatric/Behavioral: Negative.     Objective:  BP 140/100 mmHg  Pulse 65  Temp(Src) 97.9 F (36.6 C) (Oral)  Ht 6' (1.829 m)  Wt 185 lb (83.915 kg)  BMI 25.08 kg/m2  SpO2 95%  Physical Exam  Constitutional: He is oriented to person, place, and time. No distress.  HENT:  Right Ear: Hearing, tympanic membrane, external ear and ear canal normal.  Left Ear: Hearing, tympanic membrane, external ear and ear canal normal.  Mouth/Throat: Oropharynx is clear and moist. No oropharyngeal exudate.  Eyes: Conjunctivae are normal. Right eye exhibits no discharge. Left eye exhibits no discharge. No scleral icterus.  Neck: Normal range of motion. Neck supple. No JVD present. No tracheal deviation present. No thyromegaly present.  Cardiovascular: Normal rate, regular rhythm, normal heart  sounds and intact distal pulses.  Exam reveals no gallop and no friction rub.   No murmur heard. Pulses:      Carotid pulses are 1+ on the right side, and 1+ on the left side.      Radial pulses are 1+ on the right side, and 1+ on the left side.       Femoral pulses are 1+ on the right side, and 1+ on the left side.       Popliteal pulses are 1+ on the right side, and 1+ on the left side.       Dorsalis pedis pulses are 1+ on the right side, and 1+ on the left side.       Posterior tibial pulses are 1+ on the right side, and 1+ on the left side.  EKG ----  Sinus  Bradycardia  WITHIN NORMAL LIMITS  Pulmonary/Chest: Effort normal and breath sounds normal. No stridor. No respiratory distress. He has no wheezes. He has no rales. He exhibits no tenderness.  Abdominal: Soft. Bowel sounds are normal. He exhibits no distension and no mass. There is no tenderness. There is no rebound and no guarding. Hernia confirmed negative in the right inguinal area and confirmed negative in the left inguinal area.  Genitourinary: Testes normal and penis normal. Rectal exam shows external hemorrhoid. Rectal exam shows no internal hemorrhoid, no fissure, no mass, no tenderness and anal tone normal. Guaiac negative stool. Prostate is enlarged (2+ smooth symm BPH). Prostate is not tender. Right testis shows no mass, no swelling and no tenderness. Right testis is descended. Left testis shows no mass, no swelling and no tenderness. Left testis is descended. Circumcised. No penile erythema or penile tenderness. No discharge found.  Musculoskeletal: Normal range of motion. He exhibits no edema or tenderness.  Lymphadenopathy:    He has no cervical adenopathy.       Right: No inguinal adenopathy present.       Left: No inguinal adenopathy present.  Neurological: He is oriented to person, place, and time.  Skin: Skin is warm and dry. No rash noted. He is not diaphoretic. No erythema. No pallor.  Psychiatric: He has a normal mood and affect. His behavior is normal. Judgment and thought content normal.  Vitals reviewed.   Lab Results  Component Value Date   WBC 6.8 04/23/2016   HGB 15.7 04/23/2016   HCT 46.7 04/23/2016   PLT 342.0 04/23/2016   GLUCOSE 100* 04/23/2016   CHOL 238* 04/23/2016   TRIG 68.0 04/23/2016   HDL 68.50 04/23/2016     LDLDIRECT 134.3 03/27/2010   LDLCALC 156* 04/23/2016   ALT 18 04/23/2016   AST 16 04/23/2016   NA 140 04/23/2016   K 4.2 04/23/2016   CL 105 04/23/2016   CREATININE 0.95 04/23/2016   BUN 18 04/23/2016   CO2 28 04/23/2016   TSH 0.75 04/23/2016   PSA 7.23* 04/23/2016    Assessment & Plan:   Dan Dudley was seen today for annual exam, hyperlipidemia and hypertension.  Diagnoses and all orders for this visit:  PSA, INCREASED- his PSA has risen slightly though his exam is unremarkable, he will follow up with urology as indicated -     PSA, total and free; Future -     Urinalysis, Routine w reflex microscopic (not at [facility]); Future  BPH associated with nocturia- he complains of nocturia but he does not want to have this treated, he will let me know if he changes his mind about  treatment options. -     PSA, total and free; Future -     Urinalysis, Routine w reflex microscopic (not at [facility]); Future  Hyperlipidemia with target LDL less than 130- his Framingham risk score is 10%, this is in the moderate risk zone so will discuss with him whether or not he wants to start a statin to reduce his risk of heart attack or stroke -     Lipid panel; Future -     Comprehensive metabolic panel; Future -     CBC with Differential/Platelet; Future -     TSH; Future  Hearing loss, bilateral -     Ambulatory referral to Audiology  Essential hypertension- there may be some component of whitecoat phenomenon but his blood pressure elevation today is too high not to be treated so I have asked him to start taking an ARB. His EKG is negative for LVH and his labs show no evidence of end organ damage or secondary causes of hypertension. -     EKG 12-Lead -     telmisartan (MICARDIS) 40 MG tablet; Take 1 tablet (40 mg total) by mouth daily.   I have discontinued Mr. Handa's levofloxacin. I am also having him start on telmisartan. Additionally, I am having him maintain his calcium carbonate and  aspirin.  Meds ordered this encounter  Medications  . telmisartan (MICARDIS) 40 MG tablet    Sig: Take 1 tablet (40 mg total) by mouth daily.    Dispense:  90 tablet    Refill:  1   See AVS for instructions about healthy living and anticipatory guidance.  Follow-up: Return in about 2 months (around 06/23/2016).  Scarlette Calico, MD

## 2016-04-23 NOTE — Patient Instructions (Signed)

## 2016-04-23 NOTE — Progress Notes (Signed)
Pre visit review using our clinic review tool, if applicable. No additional management support is needed unless otherwise documented below in the visit note. 

## 2016-04-24 ENCOUNTER — Encounter: Payer: Self-pay | Admitting: Internal Medicine

## 2016-04-24 DIAGNOSIS — Z0000 Encounter for general adult medical examination without abnormal findings: Secondary | ICD-10-CM | POA: Insufficient documentation

## 2016-04-24 LAB — PSA, TOTAL AND FREE
PSA FREE PCT: 19 % — AB (ref >25)
PSA FREE: 1.34 ng/mL
PSA: 7.23 ng/mL — AB (ref <=4.00)

## 2016-04-24 NOTE — Assessment & Plan Note (Signed)

## 2016-05-18 ENCOUNTER — Encounter: Payer: Self-pay | Admitting: Internal Medicine

## 2016-06-21 ENCOUNTER — Ambulatory Visit (INDEPENDENT_AMBULATORY_CARE_PROVIDER_SITE_OTHER): Payer: Medicare HMO | Admitting: Internal Medicine

## 2016-06-21 ENCOUNTER — Encounter: Payer: Self-pay | Admitting: Internal Medicine

## 2016-06-21 VITALS — BP 126/82 | HR 62 | Temp 98.2°F | Resp 16 | Wt 187.0 lb

## 2016-06-21 DIAGNOSIS — R972 Elevated prostate specific antigen [PSA]: Secondary | ICD-10-CM | POA: Diagnosis not present

## 2016-06-21 DIAGNOSIS — I10 Essential (primary) hypertension: Secondary | ICD-10-CM | POA: Diagnosis not present

## 2016-06-21 NOTE — Progress Notes (Signed)
Subjective:  Patient ID: Dan Dudley, male    DOB: 10-Jan-1946  Age: 70 y.o. MRN: [id number]  CC: Hypertension   HPI Dan Dudley presents for a blood pressure check. He tells me his blood pressure has been well controlled on the ARB. He feels well today and offers no complaints.  Outpatient Medications Prior to Visit  Medication Sig Dispense Refill  . aspirin 81 MG tablet Take 81 mg by mouth daily.    . calcium carbonate (TUMS - DOSED IN MG ELEMENTAL CALCIUM) 500 MG chewable tablet Chew 1 tablet by mouth daily as needed for heartburn.    . telmisartan (MICARDIS) 40 MG tablet Take 1 tablet (40 mg total) by mouth daily. 90 tablet 1   No facility-administered medications prior to visit.     ROS Review of Systems  Constitutional: Negative.  Negative for activity change, appetite change, fatigue and unexpected weight change.  HENT: Negative.  Negative for sinus pressure and trouble swallowing.   Eyes: Negative.  Negative for visual disturbance.  Respiratory: Negative.  Negative for cough, choking, chest tightness, shortness of breath and stridor.   Cardiovascular: Negative.  Negative for chest pain, palpitations and leg swelling.  Gastrointestinal: Negative.  Negative for abdominal pain, constipation, diarrhea, nausea and vomiting.  Endocrine: Negative.   Genitourinary: Negative.  Negative for decreased urine volume and dysuria.  Musculoskeletal: Negative.  Negative for back pain, myalgias and neck pain.  Skin: Negative.   Allergic/Immunologic: Negative.   Neurological: Negative.  Negative for dizziness, tremors, weakness, light-headedness, numbness and headaches.  Hematological: Negative for adenopathy. Does not bruise/bleed easily.  Psychiatric/Behavioral: Negative.     Objective:  BP 126/82   Pulse 62   Temp 98.2 F (36.8 C) (Oral)   Resp 16   Wt 187 lb (84.8 kg)   SpO2 95%   BMI 25.36 kg/m   BP Readings from Last 3 Encounters:  06/21/16 126/82    04/23/16 (!) 140/100  08/13/14 122/78    Wt Readings from Last 3 Encounters:  06/21/16 187 lb (84.8 kg)  04/23/16 185 lb (83.9 kg)  08/13/14 188 lb (85.3 kg)    Physical Exam  Constitutional: He is oriented to person, place, and time. No distress.  HENT:  Mouth/Throat: Oropharynx is clear and moist. No oropharyngeal exudate.  Eyes: Conjunctivae are normal. No scleral icterus.  Neck: Normal range of motion. Neck supple. No JVD present. No tracheal deviation present. No thyromegaly present.  Cardiovascular: Normal rate, regular rhythm, normal heart sounds and intact distal pulses.  Exam reveals no gallop and no friction rub.   No murmur heard. Pulmonary/Chest: Effort normal and breath sounds normal. No stridor. No respiratory distress. He has no wheezes. He has no rales. He exhibits no tenderness.  Abdominal: Soft. Bowel sounds are normal. He exhibits no distension and no mass. There is no tenderness. There is no rebound and no guarding.  Musculoskeletal: Normal range of motion. He exhibits no edema, tenderness or deformity.  Lymphadenopathy:    He has no cervical adenopathy.  Neurological: He is oriented to person, place, and time.  Skin: Skin is warm and dry. No rash noted. He is not diaphoretic. No erythema. No pallor.  Vitals reviewed.   Lab Results  Component Value Date   WBC 6.8 04/23/2016   HGB 15.7 04/23/2016   HCT 46.7 04/23/2016   PLT 342.0 04/23/2016   GLUCOSE 100 (H) 04/23/2016   CHOL 238 (H) 04/23/2016   TRIG 68.0 04/23/2016   HDL 68.50  04/23/2016   LDLDIRECT 134.3 03/27/2010   LDLCALC 156 (H) 04/23/2016   ALT 18 04/23/2016   AST 16 04/23/2016   NA 140 04/23/2016   K 4.2 04/23/2016   CL 105 04/23/2016   CREATININE 0.95 04/23/2016   BUN 18 04/23/2016   CO2 28 04/23/2016   TSH 0.75 04/23/2016   PSA 7.23 (H) 04/23/2016    Ct Head Wo Contrast  Result Date: 11/01/2010 Clinical Data: Concussion with no loss of consciousness/cephalgia  CT HEAD WITHOUT  CONTRAST  Technique:  Contiguous axial images were obtained from the base of the skull through the vertex without contrast.  Comparison: 09/24/2003 by report only.  Findings: There is no evidence of acute intracranial hemorrhage, brain edema, mass lesion, acute infarction,   mass effect, or midline shift. Acute infarct may be inapparent on noncontrast CT. No other intra-axial abnormalities are seen, and the ventricles and sulci are within normal limits in size and symmetry.   No abnormal extra-axial fluid collections or masses are identified.  No significant calvarial abnormality.  IMPRESSION: 1. Negative for bleed or other acute intracranial process. Provider: [place]:   Dan Dudley was seen today for hypertension.  Diagnoses and all orders for this visit:  PSA, INCREASED- this has been followed by his urologist  Essential hypertension- his blood pressures well controlled, I will monitor his electrolytes and renal function today. -     Basic metabolic panel; Future   I am having [name] maintain his calcium carbonate, aspirin, and telmisartan.  No orders of the defined types were placed in this encounter.    Follow-up: Return in about 6 months (around 12/22/2016).  Scarlette Calico, MD

## 2016-06-21 NOTE — Patient Instructions (Signed)
Hypertension Hypertension, commonly called high blood pressure, is when the force of blood pumping through your arteries is too strong. Your arteries are the blood vessels that carry blood from your heart throughout your body. A blood pressure reading consists of a higher number over a lower number, such as 110/72. The higher number (systolic) is the pressure inside your arteries when your heart pumps. The lower number (diastolic) is the pressure inside your arteries when your heart relaxes. Ideally you want your blood pressure below 120/80. Hypertension forces your heart to work harder to pump blood. Your arteries may become narrow or stiff. Having untreated or uncontrolled hypertension can cause heart attack, stroke, kidney disease, and other problems. RISK FACTORS Some risk factors for high blood pressure are controllable. Others are not.  Risk factors you cannot control include:   Race. You may be at higher risk if you are African American.  Age. Risk increases with age.  Gender. Men are at higher risk than women before age 45 years. After age 65, women are at higher risk than men. Risk factors you can control include:  Not getting enough exercise or physical activity.  Being overweight.  Getting too much fat, sugar, calories, or salt in your diet.  Drinking too much alcohol. SIGNS AND SYMPTOMS Hypertension does not usually cause signs or symptoms. Extremely high blood pressure (hypertensive crisis) may cause headache, anxiety, shortness of breath, and nosebleed. DIAGNOSIS To check if you have hypertension, your health care provider will measure your blood pressure while you are seated, with your arm held at the level of your heart. It should be measured at least twice using the same arm. Certain conditions can cause a difference in blood pressure between your right and left arms. A blood pressure reading that is higher than normal on one occasion does not mean that you need treatment. If  it is not clear whether you have high blood pressure, you may be asked to return on a different day to have your blood pressure checked again. Or, you may be asked to monitor your blood pressure at home for 1 or more weeks. TREATMENT Treating high blood pressure includes making lifestyle changes and possibly taking medicine. Living a healthy lifestyle can help lower high blood pressure. You may need to change some of your habits. Lifestyle changes may include:  Following the DASH diet. This diet is high in fruits, vegetables, and whole grains. It is low in salt, red meat, and added sugars.  Keep your sodium intake below 2,300 mg per day.  Getting at least 30-45 minutes of aerobic exercise at least 4 times per week.  Losing weight if necessary.  Not smoking.  Limiting alcoholic beverages.  Learning ways to reduce stress. Your health care provider may prescribe medicine if lifestyle changes are not enough to get your blood pressure under control, and if one of the following is true:  You are 18-59 years of age and your systolic blood pressure is above 140.  You are 60 years of age or older, and your systolic blood pressure is above 150.  Your diastolic blood pressure is above 90.  You have diabetes, and your systolic blood pressure is over 140 or your diastolic blood pressure is over 90.  You have kidney disease and your blood pressure is above 140/90.  You have heart disease and your blood pressure is above 140/90. Your personal target blood pressure may vary depending on your medical conditions, your age, and other factors. HOME CARE INSTRUCTIONS    Have your blood pressure rechecked as directed by your health care provider.   Take medicines only as directed by your health care provider. Follow the directions carefully. Blood pressure medicines must be taken as prescribed. The medicine does not work as well when you skip doses. Skipping doses also puts you at risk for  problems.  Do not smoke.   Monitor your blood pressure at home as directed by your health care provider. SEEK MEDICAL CARE IF:   You think you are having a reaction to medicines taken.  You have recurrent headaches or feel dizzy.  You have swelling in your ankles.  You have trouble with your vision. SEEK IMMEDIATE MEDICAL CARE IF:  You develop a severe headache or confusion.  You have unusual weakness, numbness, or feel faint.  You have severe chest or abdominal pain.  You vomit repeatedly.  You have trouble breathing. MAKE SURE YOU:   Understand these instructions.  Will watch your condition.  Will get help right away if you are not doing well or get worse.   This information is not intended to replace advice given to you by your health care provider. Make sure you discuss any questions you have with your health care provider.   Document Released: 11/05/2005 Document Revised: 03/22/2015 Document Reviewed: 08/28/2013 Elsevier Interactive Patient Education 2016 [employer]  

## 2016-08-07 ENCOUNTER — Ambulatory Visit (INDEPENDENT_AMBULATORY_CARE_PROVIDER_SITE_OTHER): Payer: Medicare HMO

## 2016-08-07 DIAGNOSIS — Z23 Encounter for immunization: Secondary | ICD-10-CM | POA: Diagnosis not present

## 2016-11-26 DIAGNOSIS — H2513 Age-related nuclear cataract, bilateral: Secondary | ICD-10-CM | POA: Diagnosis not present

## 2016-11-26 DIAGNOSIS — H524 Presbyopia: Secondary | ICD-10-CM | POA: Diagnosis not present

## 2016-12-18 DIAGNOSIS — R69 Illness, unspecified: Secondary | ICD-10-CM | POA: Diagnosis not present

## 2017-05-02 ENCOUNTER — Other Ambulatory Visit (INDEPENDENT_AMBULATORY_CARE_PROVIDER_SITE_OTHER): Payer: Medicare HMO

## 2017-05-02 ENCOUNTER — Encounter: Payer: Self-pay | Admitting: Internal Medicine

## 2017-05-02 ENCOUNTER — Ambulatory Visit (INDEPENDENT_AMBULATORY_CARE_PROVIDER_SITE_OTHER): Payer: Medicare HMO | Admitting: Internal Medicine

## 2017-05-02 VITALS — BP 136/80 | HR 66 | Temp 98.0°F | Resp 12 | Ht 72.0 in | Wt 182.8 lb

## 2017-05-02 DIAGNOSIS — I10 Essential (primary) hypertension: Secondary | ICD-10-CM

## 2017-05-02 DIAGNOSIS — R351 Nocturia: Secondary | ICD-10-CM

## 2017-05-02 DIAGNOSIS — D2230 Melanocytic nevi of unspecified part of face: Secondary | ICD-10-CM | POA: Insufficient documentation

## 2017-05-02 DIAGNOSIS — E785 Hyperlipidemia, unspecified: Secondary | ICD-10-CM

## 2017-05-02 DIAGNOSIS — N401 Enlarged prostate with lower urinary tract symptoms: Secondary | ICD-10-CM

## 2017-05-02 DIAGNOSIS — R972 Elevated prostate specific antigen [PSA]: Secondary | ICD-10-CM | POA: Diagnosis not present

## 2017-05-02 DIAGNOSIS — K219 Gastro-esophageal reflux disease without esophagitis: Secondary | ICD-10-CM

## 2017-05-02 DIAGNOSIS — Z0000 Encounter for general adult medical examination without abnormal findings: Secondary | ICD-10-CM

## 2017-05-02 LAB — CBC WITH DIFFERENTIAL/PLATELET
BASOS PCT: 0.5 % (ref 0.0–3.0)
Basophils Absolute: 0 10*3/uL (ref 0.0–0.1)
EOS PCT: 1.5 % (ref 0.0–5.0)
Eosinophils Absolute: 0.1 10*3/uL (ref 0.0–0.7)
HEMATOCRIT: 47 % (ref 39.0–52.0)
HEMOGLOBIN: 15.7 g/dL (ref 13.0–17.0)
Lymphocytes Relative: 28.7 % (ref 12.0–46.0)
Lymphs Abs: 1.7 10*3/uL (ref 0.7–4.0)
MCHC: 33.3 g/dL (ref 30.0–36.0)
MCV: 92 fl (ref 78.0–100.0)
MONO ABS: 0.5 10*3/uL (ref 0.1–1.0)
MONOS PCT: 8.5 % (ref 3.0–12.0)
Neutro Abs: 3.7 10*3/uL (ref 1.4–7.7)
Neutrophils Relative %: 60.8 % (ref 43.0–77.0)
Platelets: 327 10*3/uL (ref 150.0–400.0)
RBC: 5.11 Mil/uL (ref 4.22–5.81)
RDW: 14.3 % (ref 11.5–15.5)
WBC: 6.1 10*3/uL (ref 4.0–10.5)

## 2017-05-02 LAB — URINALYSIS, ROUTINE W REFLEX MICROSCOPIC
Bilirubin Urine: NEGATIVE
HGB URINE DIPSTICK: NEGATIVE
Ketones, ur: NEGATIVE
Leukocytes, UA: NEGATIVE
NITRITE: NEGATIVE
RBC / HPF: NONE SEEN (ref 0–?)
Total Protein, Urine: NEGATIVE
Urine Glucose: NEGATIVE
Urobilinogen, UA: 0.2 (ref 0.0–1.0)
WBC UA: NONE SEEN (ref 0–?)
pH: 6 (ref 5.0–8.0)

## 2017-05-02 LAB — COMPREHENSIVE METABOLIC PANEL
ALBUMIN: 4.7 g/dL (ref 3.5–5.2)
ALT: 13 U/L (ref 0–53)
AST: 14 U/L (ref 0–37)
Alkaline Phosphatase: 49 U/L (ref 39–117)
BUN: 21 mg/dL (ref 6–23)
CHLORIDE: 103 meq/L (ref 96–112)
CO2: 29 mEq/L (ref 19–32)
Calcium: 9.9 mg/dL (ref 8.4–10.5)
Creatinine, Ser: 0.97 mg/dL (ref 0.40–1.50)
GFR: 80.74 mL/min (ref >60.00)
GLUCOSE: 100 mg/dL — AB (ref 70–99)
POTASSIUM: 4.1 meq/L (ref 3.5–5.1)
SODIUM: 140 meq/L (ref 135–145)
Total Bilirubin: 1.2 mg/dL (ref 0.2–1.2)
Total Protein: 6.8 g/dL (ref 6.0–8.3)

## 2017-05-02 LAB — LIPID PANEL
CHOL/HDL RATIO: 3
Cholesterol: 210 mg/dL — ABNORMAL HIGH (ref 0–200)
HDL: 63.4 mg/dL (ref >39.00)
LDL CALC: 135 mg/dL — AB (ref 0–99)
NonHDL: 146.88
Triglycerides: 61 mg/dL (ref 0.0–149.0)
VLDL: 12.2 mg/dL (ref 0.0–40.0)

## 2017-05-02 LAB — PSA: PSA: 9.03 ng/mL — AB (ref 0.10–4.00)

## 2017-05-02 LAB — TSH: TSH: 1.05 u[IU]/mL (ref 0.35–4.50)

## 2017-05-02 NOTE — Progress Notes (Signed)
Subjective:  Patient ID: Dan Dudley, male    DOB: [birth year]  Age: 71 y.o. MRN: [id number]  CC: Hypertension; Hyperlipidemia; and Annual Exam   HPI Dan Dudley presents for a CPX.  He tells me that his blood pressure has been well controlled on the ARB. He is very active and has had no recent episodes of CP, DOE, palpitations, edema, or fatigue.  He complains that there is a mole on the left side of his face it is getting larger and he wants to see a dermatologist.  Past Medical History:  Diagnosis Date  . Headache(784.0)   . Hyperlipidemia    Past Surgical History:  Procedure Laterality Date  . FLEXIBLE SIGMOIDOSCOPY    . HERNIA REPAIR     x3    reports that he quit smoking about 48 years ago. His smoking use included Cigarettes. He has a 2.00 pack-year smoking history. He does not have any smokeless tobacco history on file. He reports that he does not drink alcohol or use drugs. family history includes Heart disease in his father. No Known Allergies  Outpatient Medications Prior to Visit  Medication Sig Dispense Refill  . aspirin 81 MG tablet Take 81 mg by mouth daily.    . calcium carbonate (TUMS - DOSED IN MG ELEMENTAL CALCIUM) 500 MG chewable tablet Chew 1 tablet by mouth daily as needed for heartburn.    . telmisartan (MICARDIS) 40 MG tablet Take 1 tablet (40 mg total) by mouth daily. 90 tablet 1   No facility-administered medications prior to visit.     ROS Review of Systems  Constitutional: Negative.  Negative for activity change, appetite change, diaphoresis, fatigue and unexpected weight change.  HENT: Negative.  Negative for trouble swallowing.   Eyes: Negative.  Negative for visual disturbance.  Respiratory: Negative.  Negative for apnea, cough, chest tightness, shortness of breath, wheezing and stridor.   Cardiovascular: Negative for chest pain, palpitations and leg swelling.  Gastrointestinal: Negative.  Negative for abdominal pain,  constipation, diarrhea, nausea and vomiting.  Genitourinary: Negative.  Negative for difficulty urinating, dysuria, flank pain, frequency, penile pain, testicular pain and urgency.  Musculoskeletal: Negative.  Negative for arthralgias, joint swelling and myalgias.  Skin: Negative.   Allergic/Immunologic: Negative.   Neurological: Negative.   Hematological: Negative for adenopathy. Does not bruise/bleed easily.  Psychiatric/Behavioral: Negative.     Objective:  BP 136/80 (BP Location: Left Arm, Patient Position: Sitting, Cuff Size: Normal)   Pulse 66   Temp 98 F (36.7 C) (Oral)   Resp 12   Ht 6' (1.829 m)   Wt 182 lb 12 oz (82.9 kg)   SpO2 98%   BMI 24.79 kg/m   BP Readings from Last 3 Encounters:  05/02/17 136/80  06/21/16 126/82  04/23/16 (!) 140/100    Wt Readings from Last 3 Encounters:  05/02/17 182 lb 12 oz (82.9 kg)  06/21/16 187 lb (84.8 kg)  04/23/16 185 lb (83.9 kg)    Physical Exam  Constitutional: He is oriented to person, place, and time. No distress.  HENT:  Mouth/Throat: Oropharynx is clear and moist. No oropharyngeal exudate.  Eyes: Conjunctivae are normal. Right eye exhibits no discharge. Left eye exhibits no discharge. No scleral icterus.  Neck: Normal range of motion. Neck supple. No JVD present. No thyromegaly present.  Cardiovascular: Normal rate, regular rhythm and intact distal pulses.  Exam reveals no gallop.   No murmur heard. Pulmonary/Chest: Effort normal. No tachypnea. No respiratory distress. He has  no decreased breath sounds. He has no wheezes. He has no rhonchi. He has rales in the right lower field and the left lower field. He exhibits no tenderness.  Abdominal: Soft. Bowel sounds are normal. He exhibits no distension and no mass. There is no tenderness. There is no rebound and no guarding. Hernia confirmed negative in the right inguinal area and confirmed negative in the left inguinal area.  Genitourinary: Testes normal and penis normal.  Rectal exam shows external hemorrhoid. Rectal exam shows no internal hemorrhoid, no fissure, no mass, no tenderness, anal tone normal and guaiac negative stool. Prostate is enlarged (2+ smooth symm BPH). Prostate is not tender. Right testis shows no mass, no swelling and no tenderness. Right testis is descended. Left testis shows no mass, no swelling and no tenderness. Left testis is descended. Circumcised. No penile erythema or penile tenderness. No discharge found.  Musculoskeletal: Normal range of motion. He exhibits no edema or tenderness.  Lymphadenopathy:    He has no cervical adenopathy.       Right: No inguinal adenopathy present.       Left: No inguinal adenopathy present.  Neurological: He is alert and oriented to person, place, and time.  Skin: Skin is warm and dry. No rash noted. He is not diaphoretic. No erythema. No pallor.  There is a seborrheic keratosis around the left temple  Vitals reviewed.   Lab Results  Component Value Date   WBC 6.1 05/02/2017   HGB 15.7 05/02/2017   HCT 47.0 05/02/2017   PLT 327.0 05/02/2017   GLUCOSE 100 (H) 05/02/2017   CHOL 210 (H) 05/02/2017   TRIG 61.0 05/02/2017   HDL 63.40 05/02/2017   LDLDIRECT 134.3 03/27/2010   LDLCALC 135 (H) 05/02/2017   ALT 13 05/02/2017   AST 14 05/02/2017   NA 140 05/02/2017   K 4.1 05/02/2017   CL 103 05/02/2017   CREATININE 0.97 05/02/2017   BUN 21 05/02/2017   CO2 29 05/02/2017   TSH 1.05 05/02/2017   PSA 9.03 (H) 05/02/2017    Ct Head Wo Contrast  Result Date: 11/01/2010 Clinical Data: Concussion with no loss of consciousness/cephalgia  CT HEAD WITHOUT CONTRAST  Technique:  Contiguous axial images were obtained from the base of the skull through the vertex without contrast.  Comparison: 09/24/2003 by report only.  Findings: There is no evidence of acute intracranial hemorrhage, brain edema, mass lesion, acute infarction,   mass effect, or midline shift. Acute infarct may be inapparent on noncontrast  CT. No other intra-axial abnormalities are seen, and the ventricles and sulci are within normal limits in size and symmetry.   No abnormal extra-axial fluid collections or masses are identified.  No significant calvarial abnormality.  IMPRESSION: 1. Negative for bleed or other acute intracranial process. Provider: Elizabeth Sauer   Assessment & Plan:   Markham was seen today for hypertension, hyperlipidemia and annual exam.  Diagnoses and all orders for this visit:  Essential hypertension- his blood pressure is well-controlled, electrolytes and renal function are normal. -     Comprehensive metabolic panel; Future  Gastroesophageal reflux disease without esophagitis- his symptoms are well controlled with no medication -     CBC with Differential/Platelet; Future  BPH associated with nocturia- he has no symptoms that need to be treated -     PSA; Future -     Urinalysis, Routine w reflex microscopic; Future  PSA, INCREASED- his PSA has suddenly gone up to 9 so I have asked him to  see urology to see if he needs to screw be screened for prostate cancer -     PSA; Future -     Urinalysis, Routine w reflex microscopic; Future -     Ambulatory referral to Urology  Hyperlipidemia with target LDL less than 130- his Framingham risk score is elevated at 12% so I have asked him to start taking a statin and baby aspirin for cardiovascular risk reduction -     Lipid panel; Future -     TSH; Future -     atorvastatin (LIPITOR) 20 MG tablet; Take 1 tablet (20 mg total) by mouth daily.  Nevus of face -     Ambulatory referral to Dermatology   I am having Dan Dudley start on atorvastatin. I am also having him maintain his calcium carbonate, aspirin, and telmisartan.  Meds ordered this encounter  Medications  . atorvastatin (LIPITOR) 20 MG tablet    Sig: Take 1 tablet (20 mg total) by mouth daily.    Dispense:  90 tablet    Refill:  3   See AVS for instructions about healthy living and  anticipatory guidance.  Follow-up: Return in about 6 months (around 11/01/2017).  Scarlette Calico, MD

## 2017-05-02 NOTE — Patient Instructions (Signed)

## 2017-05-03 MED ORDER — ATORVASTATIN CALCIUM 20 MG PO TABS: 20.0000 mg | ORAL_TABLET | Freq: Every day | ORAL | 3 refills | Status: DC

## 2017-05-05 ENCOUNTER — Encounter: Payer: Self-pay | Admitting: Internal Medicine

## 2017-05-05 MED ORDER — ASPIRIN 81 MG PO TABS: 81.0000 mg | ORAL_TABLET | Freq: Every day | ORAL | 11 refills | Status: DC

## 2017-05-05 NOTE — Assessment & Plan Note (Signed)

## 2017-05-08 ENCOUNTER — Encounter: Payer: Self-pay | Admitting: Internal Medicine

## 2017-06-03 ENCOUNTER — Other Ambulatory Visit: Payer: Self-pay | Admitting: Dermatology

## 2017-06-03 DIAGNOSIS — D485 Neoplasm of uncertain behavior of skin: Secondary | ICD-10-CM | POA: Diagnosis not present

## 2017-06-03 DIAGNOSIS — L570 Actinic keratosis: Secondary | ICD-10-CM | POA: Diagnosis not present

## 2017-06-03 DIAGNOSIS — D2339 Other benign neoplasm of skin of other parts of face: Secondary | ICD-10-CM | POA: Diagnosis not present

## 2017-06-19 DIAGNOSIS — R972 Elevated prostate specific antigen [PSA]: Secondary | ICD-10-CM | POA: Diagnosis not present

## 2017-06-19 DIAGNOSIS — N400 Enlarged prostate without lower urinary tract symptoms: Secondary | ICD-10-CM | POA: Diagnosis not present

## 2017-06-24 ENCOUNTER — Other Ambulatory Visit: Payer: Self-pay | Admitting: Urology

## 2017-06-26 ENCOUNTER — Other Ambulatory Visit: Payer: Self-pay | Admitting: Urology

## 2017-06-26 DIAGNOSIS — R972 Elevated prostate specific antigen [PSA]: Secondary | ICD-10-CM

## 2017-07-04 ENCOUNTER — Encounter (HOSPITAL_BASED_OUTPATIENT_CLINIC_OR_DEPARTMENT_OTHER): Payer: Self-pay | Admitting: *Deleted

## 2017-07-04 NOTE — Progress Notes (Signed)
NPO AFTER MN.  ARRIVE AT [id number].  NEEDS ISTAT 8 AND EKG.  WILL TAKE PRILOSEC AM DOS W/ SIPS OF WATER. PT VERBALIZED UNDERSTANDING WILL DO FLEET ENEMA AM DOS.

## 2017-07-15 ENCOUNTER — Ambulatory Visit (HOSPITAL_BASED_OUTPATIENT_CLINIC_OR_DEPARTMENT_OTHER): Payer: Medicare HMO | Admitting: Anesthesiology

## 2017-07-15 ENCOUNTER — Ambulatory Visit (HOSPITAL_BASED_OUTPATIENT_CLINIC_OR_DEPARTMENT_OTHER)
Admission: RE | Admit: 2017-07-15 | Discharge: 2017-07-15 | Disposition: A | Discharge status: Home / Self Care | Payer: Medicare HMO | Source: Ambulatory Visit | Attending: Urology | Admitting: Urology

## 2017-07-15 ENCOUNTER — Encounter (HOSPITAL_BASED_OUTPATIENT_CLINIC_OR_DEPARTMENT_OTHER): Payer: Self-pay | Admitting: *Deleted

## 2017-07-15 ENCOUNTER — Ambulatory Visit (HOSPITAL_COMMUNITY)
Admission: RE | Admit: 2017-07-15 | Discharge: 2017-07-15 | Disposition: A | Discharge status: Home / Self Care | Payer: Medicare HMO | Source: Ambulatory Visit | Attending: Urology | Admitting: Urology

## 2017-07-15 ENCOUNTER — Encounter (HOSPITAL_BASED_OUTPATIENT_CLINIC_OR_DEPARTMENT_OTHER)
Admission: RE | Disposition: A | Discharge status: Home / Self Care | Payer: Self-pay | Source: Ambulatory Visit | Attending: Urology

## 2017-07-15 DIAGNOSIS — R972 Elevated prostate specific antigen [PSA]: Secondary | ICD-10-CM | POA: Diagnosis not present

## 2017-07-15 DIAGNOSIS — N400 Enlarged prostate without lower urinary tract symptoms: Secondary | ICD-10-CM | POA: Diagnosis not present

## 2017-07-15 DIAGNOSIS — Z9889 Other specified postprocedural states: Secondary | ICD-10-CM | POA: Diagnosis not present

## 2017-07-15 DIAGNOSIS — M1990 Unspecified osteoarthritis, unspecified site: Secondary | ICD-10-CM | POA: Insufficient documentation

## 2017-07-15 DIAGNOSIS — R351 Nocturia: Secondary | ICD-10-CM | POA: Insufficient documentation

## 2017-07-15 DIAGNOSIS — Z8249 Family history of ischemic heart disease and other diseases of the circulatory system: Secondary | ICD-10-CM | POA: Diagnosis not present

## 2017-07-15 DIAGNOSIS — K219 Gastro-esophageal reflux disease without esophagitis: Secondary | ICD-10-CM | POA: Diagnosis not present

## 2017-07-15 DIAGNOSIS — C61 Malignant neoplasm of prostate: Secondary | ICD-10-CM | POA: Insufficient documentation

## 2017-07-15 DIAGNOSIS — N401 Enlarged prostate with lower urinary tract symptoms: Secondary | ICD-10-CM | POA: Diagnosis present

## 2017-07-15 DIAGNOSIS — N4232 Atypical small acinar proliferation of prostate: Secondary | ICD-10-CM | POA: Diagnosis not present

## 2017-07-15 DIAGNOSIS — Z87891 Personal history of nicotine dependence: Secondary | ICD-10-CM | POA: Insufficient documentation

## 2017-07-15 DIAGNOSIS — Z79899 Other long term (current) drug therapy: Secondary | ICD-10-CM | POA: Diagnosis not present

## 2017-07-15 DIAGNOSIS — E785 Hyperlipidemia, unspecified: Secondary | ICD-10-CM | POA: Diagnosis not present

## 2017-07-15 DIAGNOSIS — Z7982 Long term (current) use of aspirin: Secondary | ICD-10-CM | POA: Insufficient documentation

## 2017-07-15 HISTORY — DX: Gastro-esophageal reflux disease without esophagitis: K21.9

## 2017-07-15 HISTORY — PX: PROSTATE BIOPSY: SHX241

## 2017-07-15 HISTORY — DX: Essential (primary) hypertension: I10

## 2017-07-15 HISTORY — DX: Unspecified hemorrhoids: K64.9

## 2017-07-15 LAB — POCT I-STAT, CHEM 8
BUN: 16 mg/dL (ref 6–20)
CALCIUM ION: 1.27 mmol/L (ref 1.15–1.40)
CHLORIDE: 104 mmol/L (ref 101–111)
Creatinine, Ser: 0.8 mg/dL (ref 0.61–1.24)
GLUCOSE: 99 mg/dL (ref 65–99)
HCT: 48 % (ref 39.0–52.0)
Hemoglobin: 16.3 g/dL (ref 13.0–17.0)
POTASSIUM: 3.8 mmol/L (ref 3.5–5.1)
Sodium: 142 mmol/L (ref 135–145)
TCO2: 24 mmol/L (ref 22–32)

## 2017-07-15 SURGERY — BIOPSY, PROSTATE, RECTAL APPROACH, WITH US GUIDANCE
Anesthesia: General | Site: Rectum

## 2017-07-15 MED ORDER — PROPOFOL 10 MG/ML IV BOLUS
INTRAVENOUS | Status: DC | PRN
Start: 2017-07-15 — End: 2017-07-15
  Administered 2017-07-15: 200 mg via INTRAVENOUS

## 2017-07-15 MED ORDER — CIPROFLOXACIN IN D5W 400 MG/200ML IV SOLN
400.0000 mg | Freq: Once | INTRAVENOUS | Status: DC
  Filled 2017-07-15: qty 200

## 2017-07-15 MED ORDER — DEXTROSE 5 % IV SOLN
5.0000 mg/kg | INTRAVENOUS | Status: AC
  Administered 2017-07-15: 420 mg via INTRAVENOUS
  Filled 2017-07-15: qty 10.5

## 2017-07-15 MED ORDER — DEXAMETHASONE SODIUM PHOSPHATE 10 MG/ML IJ SOLN
INTRAMUSCULAR | Status: AC
  Filled 2017-07-15: qty 1

## 2017-07-15 MED ORDER — PHENYLEPHRINE 40 MCG/ML (10ML) SYRINGE FOR IV PUSH (FOR BLOOD PRESSURE SUPPORT)
PREFILLED_SYRINGE | INTRAVENOUS | Status: DC | PRN
  Administered 2017-07-15: 160 ug via INTRAVENOUS

## 2017-07-15 MED ORDER — LIDOCAINE 2% (20 MG/ML) 5 ML SYRINGE
INTRAMUSCULAR | Status: DC | PRN
  Administered 2017-07-15: 60 mg via INTRAVENOUS

## 2017-07-15 MED ORDER — PROPOFOL 10 MG/ML IV BOLUS
INTRAVENOUS | Status: AC
  Filled 2017-07-15: qty 40

## 2017-07-15 MED ORDER — ONDANSETRON HCL 4 MG/2ML IJ SOLN
INTRAMUSCULAR | Status: DC | PRN
  Administered 2017-07-15: 4 mg via INTRAVENOUS

## 2017-07-15 MED ORDER — MIDAZOLAM HCL 5 MG/5ML IJ SOLN
INTRAMUSCULAR | Status: DC | PRN
  Administered 2017-07-15: 1 mg via INTRAVENOUS

## 2017-07-15 MED ORDER — PHENYLEPHRINE 40 MCG/ML (10ML) SYRINGE FOR IV PUSH (FOR BLOOD PRESSURE SUPPORT)
PREFILLED_SYRINGE | INTRAVENOUS | Status: AC
  Filled 2017-07-15: qty 10

## 2017-07-15 MED ORDER — FENTANYL CITRATE (PF) 100 MCG/2ML IJ SOLN
INTRAMUSCULAR | Status: DC | PRN
  Administered 2017-07-15: 50 ug via INTRAVENOUS

## 2017-07-15 MED ORDER — FENTANYL CITRATE (PF) 100 MCG/2ML IJ SOLN
25.0000 ug | INTRAMUSCULAR | Status: DC | PRN
  Filled 2017-07-15: qty 1

## 2017-07-15 MED ORDER — DEXAMETHASONE SODIUM PHOSPHATE 4 MG/ML IJ SOLN
INTRAMUSCULAR | Status: DC | PRN
Start: 2017-07-15 — End: 2017-07-15
  Administered 2017-07-15: 10 mg via INTRAVENOUS

## 2017-07-15 MED ORDER — FENTANYL CITRATE (PF) 100 MCG/2ML IJ SOLN
INTRAMUSCULAR | Status: AC
  Filled 2017-07-15: qty 2

## 2017-07-15 MED ORDER — LACTATED RINGERS IV SOLN
INTRAVENOUS | Status: DC
  Administered 2017-07-15 (×2): via INTRAVENOUS
  Filled 2017-07-15: qty 1000

## 2017-07-15 MED ORDER — MIDAZOLAM HCL 2 MG/2ML IJ SOLN
INTRAMUSCULAR | Status: AC
  Filled 2017-07-15: qty 2

## 2017-07-15 MED ORDER — ONDANSETRON HCL 4 MG/2ML IJ SOLN
INTRAMUSCULAR | Status: AC
Start: 2017-07-15 — End: ?
  Filled 2017-07-15: qty 2

## 2017-07-15 MED ORDER — GENTAMICIN SULFATE 40 MG/ML IJ SOLN
5.0000 mg/kg | INTRAVENOUS | Status: DC
  Filled 2017-07-15: qty 10.25

## 2017-07-15 MED ORDER — FLEET ENEMA 7-19 GM/118ML RE ENEM
1.0000 | ENEMA | Freq: Once | RECTAL | Status: AC
  Administered 2017-07-15: 1 via RECTAL
  Filled 2017-07-15: qty 1

## 2017-07-15 MED ORDER — LIDOCAINE 2% (20 MG/ML) 5 ML SYRINGE
INTRAMUSCULAR | Status: AC
  Filled 2017-07-15: qty 5

## 2017-07-15 MED ORDER — LIDOCAINE HCL 2 % EX GEL
CUTANEOUS | Status: DC | PRN
  Administered 2017-07-15: 1

## 2017-07-15 SURGICAL SUPPLY — 10 items
GLOVE BIOGEL M STRL SZ7.5 (GLOVE) ×2 IMPLANT
INST BIOPSY MAXCORE 18GX25 (NEEDLE) ×1 IMPLANT
INSTR BIOPSY MAXCORE 18GX20 (NEEDLE) IMPLANT
KIT RM TURNOVER CYSTO AR (KITS) ×2 IMPLANT
NDL SAFETY ECLIPSE 18X1.5 (NEEDLE) IMPLANT
NDL SPNL 22GX7 QUINCKE BK (NEEDLE) ×1 IMPLANT
NEEDLE HYPO 18GX1.5 SHARP (NEEDLE)
NEEDLE SPNL 22GX7 QUINCKE BK (NEEDLE) ×2 IMPLANT
SYR CONTROL 10ML LL (SYRINGE) ×2 IMPLANT
UNDERPAD 30X30 INCONTINENT (UNDERPADS AND DIAPERS) ×3 IMPLANT

## 2017-07-15 NOTE — Transfer of Care (Signed)
  Last Vitals:  Vitals:   07/15/17 1045 07/15/17 1240  BP: (!) 139/95   Pulse: 86   Resp: 16   Temp: 36.6 C 36.9 C  SpO2: 95%     Last Pain:  Vitals:   07/15/17 1045  TempSrc: Oral      Patients Stated Pain Goal: 7 (07/15/17 1109)  Immediate Anesthesia Transfer of Care Note  Patient: Dan Dudley  Procedure(s) Performed: Procedure(s) (LRB): BIOPSY TRANSRECTAL ULTRASONIC PROSTATE (TUBP) (N/A)  Patient Location: PACU  Anesthesia Type: General  Level of Consciousness: awake, alert  and oriented  Airway & Oxygen Therapy: Patient Spontanous Breathing and Patient connected to nasal cannula oxygen  Post-op Assessment: Report given to PACU RN and Post -op Vital signs reviewed and stable  Post vital signs: Reviewed and stable  Complications: No apparent anesthesia complications

## 2017-07-15 NOTE — Discharge Instructions (Addendum)
°  Post Anesthesia Home Care Instructions  Activity: Get plenty of rest for the remainder of the day. A responsible individual must stay with you for 24 hours following the procedure.  For the next 24 hours, DO NOT: -Drive a car -Paediatric nurse -Drink alcoholic beverages -Take any medication unless instructed by your physician -Make any legal decisions or sign important papers.  Meals: Start with liquid foods such as gelatin or soup. Progress to regular foods as tolerated. Avoid greasy, spicy, heavy foods. If nausea and/or vomiting occur, drink only clear liquids until the nausea and/or vomiting subsides. Call your physician if vomiting continues.  Special Instructions/Symptoms: Your throat may feel dry or sore from the anesthesia or the breathing tube placed in your throat during surgery. If this causes discomfort, gargle with warm salt water. The discomfort should disappear within 24 hours.  If you had a scopolamine patch placed behind your ear for the management of post- operative nausea and/or vomiting:  1. The medication in the patch is effective for 72 hours, after which it should be removed.  Wrap patch in a tissue and discard in the trash. Wash hands thoroughly with soap and water. 2. You may remove the patch earlier than 72 hours if you experience unpleasant side effects which may include dry mouth, dizziness or visual disturbances. 3. Avoid touching the patch. Wash your hands with soap and water after contact with the patch.   Call your surgeon if you experience:   1.  Fever over 101.0. 2.  Inability to urinate. 3.  Nausea and/or vomiting. 4.  Extreme swelling or bruising at the surgical site. 5.. Any problems or concerns.

## 2017-07-15 NOTE — Anesthesia Procedure Notes (Signed)
Procedure Name: LMA Insertion Date/Time: 07/15/2017 12:12 PM Performed by: Suzette Battiest Pre-anesthesia Checklist: Patient identified, Emergency Drugs available, Suction available and Patient being monitored Patient Re-evaluated:Patient Re-evaluated prior to induction Oxygen Delivery Method: Circle system utilized Preoxygenation: Pre-oxygenation with 100% oxygen Induction Type: IV induction Ventilation: Mask ventilation without difficulty LMA: LMA inserted LMA Size: 5.0 Number of attempts: 1 Airway Equipment and Method: Bite block Placement Confirmation: positive ETCO2 Tube secured with: Tape Dental Injury: Teeth and Oropharynx as per pre-operative assessment

## 2017-07-15 NOTE — Anesthesia Preprocedure Evaluation (Signed)
Anesthesia Evaluation  Patient identified by MRN, date of birth, ID band Patient awake    Reviewed: Allergy & Precautions, NPO status , Patient's Chart, lab work & pertinent test results  Airway Mallampati: II  TM Distance: >3 FB Neck ROM: Full    Dental  (+) Dental Advisory Given   Pulmonary former smoker,    breath sounds clear to auscultation       Cardiovascular hypertension,  Rhythm:Regular Rate:Normal     Neuro/Psych negative neurological ROS     GI/Hepatic Neg liver ROS, GERD  ,  Endo/Other  negative endocrine ROS  Renal/GU negative Renal ROS     Musculoskeletal   Abdominal   Peds  Hematology negative hematology ROS (+)   Anesthesia Other Findings   Reproductive/Obstetrics                             Anesthesia Physical Anesthesia Plan  ASA: II  Anesthesia Plan: General   Post-op Pain Management:    Induction: Intravenous  PONV Risk Score and Plan:   Airway Management Planned: LMA  Additional Equipment:   Intra-op Plan:   Post-operative Plan: Extubation in OR  Informed Consent: I have reviewed the patients History and Physical, chart, labs and discussed the procedure including the risks, benefits and alternatives for the proposed anesthesia with the patient or authorized representative who has indicated his/her understanding and acceptance.   Dental advisory given  Plan Discussed with: CRNA  Anesthesia Plan Comments:         Anesthesia Quick Evaluation

## 2017-07-15 NOTE — Op Note (Signed)
Preoperative Diagnosis: Elevated PSA  Postoperative Diagnosis: Same  Procedure: 1. Transrectal ultrasound of prostate 2. Prostate biopsy x12  Surgeon: Baruch Gouty, M.D.  Informed consent was obtained after discussing risks/benefits of the procedure.  A time out was performed to ensure correct patient identity.  Pre-Procedure: - Last PSA Level:  Lab Results  Component Value Date   PSA 9.03 (H) 05/02/2017   PSA 7.23 (H) 04/23/2016   PSA 5.68 (H) 10/18/2014   - Gentamicin given prophylactically - Levaquin 500 mg administered PO -Transrectal Ultrasound performed revealing a 80.2 gm prostate -No significant hypoechoic or median lobe noted  Procedure: -Biopsies taken from sextant areas, a total of 12 under ultrasound guidance. Specimens sent to pathology.  Post-Procedure: - Patient tolerated the procedure well - He was counseled to seek immediate medical attention if experiences any severe pain, significant bleeding, or fevers - Return in one week to discuss biopsy results

## 2017-07-15 NOTE — H&P (Signed)
CC/HPI: Elevated PSA-Established Patient     His PSA has been fluctuating over the last 6 years. It was 4.69 in May 2011, 6.17 in November 2014, 4.50 in February, 5.95 in August and now 5.68 after a course of Cipro for presumed prostatitis. His PSA is now 6.17 in November 2016 PSA. PSA rose to 5.68 in November 2015. It was 7.23 in June 2017. It rose to 9.03 in June 2018. He does not have a family history of prostate cancer.   The patient does have a history of prostate biopsy. He did not tolerate it well. He states that it was one of the worst experiences of his life.    CC: BPH  HPI: Dan Dudley is a 71 year-old male established patient who is here for follow up regarding further evaluation of BPH and lower urinary tract symptoms.      AUA Symptom Score: He never has the sensation of not emptying his bladder completely after finishing urinating. He never has to urinate again less that two hours after he has finished urinating. He does not have to stop and start again several times when he urinates. He never finds it difficult to postpone urination. He never has a weak urinary stream. He never has to push or strain to begin urination. He has to get up to urinate 2 times from the time he goes to bed until the time he gets up in the morning.   Calculated AUA Symptom Score: 2    ALLERGIES: No Allergies    MEDICATIONS: Aspirin  Motrin Ib  Omeprazole 20 mg tablet, delayed release Oral     GU PSH: None     PSH Notes: Inguinal Hernia Repair, Inguinal Hernia Repair   NON-GU PSH: Hernia Repair - 2000, 1995, 1980    GU PMH: Atypia of the prostate, Atypical small acinar proliferation of prostate - 2016 Elevated PSA, Elevated prostate specific antigen (PSA) - 2016 BPH w/LUTS, Benign localized prostatic hyperplasia with lower urinary tract symptoms (LUTS) - 2015 Nocturia, Nocturia - 2015    NON-GU PMH: Encounter for general adult medical examination without abnormal findings,  Encounter for preventive health examination - 2016 Personal history of other diseases of the musculoskeletal system and connective tissue, History of arthritis - 2015 Personal history of other endocrine, nutritional and metabolic disease, History of hypercholesterolemia - 2015 GERD Hypercholesterolemia    FAMILY HISTORY: 1 Daughter - Daughter 1 son - Son cardiac disorder - Runs In Family   SOCIAL HISTORY: Marital Status: Married Preferred Language: English; Ethnicity: Not Hispanic Or Latino; Race: White Current Smoking Status: Patient does not smoke anymore. Has not smoked since 06/19/1972. Smoked for 6 years.   Tobacco Use Assessment Completed: Used Tobacco in last 30 days? Social Drinker.  Drinks 3 caffeinated drinks per day. Patient's occupation is/was Retired.     Notes: Former smoker, Father deceased, Alcohol use, Occupation, Mother deceased, Caffeine use, Married, Retired, History of smoking   REVIEW OF SYSTEMS:    GU Review Male:   Patient denies frequent urination, hard to postpone urination, burning/ pain with urination, get up at night to urinate, leakage of urine, stream starts and stops, trouble starting your stream, have to strain to urinate , erection problems, and penile pain.  Gastrointestinal (Upper):   Patient denies nausea, vomiting, and indigestion/ heartburn.  Gastrointestinal (Lower):   Patient denies diarrhea and constipation.  Constitutional:   Patient denies fever, night sweats, weight loss, and fatigue.  Skin:   Patient denies skin rash/ lesion  and itching.  Eyes:   Patient denies blurred vision and double vision.  Ears/ Nose/ Throat:   Patient denies sore throat and sinus problems.  Hematologic/Lymphatic:   Patient denies swollen glands and easy bruising.  Cardiovascular:   Patient denies leg swelling and chest pains.  Respiratory:   Patient denies cough and shortness of breath.  Endocrine:   Patient denies excessive thirst.  Musculoskeletal:   Patient  denies back pain and joint pain.  Neurological:   Patient denies headaches and dizziness.  Psychologic:   Patient denies depression and anxiety.   VITAL SIGNS:      06/19/2017 08:23 AM  Weight 184 lb / 83.46 kg  Height 72 in / 182.88 cm  BP 146/91 mmHg  Pulse 76 /min  Temperature 98.1 F / 36.7 C  BMI 25.0 kg/m   MULTI-SYSTEM PHYSICAL EXAMINATION:    Constitutional: Well-nourished. No physical deformities. Normally developed. Good grooming.  Neck: Neck symmetrical, not swollen. Normal tracheal position.  Respiratory: No labored breathing, no use of accessory muscles.   Cardiovascular: Normal temperature, normal extremity pulses, no swelling, no varicosities.  Skin: No paleness, no jaundice, no cyanosis. No lesion, no ulcer, no rash.  Gastrointestinal: No mass, no tenderness, no rigidity, non obese abdomen.  Eyes: Normal conjunctivae. Normal eyelids.  Ears, Nose, Mouth, and Throat: Left ear no scars, no lesions, no masses. Right ear no scars, no lesions, no masses. Nose no scars, no lesions, no masses. Normal hearing. Normal lips.  Musculoskeletal: Normal gait and station of head and neck.     PAST DATA REVIEWED:  Source Of History:  Patient   02/22/15  PSA  Total PSA 6.16   Free PSA 1.14   % Free PSA 19     PROCEDURES:          Urinalysis - 81003 Dipstick Dipstick Cont'd  Color: Yellow Bilirubin: Neg  Appearance: Clear Ketones: Neg  Specific Gravity: 1.015 Blood: Neg  pH: 6.0 Protein: Neg  Glucose: Neg Urobilinogen: 0.2    Nitrites: Neg    Leukocyte Esterase: Neg    Notes:      ASSESSMENT:      ICD-10 Details  1 GU:   Elevated PSA - R97.20   2   BPH w/o LUTS - N40.0    PLAN:           Document Letter(s):  Created for Patient: Clinical Summary    I explained the prostate biopsy procedure detail. The patient understands that we will perform a transrectal ultrasound first. This will be followed by numbing the prostate using local anesthesia, and then  proceeding with at least a 12 core biopsies. He understands the risk of prostate biopsy which include approximately 2% chance of developing a systemic infection requiring IV antibiotics. I discussed the small risk of urinary retention. He is also aware that he will likely have blood in his urine and stool for at least 48 hours up to 2 weeks. I also explained that he is likely to have blood in his ejaculate for up to 6 weeks.  Having been given all the information including the risks of prostate cancer as well as the risks of prostate biopsy the patient has agreed to proceed. We'll schedule his biopsy for him at his earliest convenience.        Notes:   I discussed the patient and his PSA has almost doubled in the last 3 years. I did recommend undergoing repeat prostate biopsy. Due to how he did not  tolerate the past, we will have him follow-up for transrectal ultrasound prostate biopsy in the operating room.

## 2017-07-16 ENCOUNTER — Encounter (HOSPITAL_BASED_OUTPATIENT_CLINIC_OR_DEPARTMENT_OTHER): Payer: Self-pay | Admitting: Urology

## 2017-07-16 NOTE — Anesthesia Postprocedure Evaluation (Signed)
Anesthesia Post Note  Patient: Dan Dudley  Procedure(s) Performed: Procedure(s) (LRB): BIOPSY TRANSRECTAL ULTRASONIC PROSTATE (TUBP) (N/A)     Patient location during evaluation: PACU Anesthesia Type: General Level of consciousness: awake and alert Pain management: pain level controlled Vital Signs Assessment: post-procedure vital signs reviewed and stable Respiratory status: spontaneous breathing, nonlabored ventilation, respiratory function stable and patient connected to nasal cannula oxygen Cardiovascular status: blood pressure returned to baseline and stable Postop Assessment: no signs of nausea or vomiting Anesthetic complications: no    Last Vitals:  Vitals:   07/15/17 1300 07/15/17 1440  BP: 120/82 123/78  Pulse: 69 83  Resp: 14 16  Temp:  36.8 C  SpO2: 95% 94%    Last Pain:  Vitals:   07/15/17 1045  TempSrc: Oral                 Tiajuana Amass

## 2017-07-17 ENCOUNTER — Other Ambulatory Visit: Payer: Self-pay | Admitting: Internal Medicine

## 2017-07-24 DIAGNOSIS — R69 Illness, unspecified: Secondary | ICD-10-CM | POA: Diagnosis not present

## 2017-07-29 DIAGNOSIS — C61 Malignant neoplasm of prostate: Secondary | ICD-10-CM | POA: Diagnosis not present

## 2017-08-06 ENCOUNTER — Encounter: Payer: Self-pay | Admitting: Radiation Oncology

## 2017-08-13 ENCOUNTER — Ambulatory Visit (INDEPENDENT_AMBULATORY_CARE_PROVIDER_SITE_OTHER): Payer: Medicare HMO | Admitting: General Practice

## 2017-08-13 DIAGNOSIS — Z23 Encounter for immunization: Secondary | ICD-10-CM

## 2017-08-16 ENCOUNTER — Encounter: Payer: Self-pay | Admitting: Radiation Oncology

## 2017-08-16 NOTE — Progress Notes (Signed)
GU Location of Tumor / Histology: prostatic adenocarcinoma  If Prostate Cancer, Gleason Score is (4 + 3) and PSA is (9.03). Prostate volume: 80.2 grams.   Dan Dudley's PSA has been fluctuating over the last six years. Patient's PCP retired. Dr. Scarlette Calico [facility]) began taking care of the patient and noted his PSA doubled. Dr. Ronnald Ramp referred the patient to Dr. Pilar Jarvis.  May 2011 4.69 Nov 2014 4.50 Feb 2015 5.95 Aug 2015 5.95 Nov 2016 6.05 May 2016 7.11 May 2017 9.03  Biopsies of prostate (if applicable) revealed:     Past/Anticipated interventions by urology, if any: prostate biopsy, referral for consideration of radiation therapy  Past/Anticipated interventions by medical oncology, if any: no  Weight changes, if any: no  Bowel/Bladder complaints, if any: IPSS 11. Reports ED, urinary frequency and nocturia x 3. Denies dysuria, hematuria, leakage or incontinence   Nausea/Vomiting, if any: no  Pain issues, if any:  no  SAFETY ISSUES:  Prior radiation? no  Pacemaker/ICD? no  Possible current pregnancy? no  Is the patient on methotrexate? no  Current Complaints / other details:  71 year old male. Married. Retired.

## 2017-08-19 ENCOUNTER — Encounter: Payer: Self-pay | Admitting: Radiation Oncology

## 2017-08-19 ENCOUNTER — Ambulatory Visit
Admission: RE | Admit: 2017-08-19 | Discharge: 2017-08-19 | Disposition: A | Discharge status: Home / Self Care | Payer: Medicare HMO | Source: Ambulatory Visit | Attending: Radiation Oncology | Admitting: Radiation Oncology

## 2017-08-19 ENCOUNTER — Encounter: Payer: Self-pay | Admitting: Medical Oncology

## 2017-08-19 VITALS — BP 143/92 | HR 72 | Temp 98.0°F | Resp 18 | Ht 72.0 in | Wt 185.0 lb

## 2017-08-19 DIAGNOSIS — K219 Gastro-esophageal reflux disease without esophagitis: Secondary | ICD-10-CM | POA: Insufficient documentation

## 2017-08-19 DIAGNOSIS — Z8249 Family history of ischemic heart disease and other diseases of the circulatory system: Secondary | ICD-10-CM | POA: Diagnosis not present

## 2017-08-19 DIAGNOSIS — I10 Essential (primary) hypertension: Secondary | ICD-10-CM | POA: Insufficient documentation

## 2017-08-19 DIAGNOSIS — Z888 Allergy status to other drugs, medicaments and biological substances status: Secondary | ICD-10-CM | POA: Insufficient documentation

## 2017-08-19 DIAGNOSIS — R351 Nocturia: Secondary | ICD-10-CM | POA: Diagnosis not present

## 2017-08-19 DIAGNOSIS — Z79899 Other long term (current) drug therapy: Secondary | ICD-10-CM | POA: Diagnosis not present

## 2017-08-19 DIAGNOSIS — C61 Malignant neoplasm of prostate: Secondary | ICD-10-CM | POA: Insufficient documentation

## 2017-08-19 DIAGNOSIS — Z510 Encounter for antineoplastic radiation therapy: Secondary | ICD-10-CM | POA: Diagnosis not present

## 2017-08-19 DIAGNOSIS — N529 Male erectile dysfunction, unspecified: Secondary | ICD-10-CM | POA: Diagnosis not present

## 2017-08-19 DIAGNOSIS — R972 Elevated prostate specific antigen [PSA]: Secondary | ICD-10-CM | POA: Diagnosis not present

## 2017-08-19 DIAGNOSIS — R350 Frequency of micturition: Secondary | ICD-10-CM | POA: Diagnosis not present

## 2017-08-19 DIAGNOSIS — E785 Hyperlipidemia, unspecified: Secondary | ICD-10-CM | POA: Insufficient documentation

## 2017-08-19 DIAGNOSIS — Z87891 Personal history of nicotine dependence: Secondary | ICD-10-CM | POA: Diagnosis not present

## 2017-08-19 HISTORY — DX: Malignant neoplasm of prostate: C61

## 2017-08-19 NOTE — Progress Notes (Signed)
Introduced myself to Dan Dudley and his wife as the prostate nurse navigator and my role. He is here today to discuss his radiation treatment options. He states he is interested in brachytherapy. I will continue to follow and asked them to call with questions or concerns

## 2017-08-19 NOTE — Progress Notes (Signed)
Radiation Oncology         [phone] ________________________________  Initial Outpatient Consultation  Name: Dan Dudley MRN: [id number]  Date: 08/19/2017  DOB: [birth year]  XY:BFXOV, Arvid Right, MD  Nickie Retort, MD   REFERRING PHYSICIAN: Nickie Retort, MD  DIAGNOSIS:  71 y.o. gentleman with Stage T1c adenocarcinoma of the prostate with Gleason Score of 4+3, and PSA of 9.06    ICD-10-CM   1. Malignant neoplasm of prostate ([place]) C61   2. Stage 1 adenocarcinoma of prostate ([place]) C61     HISTORY OF PRESENT ILLNESS: Dan Dudley is a 71 y.o. male with a diagnosis of prostate cancer. He was noted to have an elevated PSA of 9.03 by his primary care physician, Dr. Scarlette Calico.  Accordingly, he was referred for evaluation of his elevated PSA and BPH by Dr. Pilar Jarvis on 06/19/2017. He is an established patient of Dr. Pilar Jarvis for fluctuating PSA over the last 6 years. He had a negative prostate biopsy on 04/11/2015. He did not tolerate it well. He proceeded to digital rectal examination which revealed no nodularity.  Transrectal ultrasound with 12 biopsies of the prostate was done on 06/03/2017 but could not be completed due to pain. This was repeated on 07/15/2017.  The prostate volume measured 80.2 cc.  Out of 12 core biopsies, 3 were positive.  The maximum Gleason score was 4+3, and this was seen in the right apex medial. In addition, 3+3 was seen in the right mid lateral and right apex lateral.   The patient reviewed the biopsy results with his urologist and he has kindly been referred today for discussion of potential radiation treatment options. He is accompanied by his wife. He reports urinary frequency, nocturia x3, and ED.    PREVIOUS RADIATION THERAPY: No  PAST MEDICAL HISTORY:  Past Medical History:  Diagnosis Date  . BPH associated with nocturia   . Elevated PSA   . GERD (gastroesophageal reflux disease)   . Headache(784.0)   . Hemorrhoids    internal/ external  . Hyperlipidemia    per pt watching diet  . Hypertension per pt thinks its white coat because at home normal bp   per pt currently no medication as trial w/ pcp ok (was taking micardis 51m)  . [place]       PAST SURGICAL HISTORY: Past Surgical History:  Procedure Laterality Date  . COLONOSCOPY  last one 08-15-2010  . HEMANGIOMA EXCISION  09/25/2004   elliptical excision left lower lip  . INGUINAL HERNIA REPAIR Bilateral left 10-25-2000  dr mHassell Done right [facility] . PROSTATE BIOPSY N/A 07/15/2017   Procedure: BIOPSY TRANSRECTAL ULTRASONIC PROSTATE (TUBP);  Surgeon: BNickie Retort MD;  [facility]  Service: Urology;  Laterality: N/A;    FAMILY HISTORY:  Family History  Problem Relation Age of Onset  . COPD Unknown   . Heart disease Father        age 73 nonsmoker, heavy drinker  . Cancer Mother        unknown    SOCIAL HISTORY:  Social History   Social History  . Marital status: Married    Spouse name: N/A  . Number of children: N/A  . Years of education: N/A   Occupational History  . self employed Self-Employed   Social History Main Topics  . Smoking status: Former Smoker    Packs/day: 0.25    Years: 8.00    Types: Cigarettes  Quit date: 11/19/1968  . Smokeless tobacco: Never Used  . Alcohol use Yes     Comment: occasional  . Drug use: No  . Sexual activity: Yes   Other Topics Concern  . Not on file   Social History Narrative   Married Avery 2 kids. Randall Hiss lives in [place], [place] -bachelor and Radio producer; Katharine Look in [place] with a boy and girl (21 in 2015).    Missy-toy poodle (9).       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 omeprazole (PRILOSEC) 20 MG capsule Take 20 mg by mouth every morning.    Marland Kitchen aspirin EC 325  MG tablet Take 325 mg by mouth as needed.    . calcium carbonate (TUMS - DOSED IN MG ELEMENTAL CALCIUM) 500 MG chewable tablet Chew 1 tablet by mouth daily as needed for heartburn.    Marland Kitchen ibuprofen (ADVIL,MOTRIN) 200 MG tablet Take 200 mg by mouth every 6 (six) hours as needed.     No current facility-administered medications for this encounter.     REVIEW OF SYSTEMS:  On review of systems, the patient reports that he is doing well overall. He denies any chest pain, shortness of breath, cough, fevers, chills, night sweats, or unintended weight changes. He denies any bowel disturbances, and denies abdominal pain, nausea or vomiting. He denies any new musculoskeletal or joint aches or pains. His IPSS was 11, indicating moderate urinary symptoms, including frequency and nocturia x3. He denies dysuria, hematuria, leakage or incontinence. He is unable to complete sexual activity. A complete review of systems is obtained and is otherwise negative.    PHYSICAL EXAM:  Wt Readings from Last 3 Encounters:  08/19/17 185 lb (83.9 kg)  07/15/17 185 lb (83.9 kg)  05/02/17 182 lb 12 oz (82.9 kg)   Temp Readings from Last 3 Encounters:  08/19/17 98 F (36.7 C) (Oral)  07/15/17 98.3 F (36.8 C)  05/02/17 98 F (36.7 C) (Oral)   BP Readings from Last 3 Encounters:  08/19/17 (!) 143/92  07/15/17 123/78  05/02/17 136/80   Pulse Readings from Last 3 Encounters:  08/19/17 72  07/15/17 83  05/02/17 66   Pain Assessment Pain Score: 0-No pain/10  In general this is a well appearing Caucasian man in no acute distress. He is alert and oriented x4 and appropriate throughout the examination. HEENT reveals that the patient is normocephalic, atraumatic.Skin is intact without gross lesions. Cardiovascular exam reveals a regular rate and rhythm, no clicks rubs or murmurs are auscultated. Chest is clear to auscultation bilaterally. Lymphatic assessment is performed and does not reveal any adenopathy in the  cervical, supraclavicular, axillary, or inguinal chains. Abdomen has active bowel sounds in all quadrants and is intact. The abdomen is soft, non tender, non distended with active bowel sounds x4. No edema is noted of his lower extremities bilaterally.   KPS = 100  100 - Normal; no complaints; no evidence of disease. 90   - Able to carry on normal activity; minor signs or symptoms of disease. 80   - Normal activity with effort; some signs or symptoms of disease. 18   - Cares for self; unable to carry on normal activity or to do active work. 60   - Requires occasional assistance, but is able to care for most of his personal needs. 50   -  Requires considerable assistance and frequent medical care. 49   - Disabled; requires special care and assistance. 48   - Severely disabled; hospital admission is indicated although death not imminent. 30   - Very sick; hospital admission necessary; active supportive treatment necessary. 10   - Moribund; fatal processes progressing rapidly. 0     - Dead  Karnofsky DA, Abelmann [place], Craver LS and Burchenal [facility] [phone]) The use of the nitrogen mustards in the palliative treatment of carcinoma: with particular reference to bronchogenic carcinoma Cancer 1 634-56  LABORATORY DATA:  Lab Results  Component Value Date   WBC 6.1 05/02/2017   HGB 16.3 07/15/2017   HCT 48.0 07/15/2017   MCV 92.0 05/02/2017   PLT 327.0 05/02/2017   Lab Results  Component Value Date   NA 142 07/15/2017   K 3.8 07/15/2017   CL 104 07/15/2017   CO2 29 05/02/2017   Lab Results  Component Value Date   ALT 13 05/02/2017   AST 14 05/02/2017   ALKPHOS 49 05/02/2017   BILITOT 1.2 05/02/2017     RADIOGRAPHY: No results found.    IMPRESSION/PLAN: 1. 71 y.o. gentleman with unfavorable intermediate risk (UIR) Stage T1c adenocarcinoma of the prostate with Gleason Score of 4+3, and PSA of 9.06. We discussed the patient's workup and outlines the nature of prostate cancer in this  setting. The patient's T stage, Gleason's score, and PSA put him into the intermediate risk group. Accordingly, he is eligible for 8 weeks of external radiation  We discussed the available radiation techniques, and focused on the details and logistics and delivery. The patient may not be an ideal candidate for brachytherapy boost with a prostate volume of 80 cc prior to downsizing from hormone therapy. That being said, we do not recommend this due to concerns with the prostate size. We discussed that based on his prostate volume, he would require beginning treatment with a 5 alpha reductase inhibitor and ADT for at least 3 months to allow for downsizing of the prostate prior to initiating radiotherapy. We would need to repeat a TRUSP after 3 months of therapy to reassess the prostate volume at that time and confirm whether he is a candidate for radiotherapy. We discussed and outlined the risks, benefits, short and long-term effects associated with radiotherapy and compared and contrasted these with prostatectomy. We discussed the role of SpaceOAR in reducing the rectal toxicity associated with radiotherapy. At the end of the conversation the patient is interested in moving forward with 8 weeks of external radiotherapy. He has not had placement of gold fiducial markers. We will contact [facility] to make arrangements for fiducial marker and SpaceOAR gel placement prior to simulation. He has met with our scheduling team and we will coordinate this very soon.  We spent 60 minutes face to face with the patient and more than 50% of that time was spent in counseling and/or coordination of care.     Carola Rhine, PAC  and   Tyler Pita, MD [facility] Medical Director and Director of Stereotactic Radiosurgery Direct Dial: [phone]  Fax: [phone] [email or web address]  Skype  LinkedIn  This document serves as a record of services personally performed by Tyler Pita, MD and Shona Simpson, PA-C. It was created on their behalf by Rae Lips, a trained medical scribe. The creation of this record is based on the scribe's personal observations and the providers' statements to them. This document has been checked and approved by the  attending providers.

## 2017-08-19 NOTE — Progress Notes (Signed)
See progress note under physician encounter. 

## 2017-08-27 ENCOUNTER — Telehealth: Payer: Self-pay | Admitting: *Deleted

## 2017-08-27 NOTE — Telephone Encounter (Signed)
Called patient to give an update on his case, lvm for a return call.

## 2017-09-03 DIAGNOSIS — Z5111 Encounter for antineoplastic chemotherapy: Secondary | ICD-10-CM | POA: Diagnosis not present

## 2017-09-03 DIAGNOSIS — C61 Malignant neoplasm of prostate: Secondary | ICD-10-CM | POA: Diagnosis not present

## 2017-09-05 ENCOUNTER — Other Ambulatory Visit: Payer: Self-pay | Admitting: Urology

## 2017-09-05 DIAGNOSIS — C61 Malignant neoplasm of prostate: Secondary | ICD-10-CM

## 2017-09-06 ENCOUNTER — Telehealth: Payer: Self-pay | Admitting: *Deleted

## 2017-09-06 NOTE — Telephone Encounter (Signed)
Called patient to inform of Fid. Marker and space oar case for 10-16-17 @ 10 am @ [place] and his sim on 10-18-17 @ 11 am, spoke with patient and he is aware of these appts.

## 2017-09-09 ENCOUNTER — Other Ambulatory Visit: Payer: Self-pay | Admitting: Urology

## 2017-10-01 DIAGNOSIS — C61 Malignant neoplasm of prostate: Secondary | ICD-10-CM | POA: Diagnosis not present

## 2017-10-01 DIAGNOSIS — R972 Elevated prostate specific antigen [PSA]: Secondary | ICD-10-CM | POA: Diagnosis not present

## 2017-10-07 ENCOUNTER — Encounter (HOSPITAL_BASED_OUTPATIENT_CLINIC_OR_DEPARTMENT_OTHER): Payer: Self-pay | Admitting: *Deleted

## 2017-10-07 ENCOUNTER — Other Ambulatory Visit: Payer: Self-pay

## 2017-10-07 NOTE — Progress Notes (Signed)
NPO AFTER MN.  ARRIVE AT 0800.  GETTING LAB WORK (CBC, CMET, PT/INR, PTT) DONE Tuesday 10-08-2017 AND CXR.  CURRENT EKG IN CHART AND Epic.  WILL TAKE PRILOSEC AM DOS W/ SIPS OF WATER AND DO FLEET ENEMA.

## 2017-10-08 ENCOUNTER — Ambulatory Visit (HOSPITAL_COMMUNITY)
Admission: RE | Admit: 2017-10-08 | Discharge: 2017-10-08 | Disposition: A | Discharge status: Home / Self Care | Payer: Medicare HMO | Source: Ambulatory Visit | Attending: Urology | Admitting: Urology

## 2017-10-08 ENCOUNTER — Encounter (HOSPITAL_COMMUNITY)
Admission: RE | Admit: 2017-10-08 | Discharge: 2017-10-08 | Disposition: A | Discharge status: Home / Self Care | Payer: Medicare HMO | Source: Ambulatory Visit | Attending: Urology | Admitting: Urology

## 2017-10-08 DIAGNOSIS — Z01818 Encounter for other preprocedural examination: Secondary | ICD-10-CM | POA: Insufficient documentation

## 2017-10-08 DIAGNOSIS — Z01812 Encounter for preprocedural laboratory examination: Secondary | ICD-10-CM | POA: Insufficient documentation

## 2017-10-08 DIAGNOSIS — J9811 Atelectasis: Secondary | ICD-10-CM | POA: Diagnosis not present

## 2017-10-08 LAB — COMPREHENSIVE METABOLIC PANEL
ALK PHOS: 58 U/L (ref 38–126)
ALT: 29 U/L (ref 17–63)
AST: 25 U/L (ref 15–41)
Albumin: 4.3 g/dL (ref 3.5–5.0)
Anion gap: 8 (ref 5–15)
BILIRUBIN TOTAL: 0.9 mg/dL (ref 0.3–1.2)
BUN: 19 mg/dL (ref 6–20)
CALCIUM: 9.5 mg/dL (ref 8.9–10.3)
CO2: 25 mmol/L (ref 22–32)
CREATININE: 0.81 mg/dL (ref 0.61–1.24)
Chloride: 106 mmol/L (ref 101–111)
Glucose, Bld: 98 mg/dL (ref 65–99)
Potassium: 3.8 mmol/L (ref 3.5–5.1)
Sodium: 139 mmol/L (ref 135–145)
TOTAL PROTEIN: 6.9 g/dL (ref 6.5–8.1)

## 2017-10-08 LAB — CBC
HEMATOCRIT: 43.9 % (ref 39.0–52.0)
Hemoglobin: 14.7 g/dL (ref 13.0–17.0)
MCH: 30.6 pg (ref 26.0–34.0)
MCHC: 33.5 g/dL (ref 30.0–36.0)
MCV: 91.3 fL (ref 78.0–100.0)
PLATELETS: 312 10*3/uL (ref 150–400)
RBC: 4.81 MIL/uL (ref 4.22–5.81)
RDW: 13.6 % (ref 11.5–15.5)
WBC: 6 10*3/uL (ref 4.0–10.5)

## 2017-10-08 LAB — PROTIME-INR
INR: 1
PROTHROMBIN TIME: 13.1 s (ref 11.4–15.2)

## 2017-10-08 LAB — APTT: aPTT: 31 seconds (ref 24–36)

## 2017-10-09 ENCOUNTER — Other Ambulatory Visit: Payer: Self-pay | Admitting: Urology

## 2017-10-09 DIAGNOSIS — C61 Malignant neoplasm of prostate: Secondary | ICD-10-CM

## 2017-10-16 ENCOUNTER — Encounter (HOSPITAL_BASED_OUTPATIENT_CLINIC_OR_DEPARTMENT_OTHER)
Admission: RE | Disposition: A | Discharge status: Home / Self Care | Payer: Self-pay | Source: Ambulatory Visit | Attending: Urology

## 2017-10-16 ENCOUNTER — Ambulatory Visit (HOSPITAL_BASED_OUTPATIENT_CLINIC_OR_DEPARTMENT_OTHER)
Admission: RE | Admit: 2017-10-16 | Discharge: 2017-10-16 | Disposition: A | Discharge status: Home / Self Care | Payer: Medicare HMO | Source: Ambulatory Visit | Attending: Urology | Admitting: Urology

## 2017-10-16 ENCOUNTER — Ambulatory Visit (HOSPITAL_BASED_OUTPATIENT_CLINIC_OR_DEPARTMENT_OTHER): Payer: Medicare HMO | Admitting: Anesthesiology

## 2017-10-16 ENCOUNTER — Other Ambulatory Visit (HOSPITAL_COMMUNITY): Payer: Medicare HMO

## 2017-10-16 ENCOUNTER — Encounter (HOSPITAL_BASED_OUTPATIENT_CLINIC_OR_DEPARTMENT_OTHER): Payer: Self-pay

## 2017-10-16 DIAGNOSIS — Z79899 Other long term (current) drug therapy: Secondary | ICD-10-CM | POA: Insufficient documentation

## 2017-10-16 DIAGNOSIS — K219 Gastro-esophageal reflux disease without esophagitis: Secondary | ICD-10-CM | POA: Diagnosis not present

## 2017-10-16 DIAGNOSIS — Z7982 Long term (current) use of aspirin: Secondary | ICD-10-CM | POA: Diagnosis not present

## 2017-10-16 DIAGNOSIS — E7800 Pure hypercholesterolemia, unspecified: Secondary | ICD-10-CM | POA: Diagnosis not present

## 2017-10-16 DIAGNOSIS — Z87891 Personal history of nicotine dependence: Secondary | ICD-10-CM | POA: Insufficient documentation

## 2017-10-16 DIAGNOSIS — N400 Enlarged prostate without lower urinary tract symptoms: Secondary | ICD-10-CM | POA: Diagnosis not present

## 2017-10-16 DIAGNOSIS — C61 Malignant neoplasm of prostate: Secondary | ICD-10-CM | POA: Diagnosis not present

## 2017-10-16 DIAGNOSIS — Z888 Allergy status to other drugs, medicaments and biological substances status: Secondary | ICD-10-CM | POA: Diagnosis not present

## 2017-10-16 DIAGNOSIS — I10 Essential (primary) hypertension: Secondary | ICD-10-CM | POA: Diagnosis not present

## 2017-10-16 DIAGNOSIS — E785 Hyperlipidemia, unspecified: Secondary | ICD-10-CM | POA: Diagnosis not present

## 2017-10-16 DIAGNOSIS — Z8546 Personal history of malignant neoplasm of prostate: Secondary | ICD-10-CM

## 2017-10-16 HISTORY — DX: Nocturia: R35.1

## 2017-10-16 HISTORY — PX: GOLD SEED IMPLANT: SHX6343

## 2017-10-16 HISTORY — PX: SPACE OAR INSTILLATION: SHX6769

## 2017-10-16 HISTORY — DX: Benign prostatic hyperplasia without lower urinary tract symptoms: N40.0

## 2017-10-16 SURGERY — INSERTION, GOLD SEEDS
Anesthesia: General

## 2017-10-16 MED ORDER — DEXAMETHASONE SODIUM PHOSPHATE 10 MG/ML IJ SOLN
INTRAMUSCULAR | Status: DC | PRN
  Administered 2017-10-16: 10 mg via INTRAVENOUS

## 2017-10-16 MED ORDER — SODIUM CHLORIDE FLUSH 0.9 % IV SOLN
INTRAVENOUS | Status: DC | PRN
  Administered 2017-10-16: 50 mL via INTRAVENOUS

## 2017-10-16 MED ORDER — LIDOCAINE 2% (20 MG/ML) 5 ML SYRINGE
INTRAMUSCULAR | Status: AC
  Filled 2017-10-16: qty 5

## 2017-10-16 MED ORDER — LIDOCAINE 2% (20 MG/ML) 5 ML SYRINGE
INTRAMUSCULAR | Status: DC | PRN
  Administered 2017-10-16: 60 mg via INTRAVENOUS

## 2017-10-16 MED ORDER — ONDANSETRON HCL 4 MG/2ML IJ SOLN
INTRAMUSCULAR | Status: AC
  Filled 2017-10-16: qty 2

## 2017-10-16 MED ORDER — HYDROMORPHONE HCL 1 MG/ML IJ SOLN
0.2500 mg | INTRAMUSCULAR | Status: DC | PRN
  Filled 2017-10-16: qty 0.5

## 2017-10-16 MED ORDER — ONDANSETRON HCL 4 MG/2ML IJ SOLN
INTRAMUSCULAR | Status: DC | PRN
  Administered 2017-10-16: 4 mg via INTRAVENOUS

## 2017-10-16 MED ORDER — FENTANYL CITRATE (PF) 100 MCG/2ML IJ SOLN
INTRAMUSCULAR | Status: DC | PRN
  Administered 2017-10-16 (×2): 50 ug via INTRAVENOUS

## 2017-10-16 MED ORDER — OXYCODONE HCL 5 MG PO TABS
5.0000 mg | ORAL_TABLET | Freq: Once | ORAL | Status: DC | PRN
  Filled 2017-10-16: qty 1

## 2017-10-16 MED ORDER — PROPOFOL 10 MG/ML IV BOLUS
INTRAVENOUS | Status: AC
  Filled 2017-10-16: qty 20

## 2017-10-16 MED ORDER — MIDAZOLAM HCL 2 MG/2ML IJ SOLN
INTRAMUSCULAR | Status: AC
  Filled 2017-10-16: qty 2

## 2017-10-16 MED ORDER — MIDAZOLAM HCL 2 MG/2ML IJ SOLN
INTRAMUSCULAR | Status: DC | PRN
  Administered 2017-10-16: 1 mg via INTRAVENOUS

## 2017-10-16 MED ORDER — FLEET ENEMA 7-19 GM/118ML RE ENEM
1.0000 | ENEMA | Freq: Once | RECTAL | Status: AC
  Administered 2017-10-16: 1 via RECTAL
  Filled 2017-10-16: qty 1

## 2017-10-16 MED ORDER — KETOROLAC TROMETHAMINE 30 MG/ML IJ SOLN
15.0000 mg | Freq: Once | INTRAMUSCULAR | Status: DC | PRN
  Filled 2017-10-16: qty 1

## 2017-10-16 MED ORDER — CIPROFLOXACIN IN D5W 400 MG/200ML IV SOLN
INTRAVENOUS | Status: AC
  Filled 2017-10-16: qty 200

## 2017-10-16 MED ORDER — CIPROFLOXACIN IN D5W 400 MG/200ML IV SOLN
400.0000 mg | INTRAVENOUS | Status: AC
  Administered 2017-10-16: 400 mg via INTRAVENOUS
  Filled 2017-10-16: qty 200

## 2017-10-16 MED ORDER — FENTANYL CITRATE (PF) 100 MCG/2ML IJ SOLN
INTRAMUSCULAR | Status: AC
  Filled 2017-10-16: qty 2

## 2017-10-16 MED ORDER — PROMETHAZINE HCL 25 MG/ML IJ SOLN
6.2500 mg | INTRAMUSCULAR | Status: DC | PRN
  Filled 2017-10-16: qty 1

## 2017-10-16 MED ORDER — DEXAMETHASONE SODIUM PHOSPHATE 10 MG/ML IJ SOLN
INTRAMUSCULAR | Status: AC
  Filled 2017-10-16: qty 1

## 2017-10-16 MED ORDER — EPHEDRINE 5 MG/ML INJ
INTRAVENOUS | Status: AC
  Filled 2017-10-16: qty 10

## 2017-10-16 MED ORDER — LACTATED RINGERS IV SOLN
INTRAVENOUS | Status: DC
  Administered 2017-10-16: 08:00:00 via INTRAVENOUS
  Filled 2017-10-16: qty 1000

## 2017-10-16 MED ORDER — PROPOFOL 10 MG/ML IV BOLUS
INTRAVENOUS | Status: DC | PRN
  Administered 2017-10-16: 140 mg via INTRAVENOUS

## 2017-10-16 MED ORDER — OXYCODONE HCL 5 MG/5ML PO SOLN
5.0000 mg | Freq: Once | ORAL | Status: DC | PRN
  Filled 2017-10-16: qty 5

## 2017-10-16 SURGICAL SUPPLY — 15 items
COVER TABLE BACK 60X90 (DRAPES) ×2 IMPLANT
DRSG TEGADERM 4X4.75 (GAUZE/BANDAGES/DRESSINGS) ×2 IMPLANT
DRSG TEGADERM 8X12 (GAUZE/BANDAGES/DRESSINGS) ×2 IMPLANT
GAUZE SPONGE 4X4 12PLY STRL LF (GAUZE/BANDAGES/DRESSINGS) ×1 IMPLANT
GLOVE BIO SURGEON STRL SZ7.5 (GLOVE) ×2 IMPLANT
GLOVE ECLIPSE 8.0 STRL XLNG CF (GLOVE) ×2 IMPLANT
GOWN STRL REUS W/TWL XL LVL3 (GOWN DISPOSABLE) ×2 IMPLANT
IMPL SPACEOAR SYSTEM 10ML (MISCELLANEOUS) ×1 IMPLANT
IMPLANT SPACEOAR SYSTEM 10ML (MISCELLANEOUS) ×2
KIT RM TURNOVER CYSTO AR (KITS) ×2 IMPLANT
MARKER GOLD PRELOAD 1.2X3 (Urological Implant) ×1 IMPLANT
PACK CYSTO (CUSTOM PROCEDURE TRAY) ×2 IMPLANT
SEED GOLD PRELOAD 1.2X3 (Urological Implant) ×2 IMPLANT
SURGILUBE 2OZ TUBE FLIPTOP (MISCELLANEOUS) ×2 IMPLANT
UNDERPAD 30X30 INCONTINENT (UNDERPADS AND DIAPERS) ×3 IMPLANT

## 2017-10-16 NOTE — Anesthesia Preprocedure Evaluation (Signed)
Anesthesia Evaluation  Patient identified by MRN, date of birth, ID band Patient awake    Reviewed: Allergy & Precautions, NPO status , Patient's Chart, lab work & pertinent test results  Airway Mallampati: II  TM Distance: >3 FB Neck ROM: Full    Dental no notable dental hx.    Pulmonary neg pulmonary ROS, former smoker,    Pulmonary exam normal breath sounds clear to auscultation       Cardiovascular Normal cardiovascular exam Rhythm:Regular Rate:Normal     Neuro/Psych negative neurological ROS  negative psych ROS   GI/Hepatic Neg liver ROS, GERD  Medicated,  Endo/Other  negative endocrine ROS  Renal/GU negative Renal ROS  negative genitourinary   Musculoskeletal negative musculoskeletal ROS (+)   Abdominal   Peds negative pediatric ROS (+)  Hematology negative hematology ROS (+)   Anesthesia Other Findings   Reproductive/Obstetrics negative OB ROS                             Anesthesia Physical Anesthesia Plan  ASA: II  Anesthesia Plan: General   Post-op Pain Management:    Induction: Intravenous  PONV Risk Score and Plan: 2 and Ondansetron, Dexamethasone and Treatment may vary due to age or medical condition  Airway Management Planned: LMA  Additional Equipment:   Intra-op Plan:   Post-operative Plan: Extubation in OR  Informed Consent: I have reviewed the patients History and Physical, chart, labs and discussed the procedure including the risks, benefits and alternatives for the proposed anesthesia with the patient or authorized representative who has indicated his/her understanding and acceptance.   Dental advisory given  Plan Discussed with: CRNA and Surgeon  Anesthesia Plan Comments:         Anesthesia Quick Evaluation

## 2017-10-16 NOTE — Op Note (Signed)
Date of procedure: 10/16/17  Preoperative diagnosis:  1. Prostate cancer  Postoperative diagnosis:  1. Prostate cancer  Procedure: 1. Transrectal ultrasound prostate 2. Placement of 3 gold fiduciary markers within the prostate 3. Placement of SpaceOar   Surgeon: Baruch Gouty, MD  Radiation Oncologist: Tyler Pita, MD  Anesthesia: General  Complications: None  Intraoperative findings: Successful placement of 3 gold fiduciary markers and SpaceOar.  EBL: None  Specimens: None  Drains: None  Disposition: Stable to the postanesthesia care unit  Indication for procedure: The patient is a 71 y.o. male with history of prostate cancer who has elected to undergo external beam radiation therapy who presents today for gold fiduciary marker placement and SpaceOAR.  After reviewing the management options for treatment, the patient elected to proceed with the above surgical procedure(s). We have discussed the potential benefits and risks of the procedure, side effects of the proposed treatment, the likelihood of the patient achieving the goals of the procedure, and any potential problems that might occur during the procedure or recuperation. Informed consent has been obtained.  Description of procedure: The patient was met in the preoperative area. All risks, benefits, and indications of the procedure were described in great detail. The patient consented to the procedure. Preoperative antibiotics were given. The patient was taken to the operative theater. General anesthesia was induced per the anesthesia service. The patient was then placed in the high dorsal lithotomy position and prepped and draped in the usual sterile fashion. A preoperative timeout was called.   A transrectal ultrasound was then placed to the ultrasound the prostate.  Through the perineum, 3 gold fiduciary markers were placed.  These were within the right lateral base, left mid prostate, and right apex.  Next the  perineum, the SpaceOar needle was placed under ultrasound guidance directed into the plane between the prostate and the rectum.  It was advanced to the mid medial prostate gland.  Normal saline was then infused which showed good separation of the prostate and rectum.  There is good bilateral spread of the normal saline.  SpaceOar was then infused into the space over a 10-second timeframe.  There is good bilateral separation of the prostate and rectum.  The needle was withdrawn.  The ultrasound probe removed.  The patient was awoken from anesthesia and transferred in stable condition to the postanesthesia care unit.  Plan: The patient will follow up with his radiation oncologist, Dr. Tammi Klippel, for definitive management of his prostate cancer via external beam radiation.  We will arrange for him to undergo another dose of 45-monthLupron when he is due in approximately 6 weeks to complete his 667-monthoncurrent lupron therapy with radiation therapy.  He will see me in 6 months with a PSA prior.  BrBaruch GoutyM.D.

## 2017-10-16 NOTE — H&P (Signed)
CC/HPI: Pt presents today for placement of fiducial markers and Space Oar by Dr. Pilar Jarvis on 10/16/17.     CC: I have prostate cancer.  HPI: Dan Dudley is a 71 year-old male established patient who is here evaluation for treatment of prostate cancer.  HX:    PSA at diagnosis was 9.03.   Prostate biopsy showed Gleason 4+3 = 7 cancer in 3 of 12 cores   Biopsy:  4+3=7 in RMA <5%  3+3=6 in RML, RAL, <5%, 30%   The patient does not easily obtain erections. His erectile function is not important to him.   His prostate cancer was diagnosed 07/15/2017. He does have the pathology report from his biopsy. His PSA at his time of diagnosis was 9.03.     ALLERGIES: No Allergies    MEDICATIONS: Aspirin 325 mg tablet tablet  Motrin Ib  Omeprazole 20 mg tablet, delayed release Oral     GU PSH: Prostate Needle Biopsy - 07/15/2017      PSH Notes: Inguinal Hernia Repair, Inguinal Hernia Repair   NON-GU PSH: Hernia Repair - 2000, 1995, 1980 Tonsillectomy    GU PMH: Prostate Cancer - 07/29/2017 BPH w/o LUTS - 06/19/2017 Elevated PSA - 06/19/2017, Elevated prostate specific antigen (PSA), - 2016 Atypia of the prostate, Atypical small acinar proliferation of prostate - 2016 BPH w/LUTS, Benign localized prostatic hyperplasia with lower urinary tract symptoms (LUTS) - 2015 Nocturia, Nocturia - 2015    NON-GU PMH: Encounter for general adult medical examination without abnormal findings, Encounter for preventive health examination - 2016 Personal history of other diseases of the musculoskeletal system and connective tissue, History of arthritis - 2015 Personal history of other endocrine, nutritional and metabolic disease, History of hypercholesterolemia - 2015 GERD Hypercholesterolemia    FAMILY HISTORY: 1 Daughter - Daughter 1 son - Son cardiac disorder - Runs In Family   SOCIAL HISTORY: Marital Status: Married Preferred Language: English; Ethnicity: Not Hispanic Or Latino; Race:  White Current Smoking Status: Patient does not smoke anymore. Has not smoked since 06/19/1972. Smoked for 6 years.   Tobacco Use Assessment Completed: Used Tobacco in last 30 days? Does not use smokeless tobacco. Social Drinker.  Does not use drugs. Drinks 3 caffeinated drinks per day. Has not had a blood transfusion. Patient's occupation is/was Retired.     Notes: Former smoker, Father deceased, Alcohol use, Occupation, Mother deceased, Caffeine use, Married, Retired, History of smoking   REVIEW OF SYSTEMS:    GU Review Male:   Patient reports frequent urination and get up at night to urinate. Patient denies hard to postpone urination, burning/ pain with urination, leakage of urine, stream starts and stops, trouble starting your stream, have to strain to urinate , erection problems, and penile pain.  Gastrointestinal (Upper):   Patient reports indigestion/ heartburn. Patient denies nausea and vomiting.  Gastrointestinal (Lower):   Patient denies diarrhea and constipation.  Constitutional:   Patient denies fever, night sweats, weight loss, and fatigue.  Skin:   Patient denies itching and skin rash/ lesion.  Eyes:   Patient denies blurred vision and double vision.  Ears/ Nose/ Throat:   Patient denies sore throat and sinus problems.  Hematologic/Lymphatic:   Patient denies swollen glands and easy bruising.  Cardiovascular:   Patient denies leg swelling and chest pains.  Respiratory:   Patient denies cough and shortness of breath.  Endocrine:   Patient denies excessive thirst.  Musculoskeletal:   Patient denies back pain and joint pain.  Neurological:   Patient  denies headaches and dizziness.  Psychologic:   mild. Patient reports anxiety.    VITAL SIGNS:      10/01/2017 03:56 PM  Weight 185 lb / 83.91 kg  Height 72 in / 182.88 cm  BP 147/75 mmHg  Pulse 70 /min  Temperature 98.1 F / 36.7 C  BMI 25.1 kg/m   MULTI-SYSTEM PHYSICAL EXAMINATION:    Constitutional: Well-nourished. No  physical deformities. Normally developed. Good grooming.  Neck: Neck symmetrical, not swollen. Normal tracheal position.  Respiratory: Normal breath sounds. No labored breathing, no use of accessory muscles.   Cardiovascular: Regular rate and rhythm. No murmur, no gallop. Normal temperature, normal extremity pulses, no swelling, no varicosities.   Lymphatic: No enlargement of neck, axillae, groin.  Skin: No paleness, no jaundice, no cyanosis. No lesion, no ulcer, no rash.  Neurologic / Psychiatric: Oriented to time, oriented to place, oriented to person. No depression, no anxiety, no agitation.  Gastrointestinal: No mass, no tenderness, no rigidity, non obese abdomen.  Eyes: Normal conjunctivae. Normal eyelids.  Ears, Nose, Mouth, and Throat: Left ear no scars, no lesions, no masses. Right ear no scars, no lesions, no masses. Nose no scars, no lesions, no masses. Normal hearing. Normal lips.  Musculoskeletal: Normal gait and station of head and neck.     PAST DATA REVIEWED:  Source Of History:  Patient  Records Review:   Previous Patient Records  Urine Test Review:   Urinalysis   02/22/15  PSA  Total PSA 6.16   Free PSA 1.14   % Free PSA 19     10/01/17  Urinalysis  Urine Appearance Clear   Urine Color Yellow   Urine Glucose Neg   Urine Bilirubin Neg   Urine Ketones Neg   Urine Specific Gravity 1.015   Urine Blood Neg   Urine pH 6.5   Urine Protein Neg   Urine Urobilinogen 0.2   Urine Nitrites Neg   Urine Leukocyte Esterase Neg    PROCEDURES:          Urinalysis Dipstick Dipstick Cont'd  Color: Yellow Bilirubin: Neg  Appearance: Clear Ketones: Neg  Specific Gravity: 1.015 Blood: Neg  pH: 6.5 Protein: Neg  Glucose: Neg Urobilinogen: 0.2    Nitrites: Neg    Leukocyte Esterase: Neg    ASSESSMENT:      ICD-10 Details  1 GU:   Prostate Cancer - C61   2   Elevated PSA - R97.20    PLAN:           Schedule Return Visit/Planned Activity: Keep Scheduled Appointment -  Schedule Surgery          Document Letter(s):  Created for Patient: Clinical Summary         Notes:    Pt is scheduled to undergo fiducial marker placement and Space Oar on 10/16/17.

## 2017-10-16 NOTE — Discharge Instructions (Signed)
[facility] for Prostate Cancer, Care After Refer to this sheet in the next few weeks. These instructions provide you with information on caring for yourself after your procedure. Your health care provider may also give you more specific instructions. Your treatment has been planned according to current medical practices, but problems sometimes occur. Call your health care provider if you have any problems or questions after your procedure. What can I expect after the procedure? The area behind the scrotum will probably be tender and bruised. For a short period of time you may have:  Difficulty passing urine. You may need a catheter for a few days to a month.  Blood in the urine or semen.  A feeling of constipation because of prostate swelling.  Frequent feeling of an urgent need to urinate.  For a long period of time you may have:  Inflammation of the rectum. This happens in about 2% of people who have the procedure.  Erection problems. These vary with age and occur in about 15-40% of men.  Difficulty urinating. This is caused by scarring in the urethra.  Diarrhea.  Follow these instructions at home:  Take medicines only as directed by your health care provider.  You will probably have a catheter in your bladder for several days. You will have blood in the urine bag and should drink a lot of fluids to keep it a light red color.  Keep all follow-up visits as directed by your health care provider. If you have a catheter, it will be removed during one of these visits.  Try not to sit directly on the area behind the scrotum. A soft cushion can decrease the discomfort. Ice packs may also be helpful for the discomfort. Do not put ice directly on the skin.  Shower and wash the area behind the scrotum gently. Do not sit in a tub.   Get help right away if:  You have a fever.  You have chills.  You have shortness of breath.  You have chest pain.  There is excessive bleeding from  your rectum. It is normal to have a little blood mixed with your stool.  There is severe discomfort in the treated area that does not go away with pain medicine.  You have abdominal discomfort.  You have severe nausea or vomiting.  You develop any new or unusual symptoms. This information is not intended to replace advice given to you by your health care provider. Make sure you discuss any questions you have with your health care provider. Document Released: 12/08/2010 Document Revised: 04/18/2016 Document Reviewed: 04/28/2013 Elsevier Interactive Patient Education  2017 [facility] Instructions  Activity: Get plenty of rest for the remainder of the day. A responsible individual must stay with you for 24 hours following the procedure.  For the next 24 hours, DO NOT: -Drive a car -Paediatric nurse -Drink alcoholic beverages -Take any medication unless instructed by your physician -Make any legal decisions or sign important papers.  Meals: Start with liquid foods such as gelatin or soup. Progress to regular foods as tolerated. Avoid greasy, spicy, heavy foods. If nausea and/or vomiting occur, drink only clear liquids until the nausea and/or vomiting subsides. Call your physician if vomiting continues.  Special Instructions/Symptoms: Your throat may feel dry or sore from the anesthesia or the breathing tube placed in your throat during surgery. If this causes discomfort, gargle with warm salt water. The discomfort should disappear within 24 hours.  If you had  a scopolamine patch placed behind your ear for the management of post- operative nausea and/or vomiting:  1. The medication in the patch is effective for 72 hours, after which it should be removed.  Wrap patch in a tissue and discard in the trash. Wash hands thoroughly with soap and water. 2. You may remove the patch earlier than 72 hours if you experience unpleasant side effects which may include dry  mouth, dizziness or visual disturbances. 3. Avoid touching the patch. Wash your hands with soap and water after contact with the patch.   Call your surgeon if you experience:   1.  Fever over 101.0. 2.  Inability to urinate. 3.  Nausea and/or vomiting. 4.  Extreme swelling or bruising at the surgical site. 5.  Continued bleeding from the incision. 6.  Increased pain, redness or drainage from the incision. 7.  Problems related to your pain medication. 8.  Any problems and/or concerns

## 2017-10-16 NOTE — Transfer of Care (Signed)
Immediate Anesthesia Transfer of Care Note  Patient: Dan Dudley  Procedure(s) Performed: GOLD SEED IMPLANT (N/A ) SPACE OAR INSTILLATION (N/A )  Patient Location: PACU  Anesthesia Type:General  Level of Consciousness: awake, alert , oriented and patient cooperative  Airway & Oxygen Therapy: Patient Spontanous Breathing and Patient connected to nasal cannula oxygen  Post-op Assessment: Report given to RN and Post -op Vital signs reviewed and stable  Post vital signs: Reviewed and stable  Last Vitals:  Vitals:   10/16/17 0748  BP: (!) 141/85  Pulse: 79  Resp: 18  Temp: 36.4 C  SpO2: 99%    Last Pain:  Vitals:   10/16/17 0748  TempSrc: Oral      Patients Stated Pain Goal: 5 (69/62/95 [id number])  Complications: No apparent anesthesia complications

## 2017-10-16 NOTE — Anesthesia Procedure Notes (Signed)
Procedure Name: LMA Insertion Date/Time: 10/16/2017 10:33 AM Performed by: Wanita Chamberlain, CRNA Pre-anesthesia Checklist: Patient identified, Emergency Drugs available, Suction available, Patient being monitored and Timeout performed Patient Re-evaluated:Patient Re-evaluated prior to induction Oxygen Delivery Method: Circle system utilized Preoxygenation: Pre-oxygenation with 100% oxygen Induction Type: IV induction Ventilation: Mask ventilation without difficulty LMA: LMA inserted LMA Size: 4.0 Number of attempts: 1 Placement Confirmation: breath sounds checked- equal and bilateral and positive ETCO2 Tube secured with: Tape Dental Injury: Teeth and Oropharynx as per pre-operative assessment

## 2017-10-16 NOTE — Anesthesia Postprocedure Evaluation (Signed)
Anesthesia Post Note  Patient: Dan Dudley  Procedure(s) Performed: GOLD SEED IMPLANT (N/A ) SPACE OAR INSTILLATION (N/A )     Patient location during evaluation: PACU Anesthesia Type: General Level of consciousness: awake and alert Pain management: pain level controlled Vital Signs Assessment: post-procedure vital signs reviewed and stable Respiratory status: spontaneous breathing, nonlabored ventilation, respiratory function stable and patient connected to nasal cannula oxygen Cardiovascular status: blood pressure returned to baseline and stable Postop Assessment: no apparent nausea or vomiting Anesthetic complications: no    Last Vitals:  Vitals:   10/16/17 1059 10/16/17 1100  BP: 118/78 115/76  Pulse: 68 70  Resp: 16 (!) 23  Temp: 36.8 C   SpO2: 96% 96%    Last Pain:  Vitals:   10/16/17 0748  TempSrc: Oral                 Dan Dudley S

## 2017-10-17 ENCOUNTER — Encounter (HOSPITAL_BASED_OUTPATIENT_CLINIC_OR_DEPARTMENT_OTHER): Payer: Self-pay | Admitting: Urology

## 2017-10-18 ENCOUNTER — Encounter: Payer: Self-pay | Admitting: Medical Oncology

## 2017-10-18 ENCOUNTER — Ambulatory Visit
Admission: RE | Admit: 2017-10-18 | Discharge: 2017-10-18 | Disposition: A | Discharge status: Home / Self Care | Payer: Medicare HMO | Source: Ambulatory Visit | Attending: Radiation Oncology | Admitting: Radiation Oncology

## 2017-10-18 DIAGNOSIS — Z510 Encounter for antineoplastic radiation therapy: Secondary | ICD-10-CM | POA: Diagnosis not present

## 2017-10-18 DIAGNOSIS — R351 Nocturia: Secondary | ICD-10-CM | POA: Diagnosis not present

## 2017-10-18 DIAGNOSIS — I10 Essential (primary) hypertension: Secondary | ICD-10-CM | POA: Diagnosis not present

## 2017-10-18 DIAGNOSIS — N529 Male erectile dysfunction, unspecified: Secondary | ICD-10-CM | POA: Diagnosis not present

## 2017-10-18 DIAGNOSIS — R350 Frequency of micturition: Secondary | ICD-10-CM | POA: Diagnosis not present

## 2017-10-18 DIAGNOSIS — C61 Malignant neoplasm of prostate: Secondary | ICD-10-CM | POA: Diagnosis not present

## 2017-10-18 DIAGNOSIS — K219 Gastro-esophageal reflux disease without esophagitis: Secondary | ICD-10-CM | POA: Diagnosis not present

## 2017-10-18 DIAGNOSIS — E785 Hyperlipidemia, unspecified: Secondary | ICD-10-CM | POA: Diagnosis not present

## 2017-10-18 DIAGNOSIS — Z8249 Family history of ischemic heart disease and other diseases of the circulatory system: Secondary | ICD-10-CM | POA: Diagnosis not present

## 2017-10-18 DIAGNOSIS — Z87891 Personal history of nicotine dependence: Secondary | ICD-10-CM | POA: Diagnosis not present

## 2017-10-18 NOTE — Progress Notes (Signed)
  Radiation Oncology         [phone] ________________________________  Name: MARQUIZE SEIB MRN: [id number]  Date: 10/18/2017  DOB: 08-01-1946  SIMULATION AND TREATMENT PLANNING NOTE    ICD-10-CM   1. Stage 1 adenocarcinoma of prostate ([place]) C61     DIAGNOSIS:  71 y.o. gentleman with Stage T1c adenocarcinoma of the prostate with Gleason Score of 4+3, and PSA of 9.06  NARRATIVE:  The patient was brought to the [facility].  Identity was confirmed.  All relevant records and images related to the planned course of therapy were reviewed.  The patient freely provided informed written consent to proceed with treatment after reviewing the details related to the planned course of therapy. The consent form was witnessed and verified by the simulation staff.  Then, the patient was set-up in a stable reproducible supine position for radiation therapy.  A vacuum lock pillow device was custom fabricated to position his legs in a reproducible immobilized position.  Then, I performed a urethrogram under sterile conditions to identify the prostatic apex.  CT images were obtained.  Surface markings were placed.  The CT images were loaded into the planning software.  Then the prostate target and avoidance structures including the rectum, bladder, bowel and hips were contoured.  Treatment planning then occurred.  The radiation prescription was entered and confirmed.  A total of one complex treatment devices was fabricated. I have requested : Intensity Modulated Radiotherapy (IMRT) is medically necessary for this case for the following reason:  Rectal sparing.Marland Kitchen  PLAN:  The patient will receive 78 Gy in 40 fractions.  ________________________________  Sheral Apley Tammi Klippel, M.D.  This document serves as a record of services personally performed by Tyler Pita, MD. It was created on his behalf by Rae Lips, a trained medical scribe. The creation of this record is based on the  scribe's personal observations and the provider's statements to them. This document has been checked and approved by the attending provider.

## 2017-10-21 ENCOUNTER — Ambulatory Visit (HOSPITAL_COMMUNITY)
Admission: RE | Admit: 2017-10-21 | Discharge: 2017-10-21 | Disposition: A | Discharge status: Home / Self Care | Payer: Medicare HMO | Source: Ambulatory Visit | Attending: Urology | Admitting: Urology

## 2017-10-21 DIAGNOSIS — C61 Malignant neoplasm of prostate: Secondary | ICD-10-CM | POA: Diagnosis not present

## 2017-10-28 DIAGNOSIS — Z8249 Family history of ischemic heart disease and other diseases of the circulatory system: Secondary | ICD-10-CM | POA: Diagnosis not present

## 2017-10-28 DIAGNOSIS — E785 Hyperlipidemia, unspecified: Secondary | ICD-10-CM | POA: Diagnosis not present

## 2017-10-28 DIAGNOSIS — K219 Gastro-esophageal reflux disease without esophagitis: Secondary | ICD-10-CM | POA: Diagnosis not present

## 2017-10-28 DIAGNOSIS — R351 Nocturia: Secondary | ICD-10-CM | POA: Diagnosis not present

## 2017-10-28 DIAGNOSIS — C61 Malignant neoplasm of prostate: Secondary | ICD-10-CM | POA: Diagnosis not present

## 2017-10-28 DIAGNOSIS — I10 Essential (primary) hypertension: Secondary | ICD-10-CM | POA: Diagnosis not present

## 2017-10-28 DIAGNOSIS — Z510 Encounter for antineoplastic radiation therapy: Secondary | ICD-10-CM | POA: Diagnosis not present

## 2017-10-28 DIAGNOSIS — Z87891 Personal history of nicotine dependence: Secondary | ICD-10-CM | POA: Diagnosis not present

## 2017-10-28 DIAGNOSIS — N529 Male erectile dysfunction, unspecified: Secondary | ICD-10-CM | POA: Diagnosis not present

## 2017-10-28 DIAGNOSIS — R350 Frequency of micturition: Secondary | ICD-10-CM | POA: Diagnosis not present

## 2017-10-29 ENCOUNTER — Ambulatory Visit: Payer: Medicare HMO | Admitting: Radiation Oncology

## 2017-10-29 DIAGNOSIS — Z8249 Family history of ischemic heart disease and other diseases of the circulatory system: Secondary | ICD-10-CM | POA: Diagnosis not present

## 2017-10-29 DIAGNOSIS — Z87891 Personal history of nicotine dependence: Secondary | ICD-10-CM | POA: Diagnosis not present

## 2017-10-29 DIAGNOSIS — R350 Frequency of micturition: Secondary | ICD-10-CM | POA: Diagnosis not present

## 2017-10-29 DIAGNOSIS — Z510 Encounter for antineoplastic radiation therapy: Secondary | ICD-10-CM | POA: Diagnosis not present

## 2017-10-29 DIAGNOSIS — K219 Gastro-esophageal reflux disease without esophagitis: Secondary | ICD-10-CM | POA: Diagnosis not present

## 2017-10-29 DIAGNOSIS — C61 Malignant neoplasm of prostate: Secondary | ICD-10-CM | POA: Diagnosis not present

## 2017-10-29 DIAGNOSIS — N529 Male erectile dysfunction, unspecified: Secondary | ICD-10-CM | POA: Diagnosis not present

## 2017-10-29 DIAGNOSIS — I10 Essential (primary) hypertension: Secondary | ICD-10-CM | POA: Diagnosis not present

## 2017-10-29 DIAGNOSIS — R351 Nocturia: Secondary | ICD-10-CM | POA: Diagnosis not present

## 2017-10-29 DIAGNOSIS — E785 Hyperlipidemia, unspecified: Secondary | ICD-10-CM | POA: Diagnosis not present

## 2017-10-30 ENCOUNTER — Ambulatory Visit
Admission: RE | Admit: 2017-10-30 | Discharge: 2017-10-30 | Disposition: A | Discharge status: Home / Self Care | Payer: Medicare HMO | Source: Ambulatory Visit | Attending: Radiation Oncology | Admitting: Radiation Oncology

## 2017-10-30 DIAGNOSIS — E785 Hyperlipidemia, unspecified: Secondary | ICD-10-CM | POA: Diagnosis not present

## 2017-10-30 DIAGNOSIS — Z87891 Personal history of nicotine dependence: Secondary | ICD-10-CM | POA: Diagnosis not present

## 2017-10-30 DIAGNOSIS — Z510 Encounter for antineoplastic radiation therapy: Secondary | ICD-10-CM | POA: Diagnosis not present

## 2017-10-30 DIAGNOSIS — N529 Male erectile dysfunction, unspecified: Secondary | ICD-10-CM | POA: Diagnosis not present

## 2017-10-30 DIAGNOSIS — Z8249 Family history of ischemic heart disease and other diseases of the circulatory system: Secondary | ICD-10-CM | POA: Diagnosis not present

## 2017-10-30 DIAGNOSIS — R351 Nocturia: Secondary | ICD-10-CM | POA: Diagnosis not present

## 2017-10-30 DIAGNOSIS — K219 Gastro-esophageal reflux disease without esophagitis: Secondary | ICD-10-CM | POA: Diagnosis not present

## 2017-10-30 DIAGNOSIS — R350 Frequency of micturition: Secondary | ICD-10-CM | POA: Diagnosis not present

## 2017-10-30 DIAGNOSIS — C61 Malignant neoplasm of prostate: Secondary | ICD-10-CM | POA: Diagnosis not present

## 2017-10-30 DIAGNOSIS — I10 Essential (primary) hypertension: Secondary | ICD-10-CM | POA: Diagnosis not present

## 2017-10-31 ENCOUNTER — Ambulatory Visit
Admission: RE | Admit: 2017-10-31 | Discharge: 2017-10-31 | Disposition: A | Discharge status: Home / Self Care | Payer: Medicare HMO | Source: Ambulatory Visit | Attending: Radiation Oncology | Admitting: Radiation Oncology

## 2017-10-31 DIAGNOSIS — E785 Hyperlipidemia, unspecified: Secondary | ICD-10-CM | POA: Diagnosis not present

## 2017-10-31 DIAGNOSIS — I10 Essential (primary) hypertension: Secondary | ICD-10-CM | POA: Diagnosis not present

## 2017-10-31 DIAGNOSIS — Z510 Encounter for antineoplastic radiation therapy: Secondary | ICD-10-CM | POA: Diagnosis not present

## 2017-10-31 DIAGNOSIS — R350 Frequency of micturition: Secondary | ICD-10-CM | POA: Diagnosis not present

## 2017-10-31 DIAGNOSIS — Z8249 Family history of ischemic heart disease and other diseases of the circulatory system: Secondary | ICD-10-CM | POA: Diagnosis not present

## 2017-10-31 DIAGNOSIS — Z87891 Personal history of nicotine dependence: Secondary | ICD-10-CM | POA: Diagnosis not present

## 2017-10-31 DIAGNOSIS — N529 Male erectile dysfunction, unspecified: Secondary | ICD-10-CM | POA: Diagnosis not present

## 2017-10-31 DIAGNOSIS — K219 Gastro-esophageal reflux disease without esophagitis: Secondary | ICD-10-CM | POA: Diagnosis not present

## 2017-10-31 DIAGNOSIS — C61 Malignant neoplasm of prostate: Secondary | ICD-10-CM | POA: Diagnosis not present

## 2017-10-31 DIAGNOSIS — R351 Nocturia: Secondary | ICD-10-CM | POA: Diagnosis not present

## 2017-11-01 ENCOUNTER — Ambulatory Visit
Admission: RE | Admit: 2017-11-01 | Discharge: 2017-11-01 | Disposition: A | Discharge status: Home / Self Care | Payer: Medicare HMO | Source: Ambulatory Visit | Attending: Radiation Oncology | Admitting: Radiation Oncology

## 2017-11-01 DIAGNOSIS — K219 Gastro-esophageal reflux disease without esophagitis: Secondary | ICD-10-CM | POA: Diagnosis not present

## 2017-11-01 DIAGNOSIS — C61 Malignant neoplasm of prostate: Secondary | ICD-10-CM | POA: Diagnosis not present

## 2017-11-01 DIAGNOSIS — N529 Male erectile dysfunction, unspecified: Secondary | ICD-10-CM | POA: Diagnosis not present

## 2017-11-01 DIAGNOSIS — Z510 Encounter for antineoplastic radiation therapy: Secondary | ICD-10-CM | POA: Diagnosis not present

## 2017-11-01 DIAGNOSIS — Z87891 Personal history of nicotine dependence: Secondary | ICD-10-CM | POA: Diagnosis not present

## 2017-11-01 DIAGNOSIS — R351 Nocturia: Secondary | ICD-10-CM | POA: Diagnosis not present

## 2017-11-01 DIAGNOSIS — R350 Frequency of micturition: Secondary | ICD-10-CM | POA: Diagnosis not present

## 2017-11-01 DIAGNOSIS — E785 Hyperlipidemia, unspecified: Secondary | ICD-10-CM | POA: Diagnosis not present

## 2017-11-01 DIAGNOSIS — Z8249 Family history of ischemic heart disease and other diseases of the circulatory system: Secondary | ICD-10-CM | POA: Diagnosis not present

## 2017-11-01 DIAGNOSIS — I10 Essential (primary) hypertension: Secondary | ICD-10-CM | POA: Diagnosis not present

## 2017-11-04 ENCOUNTER — Ambulatory Visit
Admission: RE | Admit: 2017-11-04 | Discharge: 2017-11-04 | Disposition: A | Discharge status: Home / Self Care | Payer: Medicare HMO | Source: Ambulatory Visit | Attending: Radiation Oncology | Admitting: Radiation Oncology

## 2017-11-04 DIAGNOSIS — Z8249 Family history of ischemic heart disease and other diseases of the circulatory system: Secondary | ICD-10-CM | POA: Diagnosis not present

## 2017-11-04 DIAGNOSIS — R351 Nocturia: Secondary | ICD-10-CM | POA: Diagnosis not present

## 2017-11-04 DIAGNOSIS — N529 Male erectile dysfunction, unspecified: Secondary | ICD-10-CM | POA: Diagnosis not present

## 2017-11-04 DIAGNOSIS — R350 Frequency of micturition: Secondary | ICD-10-CM | POA: Diagnosis not present

## 2017-11-04 DIAGNOSIS — Z87891 Personal history of nicotine dependence: Secondary | ICD-10-CM | POA: Diagnosis not present

## 2017-11-04 DIAGNOSIS — K219 Gastro-esophageal reflux disease without esophagitis: Secondary | ICD-10-CM | POA: Diagnosis not present

## 2017-11-04 DIAGNOSIS — Z510 Encounter for antineoplastic radiation therapy: Secondary | ICD-10-CM | POA: Diagnosis not present

## 2017-11-04 DIAGNOSIS — E785 Hyperlipidemia, unspecified: Secondary | ICD-10-CM | POA: Diagnosis not present

## 2017-11-04 DIAGNOSIS — I10 Essential (primary) hypertension: Secondary | ICD-10-CM | POA: Diagnosis not present

## 2017-11-04 DIAGNOSIS — C61 Malignant neoplasm of prostate: Secondary | ICD-10-CM | POA: Diagnosis not present

## 2017-11-05 ENCOUNTER — Ambulatory Visit
Admission: RE | Admit: 2017-11-05 | Discharge: 2017-11-05 | Disposition: A | Discharge status: Home / Self Care | Payer: Medicare HMO | Source: Ambulatory Visit | Attending: Radiation Oncology | Admitting: Radiation Oncology

## 2017-11-05 DIAGNOSIS — Z510 Encounter for antineoplastic radiation therapy: Secondary | ICD-10-CM | POA: Diagnosis not present

## 2017-11-05 DIAGNOSIS — E785 Hyperlipidemia, unspecified: Secondary | ICD-10-CM | POA: Diagnosis not present

## 2017-11-05 DIAGNOSIS — N529 Male erectile dysfunction, unspecified: Secondary | ICD-10-CM | POA: Diagnosis not present

## 2017-11-05 DIAGNOSIS — I10 Essential (primary) hypertension: Secondary | ICD-10-CM | POA: Diagnosis not present

## 2017-11-05 DIAGNOSIS — Z87891 Personal history of nicotine dependence: Secondary | ICD-10-CM | POA: Diagnosis not present

## 2017-11-05 DIAGNOSIS — Z8249 Family history of ischemic heart disease and other diseases of the circulatory system: Secondary | ICD-10-CM | POA: Diagnosis not present

## 2017-11-05 DIAGNOSIS — R350 Frequency of micturition: Secondary | ICD-10-CM | POA: Diagnosis not present

## 2017-11-05 DIAGNOSIS — K219 Gastro-esophageal reflux disease without esophagitis: Secondary | ICD-10-CM | POA: Diagnosis not present

## 2017-11-05 DIAGNOSIS — R351 Nocturia: Secondary | ICD-10-CM | POA: Diagnosis not present

## 2017-11-05 DIAGNOSIS — C61 Malignant neoplasm of prostate: Secondary | ICD-10-CM | POA: Diagnosis not present

## 2017-11-06 ENCOUNTER — Ambulatory Visit
Admission: RE | Admit: 2017-11-06 | Discharge: 2017-11-06 | Disposition: A | Discharge status: Home / Self Care | Payer: Medicare HMO | Source: Ambulatory Visit | Attending: Radiation Oncology | Admitting: Radiation Oncology

## 2017-11-06 DIAGNOSIS — Z510 Encounter for antineoplastic radiation therapy: Secondary | ICD-10-CM | POA: Diagnosis not present

## 2017-11-06 DIAGNOSIS — K219 Gastro-esophageal reflux disease without esophagitis: Secondary | ICD-10-CM | POA: Diagnosis not present

## 2017-11-06 DIAGNOSIS — R350 Frequency of micturition: Secondary | ICD-10-CM | POA: Diagnosis not present

## 2017-11-06 DIAGNOSIS — I10 Essential (primary) hypertension: Secondary | ICD-10-CM | POA: Diagnosis not present

## 2017-11-06 DIAGNOSIS — N529 Male erectile dysfunction, unspecified: Secondary | ICD-10-CM | POA: Diagnosis not present

## 2017-11-06 DIAGNOSIS — C61 Malignant neoplasm of prostate: Secondary | ICD-10-CM | POA: Diagnosis not present

## 2017-11-06 DIAGNOSIS — Z8249 Family history of ischemic heart disease and other diseases of the circulatory system: Secondary | ICD-10-CM | POA: Diagnosis not present

## 2017-11-06 DIAGNOSIS — E785 Hyperlipidemia, unspecified: Secondary | ICD-10-CM | POA: Diagnosis not present

## 2017-11-06 DIAGNOSIS — Z87891 Personal history of nicotine dependence: Secondary | ICD-10-CM | POA: Diagnosis not present

## 2017-11-06 DIAGNOSIS — R351 Nocturia: Secondary | ICD-10-CM | POA: Diagnosis not present

## 2017-11-07 ENCOUNTER — Other Ambulatory Visit: Payer: Self-pay | Admitting: Radiation Oncology

## 2017-11-07 ENCOUNTER — Ambulatory Visit
Admission: RE | Admit: 2017-11-07 | Discharge: 2017-11-07 | Disposition: A | Discharge status: Home / Self Care | Payer: Medicare HMO | Source: Ambulatory Visit | Attending: Radiation Oncology | Admitting: Radiation Oncology

## 2017-11-07 DIAGNOSIS — Z510 Encounter for antineoplastic radiation therapy: Secondary | ICD-10-CM | POA: Diagnosis not present

## 2017-11-07 DIAGNOSIS — R351 Nocturia: Secondary | ICD-10-CM | POA: Diagnosis not present

## 2017-11-07 DIAGNOSIS — N529 Male erectile dysfunction, unspecified: Secondary | ICD-10-CM | POA: Diagnosis not present

## 2017-11-07 DIAGNOSIS — I10 Essential (primary) hypertension: Secondary | ICD-10-CM | POA: Diagnosis not present

## 2017-11-07 DIAGNOSIS — C61 Malignant neoplasm of prostate: Secondary | ICD-10-CM | POA: Diagnosis not present

## 2017-11-07 DIAGNOSIS — E785 Hyperlipidemia, unspecified: Secondary | ICD-10-CM | POA: Diagnosis not present

## 2017-11-07 DIAGNOSIS — R350 Frequency of micturition: Secondary | ICD-10-CM | POA: Diagnosis not present

## 2017-11-07 DIAGNOSIS — Z8249 Family history of ischemic heart disease and other diseases of the circulatory system: Secondary | ICD-10-CM | POA: Diagnosis not present

## 2017-11-07 DIAGNOSIS — Z87891 Personal history of nicotine dependence: Secondary | ICD-10-CM | POA: Diagnosis not present

## 2017-11-07 DIAGNOSIS — K219 Gastro-esophageal reflux disease without esophagitis: Secondary | ICD-10-CM | POA: Diagnosis not present

## 2017-11-07 MED ORDER — TAMSULOSIN HCL 0.4 MG PO CAPS: 0.4000 mg | ORAL_CAPSULE | Freq: Every day | ORAL | 5 refills | Status: DC

## 2017-11-08 ENCOUNTER — Ambulatory Visit
Admission: RE | Admit: 2017-11-08 | Discharge: 2017-11-08 | Disposition: A | Discharge status: Home / Self Care | Payer: Medicare HMO | Source: Ambulatory Visit | Attending: Radiation Oncology | Admitting: Radiation Oncology

## 2017-11-08 DIAGNOSIS — R351 Nocturia: Secondary | ICD-10-CM | POA: Diagnosis not present

## 2017-11-08 DIAGNOSIS — Z510 Encounter for antineoplastic radiation therapy: Secondary | ICD-10-CM | POA: Diagnosis not present

## 2017-11-08 DIAGNOSIS — Z8249 Family history of ischemic heart disease and other diseases of the circulatory system: Secondary | ICD-10-CM | POA: Diagnosis not present

## 2017-11-08 DIAGNOSIS — E785 Hyperlipidemia, unspecified: Secondary | ICD-10-CM | POA: Diagnosis not present

## 2017-11-08 DIAGNOSIS — I10 Essential (primary) hypertension: Secondary | ICD-10-CM | POA: Diagnosis not present

## 2017-11-08 DIAGNOSIS — Z87891 Personal history of nicotine dependence: Secondary | ICD-10-CM | POA: Diagnosis not present

## 2017-11-08 DIAGNOSIS — K219 Gastro-esophageal reflux disease without esophagitis: Secondary | ICD-10-CM | POA: Diagnosis not present

## 2017-11-08 DIAGNOSIS — C61 Malignant neoplasm of prostate: Secondary | ICD-10-CM | POA: Diagnosis not present

## 2017-11-08 DIAGNOSIS — R350 Frequency of micturition: Secondary | ICD-10-CM | POA: Diagnosis not present

## 2017-11-08 DIAGNOSIS — N529 Male erectile dysfunction, unspecified: Secondary | ICD-10-CM | POA: Diagnosis not present

## 2017-11-11 ENCOUNTER — Ambulatory Visit
Admission: RE | Admit: 2017-11-11 | Discharge: 2017-11-11 | Disposition: A | Discharge status: Home / Self Care | Payer: Medicare HMO | Source: Ambulatory Visit | Attending: Radiation Oncology | Admitting: Radiation Oncology

## 2017-11-11 DIAGNOSIS — K219 Gastro-esophageal reflux disease without esophagitis: Secondary | ICD-10-CM | POA: Diagnosis not present

## 2017-11-11 DIAGNOSIS — R351 Nocturia: Secondary | ICD-10-CM | POA: Diagnosis not present

## 2017-11-11 DIAGNOSIS — Z8249 Family history of ischemic heart disease and other diseases of the circulatory system: Secondary | ICD-10-CM | POA: Diagnosis not present

## 2017-11-11 DIAGNOSIS — C61 Malignant neoplasm of prostate: Secondary | ICD-10-CM | POA: Diagnosis not present

## 2017-11-11 DIAGNOSIS — N529 Male erectile dysfunction, unspecified: Secondary | ICD-10-CM | POA: Diagnosis not present

## 2017-11-11 DIAGNOSIS — Z87891 Personal history of nicotine dependence: Secondary | ICD-10-CM | POA: Diagnosis not present

## 2017-11-11 DIAGNOSIS — R350 Frequency of micturition: Secondary | ICD-10-CM | POA: Diagnosis not present

## 2017-11-11 DIAGNOSIS — E785 Hyperlipidemia, unspecified: Secondary | ICD-10-CM | POA: Diagnosis not present

## 2017-11-11 DIAGNOSIS — I10 Essential (primary) hypertension: Secondary | ICD-10-CM | POA: Diagnosis not present

## 2017-11-11 DIAGNOSIS — Z510 Encounter for antineoplastic radiation therapy: Secondary | ICD-10-CM | POA: Diagnosis not present

## 2017-11-13 ENCOUNTER — Ambulatory Visit
Admission: RE | Admit: 2017-11-13 | Discharge: 2017-11-13 | Disposition: A | Discharge status: Home / Self Care | Payer: Medicare HMO | Source: Ambulatory Visit | Attending: Radiation Oncology | Admitting: Radiation Oncology

## 2017-11-13 DIAGNOSIS — K219 Gastro-esophageal reflux disease without esophagitis: Secondary | ICD-10-CM | POA: Diagnosis not present

## 2017-11-13 DIAGNOSIS — Z87891 Personal history of nicotine dependence: Secondary | ICD-10-CM | POA: Diagnosis not present

## 2017-11-13 DIAGNOSIS — E785 Hyperlipidemia, unspecified: Secondary | ICD-10-CM | POA: Diagnosis not present

## 2017-11-13 DIAGNOSIS — Z510 Encounter for antineoplastic radiation therapy: Secondary | ICD-10-CM | POA: Diagnosis not present

## 2017-11-13 DIAGNOSIS — N529 Male erectile dysfunction, unspecified: Secondary | ICD-10-CM | POA: Diagnosis not present

## 2017-11-13 DIAGNOSIS — R351 Nocturia: Secondary | ICD-10-CM | POA: Diagnosis not present

## 2017-11-13 DIAGNOSIS — Z8249 Family history of ischemic heart disease and other diseases of the circulatory system: Secondary | ICD-10-CM | POA: Diagnosis not present

## 2017-11-13 DIAGNOSIS — I10 Essential (primary) hypertension: Secondary | ICD-10-CM | POA: Diagnosis not present

## 2017-11-13 DIAGNOSIS — C61 Malignant neoplasm of prostate: Secondary | ICD-10-CM | POA: Diagnosis not present

## 2017-11-13 DIAGNOSIS — R350 Frequency of micturition: Secondary | ICD-10-CM | POA: Diagnosis not present

## 2017-11-14 ENCOUNTER — Ambulatory Visit
Admission: RE | Admit: 2017-11-14 | Discharge: 2017-11-14 | Disposition: A | Discharge status: Home / Self Care | Payer: Medicare HMO | Source: Ambulatory Visit | Attending: Radiation Oncology | Admitting: Radiation Oncology

## 2017-11-14 DIAGNOSIS — K219 Gastro-esophageal reflux disease without esophagitis: Secondary | ICD-10-CM | POA: Diagnosis not present

## 2017-11-14 DIAGNOSIS — R351 Nocturia: Secondary | ICD-10-CM | POA: Diagnosis not present

## 2017-11-14 DIAGNOSIS — Z8249 Family history of ischemic heart disease and other diseases of the circulatory system: Secondary | ICD-10-CM | POA: Diagnosis not present

## 2017-11-14 DIAGNOSIS — E785 Hyperlipidemia, unspecified: Secondary | ICD-10-CM | POA: Diagnosis not present

## 2017-11-14 DIAGNOSIS — Z87891 Personal history of nicotine dependence: Secondary | ICD-10-CM | POA: Diagnosis not present

## 2017-11-14 DIAGNOSIS — R350 Frequency of micturition: Secondary | ICD-10-CM | POA: Diagnosis not present

## 2017-11-14 DIAGNOSIS — Z510 Encounter for antineoplastic radiation therapy: Secondary | ICD-10-CM | POA: Diagnosis not present

## 2017-11-14 DIAGNOSIS — N529 Male erectile dysfunction, unspecified: Secondary | ICD-10-CM | POA: Diagnosis not present

## 2017-11-14 DIAGNOSIS — C61 Malignant neoplasm of prostate: Secondary | ICD-10-CM | POA: Diagnosis not present

## 2017-11-14 DIAGNOSIS — I10 Essential (primary) hypertension: Secondary | ICD-10-CM | POA: Diagnosis not present

## 2017-11-15 ENCOUNTER — Ambulatory Visit
Admission: RE | Admit: 2017-11-15 | Discharge: 2017-11-15 | Disposition: A | Discharge status: Home / Self Care | Payer: Medicare HMO | Source: Ambulatory Visit | Attending: Radiation Oncology | Admitting: Radiation Oncology

## 2017-11-15 DIAGNOSIS — N529 Male erectile dysfunction, unspecified: Secondary | ICD-10-CM | POA: Diagnosis not present

## 2017-11-15 DIAGNOSIS — R350 Frequency of micturition: Secondary | ICD-10-CM | POA: Diagnosis not present

## 2017-11-15 DIAGNOSIS — R351 Nocturia: Secondary | ICD-10-CM | POA: Diagnosis not present

## 2017-11-15 DIAGNOSIS — K219 Gastro-esophageal reflux disease without esophagitis: Secondary | ICD-10-CM | POA: Diagnosis not present

## 2017-11-15 DIAGNOSIS — Z87891 Personal history of nicotine dependence: Secondary | ICD-10-CM | POA: Diagnosis not present

## 2017-11-15 DIAGNOSIS — I10 Essential (primary) hypertension: Secondary | ICD-10-CM | POA: Diagnosis not present

## 2017-11-15 DIAGNOSIS — Z8249 Family history of ischemic heart disease and other diseases of the circulatory system: Secondary | ICD-10-CM | POA: Diagnosis not present

## 2017-11-15 DIAGNOSIS — C61 Malignant neoplasm of prostate: Secondary | ICD-10-CM | POA: Diagnosis not present

## 2017-11-15 DIAGNOSIS — E785 Hyperlipidemia, unspecified: Secondary | ICD-10-CM | POA: Diagnosis not present

## 2017-11-15 DIAGNOSIS — Z510 Encounter for antineoplastic radiation therapy: Secondary | ICD-10-CM | POA: Diagnosis not present

## 2017-11-18 ENCOUNTER — Ambulatory Visit
Admission: RE | Admit: 2017-11-18 | Discharge: 2017-11-18 | Disposition: A | Discharge status: Home / Self Care | Payer: Medicare HMO | Source: Ambulatory Visit | Attending: Radiation Oncology | Admitting: Radiation Oncology

## 2017-11-18 DIAGNOSIS — R3915 Urgency of urination: Secondary | ICD-10-CM | POA: Insufficient documentation

## 2017-11-18 DIAGNOSIS — C61 Malignant neoplasm of prostate: Secondary | ICD-10-CM | POA: Diagnosis not present

## 2017-11-18 DIAGNOSIS — Z510 Encounter for antineoplastic radiation therapy: Secondary | ICD-10-CM | POA: Insufficient documentation

## 2017-11-18 DIAGNOSIS — Z79899 Other long term (current) drug therapy: Secondary | ICD-10-CM | POA: Insufficient documentation

## 2017-11-18 DIAGNOSIS — R5383 Other fatigue: Secondary | ICD-10-CM | POA: Insufficient documentation

## 2017-11-18 DIAGNOSIS — R3911 Hesitancy of micturition: Secondary | ICD-10-CM | POA: Insufficient documentation

## 2017-11-18 DIAGNOSIS — R351 Nocturia: Secondary | ICD-10-CM | POA: Diagnosis not present

## 2017-11-20 ENCOUNTER — Ambulatory Visit
Admission: RE | Admit: 2017-11-20 | Discharge: 2017-11-20 | Disposition: A | Discharge status: Home / Self Care | Payer: Medicare HMO | Source: Ambulatory Visit | Attending: Radiation Oncology | Admitting: Radiation Oncology

## 2017-11-20 DIAGNOSIS — Z79899 Other long term (current) drug therapy: Secondary | ICD-10-CM | POA: Diagnosis not present

## 2017-11-20 DIAGNOSIS — C61 Malignant neoplasm of prostate: Secondary | ICD-10-CM | POA: Diagnosis not present

## 2017-11-20 DIAGNOSIS — Z510 Encounter for antineoplastic radiation therapy: Secondary | ICD-10-CM | POA: Diagnosis not present

## 2017-11-20 DIAGNOSIS — R5383 Other fatigue: Secondary | ICD-10-CM | POA: Diagnosis not present

## 2017-11-20 DIAGNOSIS — R3915 Urgency of urination: Secondary | ICD-10-CM | POA: Diagnosis not present

## 2017-11-20 DIAGNOSIS — R351 Nocturia: Secondary | ICD-10-CM | POA: Diagnosis not present

## 2017-11-20 DIAGNOSIS — R3911 Hesitancy of micturition: Secondary | ICD-10-CM | POA: Diagnosis not present

## 2017-11-21 ENCOUNTER — Ambulatory Visit
Admission: RE | Admit: 2017-11-21 | Discharge: 2017-11-21 | Disposition: A | Discharge status: Home / Self Care | Payer: Medicare HMO | Source: Ambulatory Visit | Attending: Radiation Oncology | Admitting: Radiation Oncology

## 2017-11-21 DIAGNOSIS — R3915 Urgency of urination: Secondary | ICD-10-CM | POA: Diagnosis not present

## 2017-11-21 DIAGNOSIS — R3911 Hesitancy of micturition: Secondary | ICD-10-CM | POA: Diagnosis not present

## 2017-11-21 DIAGNOSIS — R351 Nocturia: Secondary | ICD-10-CM | POA: Diagnosis not present

## 2017-11-21 DIAGNOSIS — C61 Malignant neoplasm of prostate: Secondary | ICD-10-CM | POA: Diagnosis not present

## 2017-11-21 DIAGNOSIS — Z79899 Other long term (current) drug therapy: Secondary | ICD-10-CM | POA: Diagnosis not present

## 2017-11-21 DIAGNOSIS — R5383 Other fatigue: Secondary | ICD-10-CM | POA: Diagnosis not present

## 2017-11-21 DIAGNOSIS — Z510 Encounter for antineoplastic radiation therapy: Secondary | ICD-10-CM | POA: Diagnosis not present

## 2017-11-22 ENCOUNTER — Ambulatory Visit
Admission: RE | Admit: 2017-11-22 | Discharge: 2017-11-22 | Disposition: A | Discharge status: Home / Self Care | Payer: Medicare HMO | Source: Ambulatory Visit | Attending: Radiation Oncology | Admitting: Radiation Oncology

## 2017-11-22 DIAGNOSIS — R3911 Hesitancy of micturition: Secondary | ICD-10-CM | POA: Diagnosis not present

## 2017-11-22 DIAGNOSIS — R5383 Other fatigue: Secondary | ICD-10-CM | POA: Diagnosis not present

## 2017-11-22 DIAGNOSIS — R3915 Urgency of urination: Secondary | ICD-10-CM | POA: Diagnosis not present

## 2017-11-22 DIAGNOSIS — R351 Nocturia: Secondary | ICD-10-CM | POA: Diagnosis not present

## 2017-11-22 DIAGNOSIS — Z79899 Other long term (current) drug therapy: Secondary | ICD-10-CM | POA: Diagnosis not present

## 2017-11-22 DIAGNOSIS — Z510 Encounter for antineoplastic radiation therapy: Secondary | ICD-10-CM | POA: Diagnosis not present

## 2017-11-22 DIAGNOSIS — C61 Malignant neoplasm of prostate: Secondary | ICD-10-CM | POA: Diagnosis not present

## 2017-11-25 ENCOUNTER — Ambulatory Visit
Admission: RE | Admit: 2017-11-25 | Discharge: 2017-11-25 | Disposition: A | Discharge status: Home / Self Care | Payer: Medicare HMO | Source: Ambulatory Visit | Attending: Radiation Oncology | Admitting: Radiation Oncology

## 2017-11-25 DIAGNOSIS — Z510 Encounter for antineoplastic radiation therapy: Secondary | ICD-10-CM | POA: Diagnosis not present

## 2017-11-25 DIAGNOSIS — R5383 Other fatigue: Secondary | ICD-10-CM | POA: Diagnosis not present

## 2017-11-25 DIAGNOSIS — C61 Malignant neoplasm of prostate: Secondary | ICD-10-CM | POA: Diagnosis not present

## 2017-11-25 DIAGNOSIS — R3915 Urgency of urination: Secondary | ICD-10-CM | POA: Diagnosis not present

## 2017-11-25 DIAGNOSIS — R351 Nocturia: Secondary | ICD-10-CM | POA: Diagnosis not present

## 2017-11-25 DIAGNOSIS — R3911 Hesitancy of micturition: Secondary | ICD-10-CM | POA: Diagnosis not present

## 2017-11-25 DIAGNOSIS — Z79899 Other long term (current) drug therapy: Secondary | ICD-10-CM | POA: Diagnosis not present

## 2017-11-26 ENCOUNTER — Ambulatory Visit
Admission: RE | Admit: 2017-11-26 | Discharge: 2017-11-26 | Disposition: A | Discharge status: Home / Self Care | Payer: Medicare HMO | Source: Ambulatory Visit | Attending: Radiation Oncology | Admitting: Radiation Oncology

## 2017-11-26 DIAGNOSIS — R3915 Urgency of urination: Secondary | ICD-10-CM | POA: Diagnosis not present

## 2017-11-26 DIAGNOSIS — R351 Nocturia: Secondary | ICD-10-CM | POA: Diagnosis not present

## 2017-11-26 DIAGNOSIS — C61 Malignant neoplasm of prostate: Secondary | ICD-10-CM | POA: Diagnosis not present

## 2017-11-26 DIAGNOSIS — Z510 Encounter for antineoplastic radiation therapy: Secondary | ICD-10-CM | POA: Diagnosis not present

## 2017-11-26 DIAGNOSIS — Z79899 Other long term (current) drug therapy: Secondary | ICD-10-CM | POA: Diagnosis not present

## 2017-11-26 DIAGNOSIS — R5383 Other fatigue: Secondary | ICD-10-CM | POA: Diagnosis not present

## 2017-11-26 DIAGNOSIS — R3911 Hesitancy of micturition: Secondary | ICD-10-CM | POA: Diagnosis not present

## 2017-11-27 ENCOUNTER — Ambulatory Visit
Admission: RE | Admit: 2017-11-27 | Discharge: 2017-11-27 | Disposition: A | Discharge status: Home / Self Care | Payer: Medicare HMO | Source: Ambulatory Visit | Attending: Radiation Oncology | Admitting: Radiation Oncology

## 2017-11-27 DIAGNOSIS — Z79899 Other long term (current) drug therapy: Secondary | ICD-10-CM | POA: Diagnosis not present

## 2017-11-27 DIAGNOSIS — R3911 Hesitancy of micturition: Secondary | ICD-10-CM | POA: Diagnosis not present

## 2017-11-27 DIAGNOSIS — R351 Nocturia: Secondary | ICD-10-CM | POA: Diagnosis not present

## 2017-11-27 DIAGNOSIS — R5383 Other fatigue: Secondary | ICD-10-CM | POA: Diagnosis not present

## 2017-11-27 DIAGNOSIS — C61 Malignant neoplasm of prostate: Secondary | ICD-10-CM | POA: Diagnosis not present

## 2017-11-27 DIAGNOSIS — Z510 Encounter for antineoplastic radiation therapy: Secondary | ICD-10-CM | POA: Diagnosis not present

## 2017-11-27 DIAGNOSIS — R3915 Urgency of urination: Secondary | ICD-10-CM | POA: Diagnosis not present

## 2017-11-28 ENCOUNTER — Ambulatory Visit
Admission: RE | Admit: 2017-11-28 | Discharge: 2017-11-28 | Disposition: A | Discharge status: Home / Self Care | Payer: Medicare HMO | Source: Ambulatory Visit | Attending: Radiation Oncology | Admitting: Radiation Oncology

## 2017-11-28 DIAGNOSIS — C61 Malignant neoplasm of prostate: Secondary | ICD-10-CM | POA: Diagnosis not present

## 2017-11-28 DIAGNOSIS — Z510 Encounter for antineoplastic radiation therapy: Secondary | ICD-10-CM | POA: Diagnosis not present

## 2017-11-28 DIAGNOSIS — Z79899 Other long term (current) drug therapy: Secondary | ICD-10-CM | POA: Diagnosis not present

## 2017-11-28 DIAGNOSIS — R3915 Urgency of urination: Secondary | ICD-10-CM | POA: Diagnosis not present

## 2017-11-28 DIAGNOSIS — R3911 Hesitancy of micturition: Secondary | ICD-10-CM | POA: Diagnosis not present

## 2017-11-28 DIAGNOSIS — R5383 Other fatigue: Secondary | ICD-10-CM | POA: Diagnosis not present

## 2017-11-28 DIAGNOSIS — R351 Nocturia: Secondary | ICD-10-CM | POA: Diagnosis not present

## 2017-11-29 ENCOUNTER — Ambulatory Visit
Admission: RE | Admit: 2017-11-29 | Discharge: 2017-11-29 | Disposition: A | Discharge status: Home / Self Care | Payer: Medicare HMO | Source: Ambulatory Visit | Attending: Radiation Oncology | Admitting: Radiation Oncology

## 2017-11-29 DIAGNOSIS — R3915 Urgency of urination: Secondary | ICD-10-CM | POA: Diagnosis not present

## 2017-11-29 DIAGNOSIS — R5383 Other fatigue: Secondary | ICD-10-CM | POA: Diagnosis not present

## 2017-11-29 DIAGNOSIS — C61 Malignant neoplasm of prostate: Secondary | ICD-10-CM | POA: Diagnosis not present

## 2017-11-29 DIAGNOSIS — R351 Nocturia: Secondary | ICD-10-CM | POA: Diagnosis not present

## 2017-11-29 DIAGNOSIS — R3911 Hesitancy of micturition: Secondary | ICD-10-CM | POA: Diagnosis not present

## 2017-11-29 DIAGNOSIS — Z510 Encounter for antineoplastic radiation therapy: Secondary | ICD-10-CM | POA: Diagnosis not present

## 2017-11-29 DIAGNOSIS — Z79899 Other long term (current) drug therapy: Secondary | ICD-10-CM | POA: Diagnosis not present

## 2017-12-02 ENCOUNTER — Ambulatory Visit
Admission: RE | Admit: 2017-12-02 | Discharge: 2017-12-02 | Disposition: A | Discharge status: Home / Self Care | Payer: Medicare HMO | Source: Ambulatory Visit | Attending: Radiation Oncology | Admitting: Radiation Oncology

## 2017-12-02 DIAGNOSIS — R3915 Urgency of urination: Secondary | ICD-10-CM | POA: Diagnosis not present

## 2017-12-02 DIAGNOSIS — R351 Nocturia: Secondary | ICD-10-CM | POA: Diagnosis not present

## 2017-12-02 DIAGNOSIS — Z510 Encounter for antineoplastic radiation therapy: Secondary | ICD-10-CM | POA: Diagnosis not present

## 2017-12-02 DIAGNOSIS — C61 Malignant neoplasm of prostate: Secondary | ICD-10-CM | POA: Diagnosis not present

## 2017-12-02 DIAGNOSIS — R3911 Hesitancy of micturition: Secondary | ICD-10-CM | POA: Diagnosis not present

## 2017-12-02 DIAGNOSIS — R5383 Other fatigue: Secondary | ICD-10-CM | POA: Diagnosis not present

## 2017-12-02 DIAGNOSIS — Z79899 Other long term (current) drug therapy: Secondary | ICD-10-CM | POA: Diagnosis not present

## 2017-12-03 ENCOUNTER — Ambulatory Visit
Admission: RE | Admit: 2017-12-03 | Discharge: 2017-12-03 | Disposition: A | Discharge status: Home / Self Care | Payer: Medicare HMO | Source: Ambulatory Visit | Attending: Radiation Oncology | Admitting: Radiation Oncology

## 2017-12-03 DIAGNOSIS — R3915 Urgency of urination: Secondary | ICD-10-CM | POA: Diagnosis not present

## 2017-12-03 DIAGNOSIS — R3911 Hesitancy of micturition: Secondary | ICD-10-CM | POA: Diagnosis not present

## 2017-12-03 DIAGNOSIS — Z5111 Encounter for antineoplastic chemotherapy: Secondary | ICD-10-CM | POA: Diagnosis not present

## 2017-12-03 DIAGNOSIS — C61 Malignant neoplasm of prostate: Secondary | ICD-10-CM | POA: Diagnosis not present

## 2017-12-03 DIAGNOSIS — R351 Nocturia: Secondary | ICD-10-CM | POA: Diagnosis not present

## 2017-12-03 DIAGNOSIS — Z79899 Other long term (current) drug therapy: Secondary | ICD-10-CM | POA: Diagnosis not present

## 2017-12-03 DIAGNOSIS — R5383 Other fatigue: Secondary | ICD-10-CM | POA: Diagnosis not present

## 2017-12-03 DIAGNOSIS — Z510 Encounter for antineoplastic radiation therapy: Secondary | ICD-10-CM | POA: Diagnosis not present

## 2017-12-04 ENCOUNTER — Ambulatory Visit
Admission: RE | Admit: 2017-12-04 | Discharge: 2017-12-04 | Disposition: A | Discharge status: Home / Self Care | Payer: Medicare HMO | Source: Ambulatory Visit | Attending: Radiation Oncology | Admitting: Radiation Oncology

## 2017-12-04 DIAGNOSIS — R351 Nocturia: Secondary | ICD-10-CM | POA: Diagnosis not present

## 2017-12-04 DIAGNOSIS — R5383 Other fatigue: Secondary | ICD-10-CM | POA: Diagnosis not present

## 2017-12-04 DIAGNOSIS — R3915 Urgency of urination: Secondary | ICD-10-CM | POA: Diagnosis not present

## 2017-12-04 DIAGNOSIS — R3911 Hesitancy of micturition: Secondary | ICD-10-CM | POA: Diagnosis not present

## 2017-12-04 DIAGNOSIS — Z79899 Other long term (current) drug therapy: Secondary | ICD-10-CM | POA: Diagnosis not present

## 2017-12-04 DIAGNOSIS — C61 Malignant neoplasm of prostate: Secondary | ICD-10-CM | POA: Diagnosis not present

## 2017-12-04 DIAGNOSIS — Z510 Encounter for antineoplastic radiation therapy: Secondary | ICD-10-CM | POA: Diagnosis not present

## 2017-12-05 ENCOUNTER — Ambulatory Visit
Admission: RE | Admit: 2017-12-05 | Discharge: 2017-12-05 | Disposition: A | Discharge status: Home / Self Care | Payer: Medicare HMO | Source: Ambulatory Visit | Attending: Radiation Oncology | Admitting: Radiation Oncology

## 2017-12-05 DIAGNOSIS — R5383 Other fatigue: Secondary | ICD-10-CM | POA: Diagnosis not present

## 2017-12-05 DIAGNOSIS — Z79899 Other long term (current) drug therapy: Secondary | ICD-10-CM | POA: Diagnosis not present

## 2017-12-05 DIAGNOSIS — R3915 Urgency of urination: Secondary | ICD-10-CM | POA: Diagnosis not present

## 2017-12-05 DIAGNOSIS — C61 Malignant neoplasm of prostate: Secondary | ICD-10-CM | POA: Diagnosis not present

## 2017-12-05 DIAGNOSIS — R351 Nocturia: Secondary | ICD-10-CM | POA: Diagnosis not present

## 2017-12-05 DIAGNOSIS — R3911 Hesitancy of micturition: Secondary | ICD-10-CM | POA: Diagnosis not present

## 2017-12-05 DIAGNOSIS — Z510 Encounter for antineoplastic radiation therapy: Secondary | ICD-10-CM | POA: Diagnosis not present

## 2017-12-06 ENCOUNTER — Ambulatory Visit
Admission: RE | Admit: 2017-12-06 | Discharge: 2017-12-06 | Disposition: A | Discharge status: Home / Self Care | Payer: Medicare HMO | Source: Ambulatory Visit | Attending: Radiation Oncology | Admitting: Radiation Oncology

## 2017-12-06 DIAGNOSIS — R3915 Urgency of urination: Secondary | ICD-10-CM | POA: Diagnosis not present

## 2017-12-06 DIAGNOSIS — R5383 Other fatigue: Secondary | ICD-10-CM | POA: Diagnosis not present

## 2017-12-06 DIAGNOSIS — R3911 Hesitancy of micturition: Secondary | ICD-10-CM | POA: Diagnosis not present

## 2017-12-06 DIAGNOSIS — Z510 Encounter for antineoplastic radiation therapy: Secondary | ICD-10-CM | POA: Diagnosis not present

## 2017-12-06 DIAGNOSIS — Z79899 Other long term (current) drug therapy: Secondary | ICD-10-CM | POA: Diagnosis not present

## 2017-12-06 DIAGNOSIS — R351 Nocturia: Secondary | ICD-10-CM | POA: Diagnosis not present

## 2017-12-06 DIAGNOSIS — C61 Malignant neoplasm of prostate: Secondary | ICD-10-CM | POA: Diagnosis not present

## 2017-12-09 ENCOUNTER — Ambulatory Visit
Admission: RE | Admit: 2017-12-09 | Discharge: 2017-12-09 | Disposition: A | Discharge status: Home / Self Care | Payer: Medicare HMO | Source: Ambulatory Visit | Attending: Radiation Oncology | Admitting: Radiation Oncology

## 2017-12-09 DIAGNOSIS — Z79899 Other long term (current) drug therapy: Secondary | ICD-10-CM | POA: Diagnosis not present

## 2017-12-09 DIAGNOSIS — R5383 Other fatigue: Secondary | ICD-10-CM | POA: Diagnosis not present

## 2017-12-09 DIAGNOSIS — R351 Nocturia: Secondary | ICD-10-CM | POA: Diagnosis not present

## 2017-12-09 DIAGNOSIS — Z510 Encounter for antineoplastic radiation therapy: Secondary | ICD-10-CM | POA: Diagnosis not present

## 2017-12-09 DIAGNOSIS — R3911 Hesitancy of micturition: Secondary | ICD-10-CM | POA: Diagnosis not present

## 2017-12-09 DIAGNOSIS — R3915 Urgency of urination: Secondary | ICD-10-CM | POA: Diagnosis not present

## 2017-12-09 DIAGNOSIS — C61 Malignant neoplasm of prostate: Secondary | ICD-10-CM | POA: Diagnosis not present

## 2017-12-10 ENCOUNTER — Ambulatory Visit
Admission: RE | Admit: 2017-12-10 | Discharge: 2017-12-10 | Disposition: A | Discharge status: Home / Self Care | Payer: Medicare HMO | Source: Ambulatory Visit | Attending: Radiation Oncology | Admitting: Radiation Oncology

## 2017-12-10 DIAGNOSIS — Z79899 Other long term (current) drug therapy: Secondary | ICD-10-CM | POA: Diagnosis not present

## 2017-12-10 DIAGNOSIS — R351 Nocturia: Secondary | ICD-10-CM | POA: Diagnosis not present

## 2017-12-10 DIAGNOSIS — R3911 Hesitancy of micturition: Secondary | ICD-10-CM | POA: Diagnosis not present

## 2017-12-10 DIAGNOSIS — R3915 Urgency of urination: Secondary | ICD-10-CM | POA: Diagnosis not present

## 2017-12-10 DIAGNOSIS — C61 Malignant neoplasm of prostate: Secondary | ICD-10-CM | POA: Diagnosis not present

## 2017-12-10 DIAGNOSIS — Z510 Encounter for antineoplastic radiation therapy: Secondary | ICD-10-CM | POA: Diagnosis not present

## 2017-12-10 DIAGNOSIS — R5383 Other fatigue: Secondary | ICD-10-CM | POA: Diagnosis not present

## 2017-12-11 ENCOUNTER — Ambulatory Visit
Admission: RE | Admit: 2017-12-11 | Discharge: 2017-12-11 | Disposition: A | Discharge status: Home / Self Care | Payer: Medicare HMO | Source: Ambulatory Visit | Attending: Radiation Oncology | Admitting: Radiation Oncology

## 2017-12-11 DIAGNOSIS — Z79899 Other long term (current) drug therapy: Secondary | ICD-10-CM | POA: Diagnosis not present

## 2017-12-11 DIAGNOSIS — C61 Malignant neoplasm of prostate: Secondary | ICD-10-CM | POA: Diagnosis not present

## 2017-12-11 DIAGNOSIS — R351 Nocturia: Secondary | ICD-10-CM | POA: Diagnosis not present

## 2017-12-11 DIAGNOSIS — R3911 Hesitancy of micturition: Secondary | ICD-10-CM | POA: Diagnosis not present

## 2017-12-11 DIAGNOSIS — R3915 Urgency of urination: Secondary | ICD-10-CM | POA: Diagnosis not present

## 2017-12-11 DIAGNOSIS — R5383 Other fatigue: Secondary | ICD-10-CM | POA: Diagnosis not present

## 2017-12-11 DIAGNOSIS — Z510 Encounter for antineoplastic radiation therapy: Secondary | ICD-10-CM | POA: Diagnosis not present

## 2017-12-12 ENCOUNTER — Ambulatory Visit
Admission: RE | Admit: 2017-12-12 | Discharge: 2017-12-12 | Disposition: A | Discharge status: Home / Self Care | Payer: Medicare HMO | Source: Ambulatory Visit | Attending: Radiation Oncology | Admitting: Radiation Oncology

## 2017-12-12 DIAGNOSIS — C61 Malignant neoplasm of prostate: Secondary | ICD-10-CM | POA: Diagnosis not present

## 2017-12-12 DIAGNOSIS — R3915 Urgency of urination: Secondary | ICD-10-CM | POA: Diagnosis not present

## 2017-12-12 DIAGNOSIS — R3911 Hesitancy of micturition: Secondary | ICD-10-CM | POA: Diagnosis not present

## 2017-12-12 DIAGNOSIS — Z79899 Other long term (current) drug therapy: Secondary | ICD-10-CM | POA: Diagnosis not present

## 2017-12-12 DIAGNOSIS — R351 Nocturia: Secondary | ICD-10-CM | POA: Diagnosis not present

## 2017-12-12 DIAGNOSIS — R5383 Other fatigue: Secondary | ICD-10-CM | POA: Diagnosis not present

## 2017-12-12 DIAGNOSIS — Z510 Encounter for antineoplastic radiation therapy: Secondary | ICD-10-CM | POA: Diagnosis not present

## 2017-12-13 ENCOUNTER — Ambulatory Visit
Admission: RE | Admit: 2017-12-13 | Discharge: 2017-12-13 | Disposition: A | Discharge status: Home / Self Care | Payer: Medicare HMO | Source: Ambulatory Visit | Attending: Radiation Oncology | Admitting: Radiation Oncology

## 2017-12-13 DIAGNOSIS — R3911 Hesitancy of micturition: Secondary | ICD-10-CM | POA: Diagnosis not present

## 2017-12-13 DIAGNOSIS — C61 Malignant neoplasm of prostate: Secondary | ICD-10-CM | POA: Diagnosis not present

## 2017-12-13 DIAGNOSIS — R5383 Other fatigue: Secondary | ICD-10-CM | POA: Diagnosis not present

## 2017-12-13 DIAGNOSIS — Z79899 Other long term (current) drug therapy: Secondary | ICD-10-CM | POA: Diagnosis not present

## 2017-12-13 DIAGNOSIS — Z510 Encounter for antineoplastic radiation therapy: Secondary | ICD-10-CM | POA: Diagnosis not present

## 2017-12-13 DIAGNOSIS — R351 Nocturia: Secondary | ICD-10-CM | POA: Diagnosis not present

## 2017-12-13 DIAGNOSIS — R3915 Urgency of urination: Secondary | ICD-10-CM | POA: Diagnosis not present

## 2017-12-16 ENCOUNTER — Ambulatory Visit
Admission: RE | Admit: 2017-12-16 | Discharge: 2017-12-16 | Disposition: A | Discharge status: Home / Self Care | Payer: Medicare HMO | Source: Ambulatory Visit | Attending: Radiation Oncology | Admitting: Radiation Oncology

## 2017-12-16 DIAGNOSIS — R5383 Other fatigue: Secondary | ICD-10-CM | POA: Diagnosis not present

## 2017-12-16 DIAGNOSIS — R3915 Urgency of urination: Secondary | ICD-10-CM | POA: Diagnosis not present

## 2017-12-16 DIAGNOSIS — Z510 Encounter for antineoplastic radiation therapy: Secondary | ICD-10-CM | POA: Diagnosis not present

## 2017-12-16 DIAGNOSIS — R351 Nocturia: Secondary | ICD-10-CM | POA: Diagnosis not present

## 2017-12-16 DIAGNOSIS — R3911 Hesitancy of micturition: Secondary | ICD-10-CM | POA: Diagnosis not present

## 2017-12-16 DIAGNOSIS — Z79899 Other long term (current) drug therapy: Secondary | ICD-10-CM | POA: Diagnosis not present

## 2017-12-16 DIAGNOSIS — C61 Malignant neoplasm of prostate: Secondary | ICD-10-CM | POA: Diagnosis not present

## 2017-12-17 ENCOUNTER — Ambulatory Visit
Admission: RE | Admit: 2017-12-17 | Discharge: 2017-12-17 | Disposition: A | Discharge status: Home / Self Care | Payer: Medicare HMO | Source: Ambulatory Visit | Attending: Radiation Oncology | Admitting: Radiation Oncology

## 2017-12-17 DIAGNOSIS — C61 Malignant neoplasm of prostate: Secondary | ICD-10-CM | POA: Diagnosis not present

## 2017-12-17 DIAGNOSIS — R5383 Other fatigue: Secondary | ICD-10-CM | POA: Diagnosis not present

## 2017-12-17 DIAGNOSIS — R3915 Urgency of urination: Secondary | ICD-10-CM | POA: Diagnosis not present

## 2017-12-17 DIAGNOSIS — R351 Nocturia: Secondary | ICD-10-CM | POA: Diagnosis not present

## 2017-12-17 DIAGNOSIS — Z79899 Other long term (current) drug therapy: Secondary | ICD-10-CM | POA: Diagnosis not present

## 2017-12-17 DIAGNOSIS — Z510 Encounter for antineoplastic radiation therapy: Secondary | ICD-10-CM | POA: Diagnosis not present

## 2017-12-17 DIAGNOSIS — R3911 Hesitancy of micturition: Secondary | ICD-10-CM | POA: Diagnosis not present

## 2017-12-18 ENCOUNTER — Ambulatory Visit
Admission: RE | Admit: 2017-12-18 | Discharge: 2017-12-18 | Disposition: A | Discharge status: Home / Self Care | Payer: Medicare HMO | Source: Ambulatory Visit | Attending: Radiation Oncology | Admitting: Radiation Oncology

## 2017-12-18 DIAGNOSIS — R3911 Hesitancy of micturition: Secondary | ICD-10-CM | POA: Diagnosis not present

## 2017-12-18 DIAGNOSIS — R351 Nocturia: Secondary | ICD-10-CM | POA: Diagnosis not present

## 2017-12-18 DIAGNOSIS — R5383 Other fatigue: Secondary | ICD-10-CM | POA: Diagnosis not present

## 2017-12-18 DIAGNOSIS — Z79899 Other long term (current) drug therapy: Secondary | ICD-10-CM | POA: Diagnosis not present

## 2017-12-18 DIAGNOSIS — Z510 Encounter for antineoplastic radiation therapy: Secondary | ICD-10-CM | POA: Diagnosis not present

## 2017-12-18 DIAGNOSIS — C61 Malignant neoplasm of prostate: Secondary | ICD-10-CM | POA: Diagnosis not present

## 2017-12-18 DIAGNOSIS — R3915 Urgency of urination: Secondary | ICD-10-CM | POA: Diagnosis not present

## 2017-12-19 ENCOUNTER — Ambulatory Visit
Admission: RE | Admit: 2017-12-19 | Discharge: 2017-12-19 | Disposition: A | Discharge status: Home / Self Care | Payer: Medicare HMO | Source: Ambulatory Visit | Attending: Radiation Oncology | Admitting: Radiation Oncology

## 2017-12-19 DIAGNOSIS — R351 Nocturia: Secondary | ICD-10-CM | POA: Diagnosis not present

## 2017-12-19 DIAGNOSIS — C61 Malignant neoplasm of prostate: Secondary | ICD-10-CM | POA: Diagnosis not present

## 2017-12-19 DIAGNOSIS — Z510 Encounter for antineoplastic radiation therapy: Secondary | ICD-10-CM | POA: Diagnosis not present

## 2017-12-19 DIAGNOSIS — R3911 Hesitancy of micturition: Secondary | ICD-10-CM | POA: Diagnosis not present

## 2017-12-19 DIAGNOSIS — R3915 Urgency of urination: Secondary | ICD-10-CM | POA: Diagnosis not present

## 2017-12-19 DIAGNOSIS — Z79899 Other long term (current) drug therapy: Secondary | ICD-10-CM | POA: Diagnosis not present

## 2017-12-19 DIAGNOSIS — R5383 Other fatigue: Secondary | ICD-10-CM | POA: Diagnosis not present

## 2017-12-20 ENCOUNTER — Ambulatory Visit
Admission: RE | Admit: 2017-12-20 | Discharge: 2017-12-20 | Disposition: A | Discharge status: Home / Self Care | Payer: Medicare HMO | Source: Ambulatory Visit | Attending: Radiation Oncology | Admitting: Radiation Oncology

## 2017-12-20 DIAGNOSIS — R351 Nocturia: Secondary | ICD-10-CM | POA: Diagnosis not present

## 2017-12-20 DIAGNOSIS — C61 Malignant neoplasm of prostate: Secondary | ICD-10-CM | POA: Diagnosis not present

## 2017-12-20 DIAGNOSIS — R3915 Urgency of urination: Secondary | ICD-10-CM | POA: Diagnosis not present

## 2017-12-20 DIAGNOSIS — R3911 Hesitancy of micturition: Secondary | ICD-10-CM | POA: Diagnosis not present

## 2017-12-20 DIAGNOSIS — Z79899 Other long term (current) drug therapy: Secondary | ICD-10-CM | POA: Diagnosis not present

## 2017-12-20 DIAGNOSIS — R5383 Other fatigue: Secondary | ICD-10-CM | POA: Diagnosis not present

## 2017-12-20 DIAGNOSIS — Z510 Encounter for antineoplastic radiation therapy: Secondary | ICD-10-CM | POA: Diagnosis not present

## 2017-12-23 ENCOUNTER — Ambulatory Visit
Admission: RE | Admit: 2017-12-23 | Discharge: 2017-12-23 | Disposition: A | Discharge status: Home / Self Care | Payer: Medicare HMO | Source: Ambulatory Visit | Attending: Radiation Oncology | Admitting: Radiation Oncology

## 2017-12-23 DIAGNOSIS — Z79899 Other long term (current) drug therapy: Secondary | ICD-10-CM | POA: Diagnosis not present

## 2017-12-23 DIAGNOSIS — C61 Malignant neoplasm of prostate: Secondary | ICD-10-CM | POA: Diagnosis not present

## 2017-12-23 DIAGNOSIS — R3915 Urgency of urination: Secondary | ICD-10-CM | POA: Diagnosis not present

## 2017-12-23 DIAGNOSIS — Z510 Encounter for antineoplastic radiation therapy: Secondary | ICD-10-CM | POA: Diagnosis not present

## 2017-12-23 DIAGNOSIS — R5383 Other fatigue: Secondary | ICD-10-CM | POA: Diagnosis not present

## 2017-12-23 DIAGNOSIS — R3911 Hesitancy of micturition: Secondary | ICD-10-CM | POA: Diagnosis not present

## 2017-12-23 DIAGNOSIS — R351 Nocturia: Secondary | ICD-10-CM | POA: Diagnosis not present

## 2017-12-24 ENCOUNTER — Ambulatory Visit
Admission: RE | Admit: 2017-12-24 | Discharge: 2017-12-24 | Disposition: A | Discharge status: Home / Self Care | Payer: Medicare HMO | Source: Ambulatory Visit | Attending: Radiation Oncology | Admitting: Radiation Oncology

## 2017-12-24 DIAGNOSIS — Z510 Encounter for antineoplastic radiation therapy: Secondary | ICD-10-CM | POA: Diagnosis not present

## 2017-12-24 DIAGNOSIS — C61 Malignant neoplasm of prostate: Secondary | ICD-10-CM | POA: Diagnosis not present

## 2017-12-24 DIAGNOSIS — R351 Nocturia: Secondary | ICD-10-CM | POA: Diagnosis not present

## 2017-12-24 DIAGNOSIS — Z79899 Other long term (current) drug therapy: Secondary | ICD-10-CM | POA: Diagnosis not present

## 2017-12-24 DIAGNOSIS — R5383 Other fatigue: Secondary | ICD-10-CM | POA: Diagnosis not present

## 2017-12-24 DIAGNOSIS — R3911 Hesitancy of micturition: Secondary | ICD-10-CM | POA: Diagnosis not present

## 2017-12-24 DIAGNOSIS — R3915 Urgency of urination: Secondary | ICD-10-CM | POA: Diagnosis not present

## 2017-12-25 ENCOUNTER — Ambulatory Visit
Admission: RE | Admit: 2017-12-25 | Discharge: 2017-12-25 | Disposition: A | Discharge status: Home / Self Care | Payer: Medicare HMO | Source: Ambulatory Visit | Attending: Radiation Oncology | Admitting: Radiation Oncology

## 2017-12-25 ENCOUNTER — Encounter: Payer: Self-pay | Admitting: Radiation Oncology

## 2017-12-25 DIAGNOSIS — R5383 Other fatigue: Secondary | ICD-10-CM | POA: Diagnosis not present

## 2017-12-25 DIAGNOSIS — C61 Malignant neoplasm of prostate: Secondary | ICD-10-CM | POA: Diagnosis not present

## 2017-12-25 DIAGNOSIS — R3915 Urgency of urination: Secondary | ICD-10-CM | POA: Diagnosis not present

## 2017-12-25 DIAGNOSIS — Z510 Encounter for antineoplastic radiation therapy: Secondary | ICD-10-CM | POA: Diagnosis not present

## 2017-12-25 DIAGNOSIS — Z79899 Other long term (current) drug therapy: Secondary | ICD-10-CM | POA: Diagnosis not present

## 2017-12-25 DIAGNOSIS — R3911 Hesitancy of micturition: Secondary | ICD-10-CM | POA: Diagnosis not present

## 2017-12-25 DIAGNOSIS — R351 Nocturia: Secondary | ICD-10-CM | POA: Diagnosis not present

## 2017-12-25 NOTE — Progress Notes (Signed)
  Radiation Oncology         [phone] ________________________________  Name: Dan Dudley MRN: [id number]  Date: 12/25/2017  DOB: Aug 15, 1946  End of Treatment Note  Diagnosis:   72 y.o. gentleman with Stage T1cadenocarcinoma of the prostate with Gleason Score of 4+3, and PSA of9.06  Indication for treatment:  Curative, Definitive Radiotherapy       Radiation treatment dates:   10/29/2017 to 12/25/2017  Site/dose:   The prostate was treated to 78 Gy in 40 fractions of 1.95 Gy  Beams/energy:   The patient was treated with IMRT using volumetric arc therapy delivering 6 MV X-rays to clockwise and counterclockwise circumferential arcs with a 90 degree collimator offset to avoid dose scalloping.  Image guidance was performed with daily cone beam CT prior to each fraction to align to gold markers in the prostate and assure proper bladder and rectal fill volumes.  Immobilization was achieved with Body[employer] custom mold.  Narrative: The patient tolerated radiation treatment relatively well. He experienced some modest fatigue and minor urinary irritation, including moderate to weak stream, nocturia x5-6, urgency, hesitancy at night, and incomplete emptying of bladder. Symptoms were improved with Flomax which was prescribed at the end of week 2 of treatment. He denied dysuria, hematuria, or incontinence. He reported that bowel movements were soft and more frequent, approximately 4 times per day with small volume.  Plan: The patient has completed radiation treatment. He will return to radiation oncology clinic for routine followup in one month. I advised him to call or return sooner if he has any questions or concerns related to his recovery or treatment. ________________________________  Sheral Apley. Tammi Klippel, M.D.  This document serves as a record of services personally performed by Tyler Pita, MD. It was created on his behalf by Arlyce Harman, a trained medical scribe. The creation of  this record is based on the scribe's personal observations and the provider's statements to them. This document has been checked and approved by the attending provider.

## 2018-01-20 DIAGNOSIS — H524 Presbyopia: Secondary | ICD-10-CM | POA: Diagnosis not present

## 2018-01-22 ENCOUNTER — Encounter: Payer: Self-pay | Admitting: Urology

## 2018-01-22 ENCOUNTER — Ambulatory Visit
Admission: RE | Admit: 2018-01-22 | Discharge: 2018-01-22 | Disposition: A | Discharge status: Home / Self Care | Payer: Medicare HMO | Source: Ambulatory Visit | Attending: Urology | Admitting: Urology

## 2018-01-22 VITALS — BP 135/94 | HR 93 | Temp 97.7°F | Resp 20 | Ht <= 58 in | Wt 192.0 lb

## 2018-01-22 DIAGNOSIS — Z7982 Long term (current) use of aspirin: Secondary | ICD-10-CM | POA: Diagnosis not present

## 2018-01-22 DIAGNOSIS — Z923 Personal history of irradiation: Secondary | ICD-10-CM | POA: Insufficient documentation

## 2018-01-22 DIAGNOSIS — C61 Malignant neoplasm of prostate: Secondary | ICD-10-CM | POA: Diagnosis not present

## 2018-01-22 DIAGNOSIS — Z79899 Other long term (current) drug therapy: Secondary | ICD-10-CM | POA: Diagnosis not present

## 2018-01-22 MED ORDER — MIRABEGRON ER 50 MG PO TB24: 50.0000 mg | ORAL_TABLET | Freq: Every day | ORAL | 4 refills | Status: DC

## 2018-01-22 MED ORDER — MIRABEGRON ER 25 MG PO TB24: 25.0000 mg | ORAL_TABLET | Freq: Every day | ORAL | 0 refills | Status: DC

## 2018-01-22 NOTE — Addendum Note (Signed)
Encounter addended by: Malena Edman, RN on: 01/22/2018 4:42 PM  Actions taken: Charge Capture section accepted

## 2018-01-22 NOTE — Progress Notes (Signed)
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  He tolerated radiation treatment relatively well. He experienced some modest fatigue and minor urinary irritation, including moderate to weak stream, nocturia x5-6, urgency, hesitancy at night, and incomplete emptying of bladder. Symptoms were improved with Flomax which was prescribed at the end of week 2 of treatment. He denied dysuria, hematuria, or incontinence. He reported that bowel movements were soft and more frequent, approximately 4 times per day with small volume.  On review of systems, the patient states that he is doing well overall.  He continues to struggle with severe daytime and nighttime frequency but denies significant urgency.  He is taking Flomax daily after supper.  He reports occasional weak stream, most noticeable if he postpone urination for any period of time.  Otherwise, he reports a decent flow of stream and feels that he is emptying his bladder well on voiding.  He has not had any recent fevers, chills or night sweats.  He denies dysuria, gross hematuria, straining, post-void dribble or incontinence.  He denies abdominal pain, nausea, vomiting or diarrhea.  He reports a healthy appetite and is maintaining his weight.  ALLERGIES:  is allergic to lipitor [atorvastatin].  Meds: Current Outpatient Medications  Medication Sig Dispense Refill  . acetaminophen (TYLENOL) 325 MG tablet Take 650 mg by mouth every 6 (six) hours as needed.    Marland Kitchen  aspirin EC 325 MG tablet Take 325 mg by mouth as needed.    . calcium carbonate (TUMS - DOSED IN MG ELEMENTAL CALCIUM) 500 MG chewable tablet Chew 1 tablet by mouth daily as needed for heartburn.    Marland Kitchen omeprazole (PRILOSEC) 20 MG capsule Take 20 mg by mouth every morning.    . tamsulosin (FLOMAX) 0.4 MG CAPS capsule Take 1 capsule (0.4 mg total) by mouth daily after supper. 30 capsule 5  . ibuprofen (ADVIL,MOTRIN) 200 MG tablet Take 200 mg by mouth every 6 (six) hours as needed.    . mirabegron ER (MYRBETRIQ) 25 MG TB24 tablet Take 1 tablet (25 mg total) by mouth daily. 30 tablet 0  . [START ON 02/22/2018] mirabegron ER (MYRBETRIQ) 50 MG TB24 tablet Take 1 tablet (50 mg total) by mouth daily. 30 tablet 4   No current facility-administered medications for this encounter.     Physical Findings:  height is 6" (0.152 m) (abnormal) and weight is 192 lb (87.1 kg). His oral temperature is 97.7 F (36.5 C). His blood pressure is 135/94 (abnormal) and his pulse is 93. His respiration is 20 and oxygen saturation is 95%.  Pain Assessment Pain Score: 0-No pain/10 In general this is a well appearing Caucasian male in no acute distress.  He's alert and oriented x4 and appropriate throughout the examination. Cardiopulmonary assessment is negative for acute distress and he exhibits normal effort.   Lab Findings: Lab Results  Component Value Date   WBC 6.0 10/08/2017   HGB 14.7 10/08/2017   HCT 43.9 10/08/2017   MCV 91.3 10/08/2017  PLT 312 10/08/2017     Radiographic Findings: No results found.  Impression/Plan: 1. 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  He will continue to follow up with urology for ongoing PSA determinations and has an appointment scheduled with Dr. Pilar Jarvis in June 2019.  He has continued on ADT throughout his course of treatment and received his last injection approximately 2 months ago.  This will complete his  29-month course of ADT.  He understands what to expect with regards to PSA monitoring going forward. I will look forward to following his response to treatment via correspondence with urology, and would be happy to continue to participate in his care if clinically indicated.  Regarding his persistent severe urinary frequency, we will try a trial of Myrbetriq 25 mg daily.  I have sent this prescription to his pharmacy and he will begin this today.  If he notices improvement, he will continue this for 4 weeks and then increase to 50 mg daily thereafter.  If he feels that there is no need to increase the dose to 50 mg he will contact my office and I am happy to continue to prescribe the 25 mg dose until he follows up with Dr. Pilar Jarvis in June.  I talked to the patient about what to expect in the future, including his risk for erectile dysfunction and rectal bleeding. I encouraged him to call or return to the office if he has any questions regarding his previous radiation or possible radiation side effects. He was comfortable with this plan and will follow up as needed.    Nicholos Johns, PA-C

## 2018-01-29 DIAGNOSIS — R69 Illness, unspecified: Secondary | ICD-10-CM | POA: Diagnosis not present

## 2018-02-17 ENCOUNTER — Encounter: Payer: Self-pay | Admitting: Internal Medicine

## 2018-02-17 ENCOUNTER — Ambulatory Visit (INDEPENDENT_AMBULATORY_CARE_PROVIDER_SITE_OTHER): Payer: Medicare HMO | Admitting: Internal Medicine

## 2018-02-17 VITALS — BP 126/80 | HR 85 | Temp 97.7°F | Ht 72.0 in | Wt 192.1 lb

## 2018-02-17 DIAGNOSIS — E785 Hyperlipidemia, unspecified: Secondary | ICD-10-CM | POA: Diagnosis not present

## 2018-02-17 DIAGNOSIS — K2100 Gastro-esophageal reflux disease with esophagitis, without bleeding: Secondary | ICD-10-CM

## 2018-02-17 DIAGNOSIS — I10 Essential (primary) hypertension: Secondary | ICD-10-CM | POA: Diagnosis not present

## 2018-02-17 MED ORDER — ATORVASTATIN CALCIUM 20 MG PO TABS: 20.0000 mg | ORAL_TABLET | Freq: Every day | ORAL | 1 refills | Status: DC

## 2018-02-17 NOTE — Progress Notes (Signed)
Subjective:  Patient ID: Dan Dudley, male    DOB: [birth year]  Age: 72 y.o. MRN: [id number]  CC: Hyperlipidemia   HPI DAYQUAN BUYS presents for f/up - He is undergoing treatment for prostate cancer.  He is tolerating his statin well with no muscle or joint aches.  He feels well today and offers no complaints.  Outpatient Medications Prior to Visit  Medication Sig Dispense Refill  . acetaminophen (TYLENOL) 325 MG tablet Take 650 mg by mouth every 6 (six) hours as needed.    Marland Kitchen aspirin EC 325 MG tablet Take 325 mg by mouth as needed.    . calcium carbonate (TUMS - DOSED IN MG ELEMENTAL CALCIUM) 500 MG chewable tablet Chew 1 tablet by mouth daily as needed for heartburn.    Marland Kitchen ibuprofen (ADVIL,MOTRIN) 200 MG tablet Take 200 mg by mouth every 6 (six) hours as needed.    Marland Kitchen leuprolide (LUPRON DEPOT, 40-MONTH,) 11.25 MG injection 7.5 mg.     . mirabegron ER (MYRBETRIQ) 25 MG TB24 tablet Take 1 tablet (25 mg total) by mouth daily. 30 tablet 0  . [START ON 02/22/2018] mirabegron ER (MYRBETRIQ) 50 MG TB24 tablet Take 1 tablet (50 mg total) by mouth daily. 30 tablet 4  . omeprazole (PRILOSEC) 20 MG capsule Take 20 mg by mouth every morning.    . tamsulosin (FLOMAX) 0.4 MG CAPS capsule Take 1 capsule (0.4 mg total) by mouth daily after supper. 30 capsule 5   No facility-administered medications prior to visit.     ROS Review of Systems  Constitutional: Negative for diaphoresis and fatigue.  HENT: Negative.  Negative for trouble swallowing.   Eyes: Negative for visual disturbance.  Respiratory: Negative.  Negative for cough, chest tightness and shortness of breath.   Cardiovascular: Negative for chest pain, palpitations and leg swelling.  Gastrointestinal: Negative for abdominal pain, diarrhea, nausea and vomiting.  Genitourinary: Negative.  Negative for difficulty urinating.  Musculoskeletal: Negative for arthralgias, back pain, myalgias and neck pain.  Skin: Negative.   Negative for color change.  Neurological: Negative.  Negative for dizziness, weakness and light-headedness.  Hematological: Negative for adenopathy. Does not bruise/bleed easily.  Psychiatric/Behavioral: Negative.   All other systems reviewed and are negative.   Objective:  BP 126/80 (BP Location: Left Arm, Patient Position: Sitting, Cuff Size: Normal)   Pulse 85   Temp 97.7 F (36.5 C) (Oral)   Ht 6' (1.829 m)   Wt 192 lb 1.3 oz (87.1 kg)   SpO2 96%   BMI 26.05 kg/m   BP Readings from Last 3 Encounters:  02/17/18 126/80  01/22/18 (!) 135/94  10/16/17 139/81    Wt Readings from Last 3 Encounters:  02/17/18 192 lb 1.3 oz (87.1 kg)  01/22/18 192 lb (87.1 kg)  10/16/17 187 lb 8 oz (85 kg)    Physical Exam  Constitutional: He is oriented to person, place, and time. No distress.  HENT:  Mouth/Throat: Oropharynx is clear and moist. No oropharyngeal exudate.  Eyes: Conjunctivae are normal. Left eye exhibits no discharge. No scleral icterus.  Neck: Normal range of motion. Neck supple. No JVD present. No thyromegaly present.  Cardiovascular: Normal rate, regular rhythm and normal heart sounds. Exam reveals no gallop.  No murmur heard. Pulmonary/Chest: Effort normal and breath sounds normal. No respiratory distress. He has no wheezes. He has no rales.  Abdominal: Soft. Bowel sounds are normal. He exhibits no distension and no mass. There is no tenderness. There is no guarding.  Musculoskeletal: Normal range of motion. He exhibits no edema, tenderness or deformity.  Lymphadenopathy:    He has no cervical adenopathy.  Neurological: He is alert and oriented to person, place, and time.  Skin: Skin is warm and dry. No rash noted. He is not diaphoretic. No erythema. No pallor.  Vitals reviewed.   Lab Results  Component Value Date   WBC 6.0 10/08/2017   HGB 14.7 10/08/2017   HCT 43.9 10/08/2017   PLT 312 10/08/2017   GLUCOSE 98 10/08/2017   CHOL 210 (H) 05/02/2017   TRIG 61.0  05/02/2017   HDL 63.40 05/02/2017   LDLDIRECT 134.3 03/27/2010   LDLCALC 135 (H) 05/02/2017   ALT 29 10/08/2017   AST 25 10/08/2017   NA 139 10/08/2017   K 3.8 10/08/2017   CL 106 10/08/2017   CREATININE 0.81 10/08/2017   BUN 19 10/08/2017   CO2 25 10/08/2017   TSH 1.05 05/02/2017   PSA 9.03 (H) 05/02/2017   INR 1.00 10/08/2017    No results found.  Assessment & Plan:   Sai was seen today for hyperlipidemia.  Diagnoses and all orders for this visit:  Essential hypertension- His blood pressure is well controlled.  Hyperlipidemia with target LDL less than 130- He has achieved his LDL goal and is doing well on the statin. -     atorvastatin (LIPITOR) 20 MG tablet; Take 1 tablet (20 mg total) by mouth daily.  Gastroesophageal reflux disease with esophagitis- His symptoms are well controlled with the PPI.   I am having Gerome Apley maintain his calcium carbonate, aspirin EC, ibuprofen, omeprazole, tamsulosin, acetaminophen, mirabegron ER, mirabegron ER, leuprolide, and atorvastatin.  Meds ordered this encounter  Medications  . atorvastatin (LIPITOR) 20 MG tablet    Sig: Take 1 tablet (20 mg total) by mouth daily.    Dispense:  90 tablet    Refill:  1     Follow-up: Return in about 6 months (around 08/19/2018).  Scarlette Calico, MD

## 2018-02-17 NOTE — Patient Instructions (Signed)

## 2018-04-22 DIAGNOSIS — C61 Malignant neoplasm of prostate: Secondary | ICD-10-CM | POA: Diagnosis not present

## 2018-04-22 LAB — PSA: PSA: 0.045

## 2018-05-05 ENCOUNTER — Telehealth: Payer: Self-pay | Admitting: Emergency Medicine

## 2018-05-05 NOTE — Telephone Encounter (Signed)
Called patient to schedule AWV. Patient will call patient to schedule appointment.

## 2018-05-06 DIAGNOSIS — R351 Nocturia: Secondary | ICD-10-CM | POA: Diagnosis not present

## 2018-05-06 DIAGNOSIS — R3915 Urgency of urination: Secondary | ICD-10-CM | POA: Diagnosis not present

## 2018-05-06 DIAGNOSIS — C61 Malignant neoplasm of prostate: Secondary | ICD-10-CM | POA: Diagnosis not present

## 2018-05-06 DIAGNOSIS — R350 Frequency of micturition: Secondary | ICD-10-CM | POA: Diagnosis not present

## 2018-06-24 DIAGNOSIS — R69 Illness, unspecified: Secondary | ICD-10-CM | POA: Diagnosis not present

## 2018-06-30 ENCOUNTER — Ambulatory Visit (INDEPENDENT_AMBULATORY_CARE_PROVIDER_SITE_OTHER): Payer: Medicare HMO | Admitting: *Deleted

## 2018-06-30 VITALS — BP 138/82 | HR 62 | Resp 18 | Ht 72.0 in | Wt 189.0 lb

## 2018-06-30 DIAGNOSIS — Z0000 Encounter for general adult medical examination without abnormal findings: Secondary | ICD-10-CM | POA: Diagnosis not present

## 2018-06-30 NOTE — Progress Notes (Addendum)
Subjective:   Dan Dudley is a 72 y.o. male who presents for Medicare Annual/Subsequent preventive examination.  Review of Systems:  No ROS.  Medicare Wellness Visit. Additional risk factors are reflected in the social history.     Sleep patterns: gets up 3-4 times nightly to void and sleeps 5-6 hours nightly. Patient reports insomnia issues, discussed recommended sleep tips.    Home Safety/Smoke Alarms: Feels safe in home. Smoke alarms in place.  Living environment; residence and Firearm Safety: 1-story house/ trailer, no firearms. Lives with wife, no needs for DME, good support system   PSA-  Lab Results  Component Value Date   PSA 0.045 04/22/2018   PSA 9.03 (H) 05/02/2017   PSA 7.23 (H) 04/23/2016       Objective:    Vitals: There were no vitals taken for this visit.  There is no height or weight on file to calculate BMI.  Advanced Directives 01/22/2018 10/16/2017 07/15/2017 05/05/2017 04/24/2016  Does Patient Have a Medical Advance Directive? Yes Yes Yes Yes Yes  Type of Paramedic of [place];Living will [place];Living will Living will;Healthcare Power of [place];Living will [place];Living will  Does patient want to make changes to medical advance directive? - - No - Patient declined - No - Patient declined  Copy of [place] in Chart? - No - copy requested No - copy requested Yes Yes    Tobacco Social History   Tobacco Use  Smoking Status Former Smoker  . Packs/day: 0.25  . Years: 8.00  . Pack years: 2.00  . Types: Cigarettes  . Last attempt to quit: 11/19/1968  . Years since quitting: 49.6  Smokeless Tobacco Never Used     Counseling given: Not Answered  Past Medical History:  Diagnosis Date  . GERD (gastroesophageal reflux disease)   . Headache(784.0)   . Hemorrhoids    internal/ external  . Hyperlipidemia    per pt watching diet  .  Hypertension per pt thinks its white coat because at home normal bp   per pt currently no medication as trial w/ pcp ok (was taking micardis 40mg )  . Nocturia more than twice per night   . Nodular hyperplasia of prostate gland   . Prostate cancer [facility] [place]) urologist-  dr budzyn/  oncologist-  dr Tammi Klippel   dx 07-15-2017 (bx)-- Stage T1c, Gleason 4+3, PSA 9.06, vol 80.2cc-- planned radiationtherpy   Past Surgical History:  Procedure Laterality Date  . COLONOSCOPY  last one 08-15-2010  . GOLD SEED IMPLANT N/A 10/16/2017   Procedure: GOLD SEED IMPLANT;  Surgeon: Nickie Retort, MD;  Location: [facility];  Service: Urology;  Laterality: N/A;  . HEMANGIOMA EXCISION  09/25/2004   elliptical excision left lower lip  . INGUINAL HERNIA REPAIR Bilateral left 10-25-2000  dr Hassell Done  right [place]  . PROSTATE BIOPSY N/A 07/15/2017   Procedure: BIOPSY TRANSRECTAL ULTRASONIC PROSTATE (TUBP);  Surgeon: Nickie Retort, MD;  Location: [facility]  Service: Urology;  Laterality: N/A;  . SPACE OAR INSTILLATION N/A 10/16/2017   Procedure: SPACE OAR INSTILLATION;  Surgeon: Nickie Retort, MD;  Location: [facility];  Service: Urology;  Laterality: N/A;   Family History  Problem Relation Age of Onset  . COPD Unknown   . Heart disease Father        age 65, nonsmoker, heavy drinker  . Cancer Mother  unknown   Social History   Socioeconomic History  . Marital status: Married    Spouse name: Not on file  . Number of children: Not on file  . Years of education: Not on file  . Highest education level: Not on file  Occupational History  . Occupation: self employed    Fish farm manager: SELF-EMPLOYED  Social Needs  . Financial resource strain: Not on file  . Food insecurity:    Worry: Not on file    Inability: Not on file  . Transportation needs:    Medical: Not on file    Non-medical: Not on file  Tobacco Use  . Smoking status: Former  Smoker    Packs/day: 0.25    Years: 8.00    Pack years: 2.00    Types: Cigarettes    Last attempt to quit: 11/19/1968    Years since quitting: 49.6  . Smokeless tobacco: Never Used  Substance and Sexual Activity  . Alcohol use: Yes    Comment: occasional  . Drug use: No  . Sexual activity: Yes  Lifestyle  . Physical activity:    Days per week: Not on file    Minutes per session: Not on file  . Stress: Not on file  Relationships  . Social connections:    Talks on phone: Not on file    Gets together: Not on file    Attends religious service: Not on file    Active member of club or organization: Not on file    Attends meetings of clubs or organizations: Not on file    Relationship status: Not on file  Other Topics Concern  . Not on file  Social History Narrative   Married [place] 2 kids. Randall Hiss lives in [place], [place] -bachelor and Radio producer; Katharine Look in [place] with a boy and girl (21 in 2015).    Missy-toy poodle (9).       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 acetaminophen (TYLENOL) 325 MG tablet Take 650 mg by mouth every 6 (six) hours as needed.  Marland Kitchen aspirin EC 325 MG tablet Take 325 mg by mouth as needed.  Marland Kitchen atorvastatin (LIPITOR) 20 MG tablet Take 1 tablet (20 mg total) by mouth daily.  . calcium carbonate (TUMS - DOSED IN MG ELEMENTAL CALCIUM) 500 MG chewable tablet Chew 1 tablet by mouth daily as needed for heartburn.  Marland Kitchen ibuprofen (ADVIL,MOTRIN) 200 MG tablet Take 200 mg by mouth every 6 (six) hours as needed.  Marland Kitchen leuprolide (LUPRON DEPOT, 77-MONTH,) 11.25 MG injection 7.5 mg.   . mirabegron ER (MYRBETRIQ) 25 MG TB24 tablet Take 1 tablet (25 mg total) by mouth daily.  . mirabegron ER (MYRBETRIQ) 50 MG TB24 tablet Take 1 tablet (50 mg total) by mouth daily.  Marland Kitchen omeprazole (PRILOSEC) 20 MG capsule Take 20 mg by mouth every morning.  . tamsulosin (FLOMAX) 0.4  MG CAPS capsule Take 1 capsule (0.4 mg total) by mouth daily after supper.   No facility-administered encounter medications on file as of 06/30/2018.     Activities of Daily Living In your present state of health, do you have any difficulty performing the following activities: 10/16/2017 07/15/2017  Hearing? Y Y  Comment a little -  Vision? N N  Difficulty concentrating or making decisions? N N  Walking or climbing stairs? N N  Dressing or bathing? Aggie Moats  Some recent data might be hidden    Patient Care Team: Janith Lima, MD as PCP - General (Internal Medicine)   Assessment:   This is a routine wellness examination for [place]. Physical assessment deferred to PCP.   Exercise Activities and Dietary recommendations   Diet (meal preparation, eat out, water intake, caffeinated beverages, dairy products, fruits and vegetables): in general, a "healthy" diet  , well balanced. eats a variety of fruits and vegetables daily, limits salt, fat/cholesterol, sugar,carbohydrates,caffeine, drinks 6-8 glasses of water daily.     Goals   None     Fall Risk Fall Risk  01/22/2018 08/19/2017 08/19/2017 05/05/2017 04/24/2016  Falls in the past year? No No No No No    Depression Screen PHQ 2/9 Scores 01/22/2018 08/19/2017 08/19/2017 05/05/2017  PHQ - 2 Score 1 0 0 0    Cognitive Function       Ad8 score reviewed for issues:  Issues making decisions: no  Less interest in hobbies / activities: no  Repeats questions, stories (family complaining): no  Trouble using ordinary gadgets (microwave, computer, phone):no  Forgets the month or year: no  Mismanaging finances: no  Remembering appts: no  Daily problems with thinking and/or memory: no Ad8 score is= 0  Immunization History  Administered Date(s) Administered  . Influenza Split 10/04/2011, 08/19/2012  . Influenza Whole 08/09/2010  . Influenza, High Dose Seasonal PF 08/07/2016, 08/13/2017  . Influenza,inj,Quad PF,6+ Mos 08/14/2013,  08/13/2014, 08/17/2015  . Pneumococcal Conjugate-13 10/19/2013  . Pneumococcal Polysaccharide-23 08/17/2015  . Td 11/19/2001  . Tdap 09/25/2012  . Zoster 05/14/2011   Screening Tests Health Maintenance  Topic Date Due  . INFLUENZA VACCINE  06/19/2018  . COLONOSCOPY  08/15/2020  . TETANUS/TDAP  09/25/2022  . Hepatitis C Screening  Completed  . PNA vac Low Risk Adult  Completed      Plan:     Continue doing brain stimulating activities (puzzles, reading, adult coloring books, staying active) to keep memory sharp.   Continue to eat heart healthy diet (full of fruits, vegetables, whole grains, lean protein, water--limit salt, fat, and sugar intake) and increase physical activity as tolerated.  I have personally reviewed and noted the following in the patient's chart:   . Medical and social history . Use of alcohol, tobacco or illicit drugs  . Current medications and supplements . Functional ability and status . Nutritional status . Physical activity . Advanced directives . List of other physicians . Vitals . Screenings to include cognitive, depression, and falls . Referrals and appointments  In addition, I have reviewed and discussed with patient certain preventive protocols, quality metrics, and best practice recommendations. A written personalized care plan for preventive services as well as general preventive health recommendations were provided to patient.   Medical screening examination/treatment/procedure(s) were performed by non-physician practitioner and as supervising physician I was immediately available for consultation/collaboration. I agree with above. Scarlette Calico, MD   Michiel Cowboy, RN  06/30/2018

## 2018-06-30 NOTE — Patient Instructions (Addendum)
Continue doing brain stimulating activities (puzzles, reading, adult coloring books, staying active) to keep memory sharp.   Continue to eat heart healthy diet (full of fruits, vegetables, whole grains, lean protein, water--limit salt, fat, and sugar intake) and increase physical activity as tolerated.   [name] , Thank you for taking time to come for your Medicare Wellness Visit. I appreciate your ongoing commitment to your health goals. Please review the following plan we discussed and let me know if I can assist you in the future.   These are the goals we discussed: Goals    . Patient Stated     Stay as healthy and as independent as possible.       This is a list of the screening recommended for you and due dates:  Health Maintenance  Topic Date Due  . Flu Shot  06/19/2018  . Colon Cancer Screening  08/15/2020  . Tetanus Vaccine  09/25/2022  .  Hepatitis C: One time screening is recommended by Center for Disease Control  (CDC) for  adults born from [birth year] through 1965.   Completed  . Pneumonia vaccines  Completed     Health Maintenance, Male A healthy lifestyle and preventive care is important for your health and wellness. Ask your health care provider about what schedule of regular examinations is right for you. What should I know about weight and diet? Eat a Healthy Diet  Eat plenty of vegetables, fruits, whole grains, low-fat dairy products, and lean protein.  Do not eat a lot of foods high in solid fats, added sugars, or salt.  Maintain a Healthy Weight Regular exercise can help you achieve or maintain a healthy weight. You should:  Do at least 150 minutes of exercise each week. The exercise should increase your heart rate and make you sweat (moderate-intensity exercise).  Do strength-training exercises at least twice a week.  Watch Your Levels of Cholesterol and Blood Lipids  Have your blood tested for lipids and cholesterol every 5 years starting at 74 years of  age. If you are at high risk for heart disease, you should start having your blood tested when you are 74 years old. You may need to have your cholesterol levels checked more often if: ? Your lipid or cholesterol levels are high. ? You are older than 74 years of age. ? You are at high risk for heart disease.  What should I know about cancer screening? Many types of cancers can be detected early and may often be prevented. Lung Cancer  You should be screened every year for lung cancer if: ? You are a current smoker who has smoked for at least 30 years. ? You are a former smoker who has quit within the past 15 years.  Talk to your health care provider about your screening options, when you should start screening, and how often you should be screened.  Colorectal Cancer  Routine colorectal cancer screening usually begins at 73 years of age and should be repeated every 5-10 years until you are 74 years old. You may need to be screened more often if early forms of precancerous polyps or small growths are found. Your health care provider may recommend screening at an earlier age if you have risk factors for colon cancer.  Your health care provider may recommend using home test kits to check for hidden blood in the stool.  A small camera at the end of a tube can be used to examine your colon (sigmoidoscopy or colonoscopy).  This checks for the earliest forms of colorectal cancer.  Prostate and Testicular Cancer  Depending on your age and overall health, your health care provider may do certain tests to screen for prostate and testicular cancer.  Talk to your health care provider about any symptoms or concerns you have about testicular or prostate cancer.  Skin Cancer  Check your skin from head to toe regularly.  Tell your health care provider about any new moles or changes in moles, especially if: ? There is a change in a mole's size, shape, or color. ? You have a mole that is larger than  a pencil eraser.  Always use sunscreen. Apply sunscreen liberally and repeat throughout the day.  Protect yourself by wearing long sleeves, pants, a wide-brimmed hat, and sunglasses when outside.  What should I know about heart disease, diabetes, and high blood pressure?  If you are 96-85 years of age, have your blood pressure checked every 3-5 years. If you are 67 years of age or older, have your blood pressure checked every year. You should have your blood pressure measured twice-once when you are at a hospital or clinic, and once when you are not at a hospital or clinic. Record the average of the two measurements. To check your blood pressure when you are not at a hospital or clinic, you can use: ? An automated blood pressure machine at a pharmacy. ? A home blood pressure monitor.  Talk to your health care provider about your target blood pressure.  If you are between 60-56 years old, ask your health care provider if you should take aspirin to prevent heart disease.  Have regular diabetes screenings by checking your fasting blood sugar level. ? If you are at a normal weight and have a low risk for diabetes, have this test once every three years after the age of 42. ? If you are overweight and have a high risk for diabetes, consider being tested at a younger age or more often.  A one-time screening for abdominal aortic aneurysm (AAA) by ultrasound is recommended for men aged 52-75 years who are current or former smokers. What should I know about preventing infection? Hepatitis B If you have a higher risk for hepatitis B, you should be screened for this virus. Talk with your health care provider to find out if you are at risk for hepatitis B infection. Hepatitis C Blood testing is recommended for:  Everyone born from [birth year] through 1967.  Anyone with known risk factors for hepatitis C.  Sexually Transmitted Diseases (STDs)  You should be screened each year for STDs including gonorrhea  and chlamydia if: ? You are sexually active and are younger than 74 years of age. ? You are older than 74 years of age and your health care provider tells you that you are at risk for this type of infection. ? Your sexual activity has changed since you were last screened and you are at an increased risk for chlamydia or gonorrhea. Ask your health care provider if you are at risk.  Talk with your health care provider about whether you are at high risk of being infected with HIV. Your health care provider may recommend a prescription medicine to help prevent HIV infection.  What else can I do?  Schedule regular health, dental, and eye exams.  Stay current with your vaccines (immunizations).  Do not use any tobacco products, such as cigarettes, chewing tobacco, and e-cigarettes. If you need help quitting, ask your health care  provider.  Limit alcohol intake to no more than 2 drinks per day. One drink equals 12 ounces of beer, 5 ounces of Jossalyn Forgione, or 1 ounces of hard liquor.  Do not use street drugs.  Do not share needles.  Ask your health care provider for help if you need support or information about quitting drugs.  Tell your health care provider if you often feel depressed.  Tell your health care provider if you have ever been abused or do not feel safe at home. This information is not intended to replace advice given to you by your health care provider. Make sure you discuss any questions you have with your health care provider. Document Released: 05/03/2008 Document Revised: 07/04/2016 Document Reviewed: 08/09/2015 Elsevier Interactive Patient Education  [employer].

## 2018-08-12 ENCOUNTER — Ambulatory Visit: Payer: Medicare HMO

## 2018-08-13 ENCOUNTER — Ambulatory Visit (INDEPENDENT_AMBULATORY_CARE_PROVIDER_SITE_OTHER): Payer: Medicare HMO

## 2018-08-13 DIAGNOSIS — Z23 Encounter for immunization: Secondary | ICD-10-CM | POA: Diagnosis not present

## 2018-08-14 ENCOUNTER — Ambulatory Visit: Payer: Medicare HMO

## 2018-09-11 DIAGNOSIS — R351 Nocturia: Secondary | ICD-10-CM | POA: Diagnosis not present

## 2018-09-11 DIAGNOSIS — R3915 Urgency of urination: Secondary | ICD-10-CM | POA: Diagnosis not present

## 2018-09-11 DIAGNOSIS — C61 Malignant neoplasm of prostate: Secondary | ICD-10-CM | POA: Diagnosis not present

## 2018-09-11 DIAGNOSIS — N401 Enlarged prostate with lower urinary tract symptoms: Secondary | ICD-10-CM | POA: Diagnosis not present

## 2018-09-11 DIAGNOSIS — R350 Frequency of micturition: Secondary | ICD-10-CM | POA: Diagnosis not present

## 2018-09-24 ENCOUNTER — Ambulatory Visit (INDEPENDENT_AMBULATORY_CARE_PROVIDER_SITE_OTHER): Payer: Medicare HMO | Admitting: Internal Medicine

## 2018-09-24 ENCOUNTER — Other Ambulatory Visit (INDEPENDENT_AMBULATORY_CARE_PROVIDER_SITE_OTHER): Payer: Medicare HMO

## 2018-09-24 ENCOUNTER — Encounter: Payer: Self-pay | Admitting: Internal Medicine

## 2018-09-24 VITALS — BP 124/80 | HR 80 | Temp 97.9°F | Resp 16 | Ht 72.0 in | Wt 191.2 lb

## 2018-09-24 DIAGNOSIS — I10 Essential (primary) hypertension: Secondary | ICD-10-CM | POA: Diagnosis not present

## 2018-09-24 DIAGNOSIS — E785 Hyperlipidemia, unspecified: Secondary | ICD-10-CM

## 2018-09-24 LAB — CBC WITH DIFFERENTIAL/PLATELET
BASOS PCT: 0.4 % (ref 0.0–3.0)
Basophils Absolute: 0 10*3/uL (ref 0.0–0.1)
Eosinophils Absolute: 0.1 10*3/uL (ref 0.0–0.7)
Eosinophils Relative: 1.5 % (ref 0.0–5.0)
HEMATOCRIT: 44.7 % (ref 39.0–52.0)
Hemoglobin: 15.1 g/dL (ref 13.0–17.0)
LYMPHS ABS: 1.2 10*3/uL (ref 0.7–4.0)
LYMPHS PCT: 22.9 % (ref 12.0–46.0)
MCHC: 33.8 g/dL (ref 30.0–36.0)
MCV: 91 fl (ref 78.0–100.0)
Monocytes Absolute: 0.4 10*3/uL (ref 0.1–1.0)
Monocytes Relative: 8.3 % (ref 3.0–12.0)
NEUTROS ABS: 3.5 10*3/uL (ref 1.4–7.7)
NEUTROS PCT: 66.9 % (ref 43.0–77.0)
PLATELETS: 309 10*3/uL (ref 150.0–400.0)
RBC: 4.91 Mil/uL (ref 4.22–5.81)
RDW: 13.8 % (ref 11.5–15.5)
WBC: 5.3 10*3/uL (ref 4.0–10.5)

## 2018-09-24 LAB — LIPID PANEL
CHOLESTEROL: 215 mg/dL — AB (ref 0–200)
HDL: 71.1 mg/dL (ref >39.00)
LDL CALC: 129 mg/dL — AB (ref 0–99)
NonHDL: 144
TRIGLYCERIDES: 76 mg/dL (ref 0.0–149.0)
Total CHOL/HDL Ratio: 3
VLDL: 15.2 mg/dL (ref 0.0–40.0)

## 2018-09-24 LAB — COMPREHENSIVE METABOLIC PANEL
ALBUMIN: 4.7 g/dL (ref 3.5–5.2)
ALT: 12 U/L (ref 0–53)
AST: 13 U/L (ref 0–37)
Alkaline Phosphatase: 69 U/L (ref 39–117)
BUN: 14 mg/dL (ref 6–23)
CALCIUM: 9.7 mg/dL (ref 8.4–10.5)
CHLORIDE: 103 meq/L (ref 96–112)
CO2: 27 meq/L (ref 19–32)
Creatinine, Ser: 0.85 mg/dL (ref 0.40–1.50)
GFR: 93.67 mL/min (ref >60.00)
Glucose, Bld: 97 mg/dL (ref 70–99)
POTASSIUM: 3.9 meq/L (ref 3.5–5.1)
Sodium: 139 mEq/L (ref 135–145)
Total Bilirubin: 0.7 mg/dL (ref 0.2–1.2)
Total Protein: 7.2 g/dL (ref 6.0–8.3)

## 2018-09-24 LAB — TSH: TSH: 1.82 u[IU]/mL (ref 0.35–4.50)

## 2018-09-24 MED ORDER — PITAVASTATIN CALCIUM 2 MG PO TABS: 1.0000 | ORAL_TABLET | Freq: Every day | ORAL | 1 refills | Status: DC

## 2018-09-24 NOTE — Progress Notes (Signed)
Subjective:  Patient ID: Dan Dudley, male    DOB: 1946/05/25  Age: 72 y.o. MRN: [id number]  CC: Hypertension and Hyperlipidemia   HPI Dan Dudley presents for f/up - He feels well and offers no complaints.  He is very active and denies any recent episodes of CP, DOE, palpitations, edema, or fatigue.  He is not taking Lipitor because it caused side effects.  He is not taking an antihypertensive.  Outpatient Medications Prior to Visit  Medication Sig Dispense Refill  . acetaminophen (TYLENOL) 325 MG tablet Take 650 mg by mouth every 6 (six) hours as needed.    Marland Kitchen aspirin EC 325 MG tablet Take 325 mg by mouth as needed.    . calcium carbonate (TUMS - DOSED IN MG ELEMENTAL CALCIUM) 500 MG chewable tablet Chew 1 tablet by mouth daily as needed for heartburn.    Marland Kitchen leuprolide (LUPRON DEPOT, 45-MONTH,) 11.25 MG injection 7.5 mg.     . mirabegron ER (MYRBETRIQ) 50 MG TB24 tablet Take 1 tablet (50 mg total) by mouth daily. 30 tablet 4  . omeprazole (PRILOSEC) 20 MG capsule Take 20 mg by mouth every morning.    . tamsulosin (FLOMAX) 0.4 MG CAPS capsule Take 1 capsule (0.4 mg total) by mouth daily after supper. (Patient taking differently: Take 0.4 mg by mouth 2 (two) times daily. ) 30 capsule 5  . atorvastatin (LIPITOR) 20 MG tablet Take 1 tablet (20 mg total) by mouth daily. 90 tablet 1  . ibuprofen (ADVIL,MOTRIN) 200 MG tablet Take 200 mg by mouth every 6 (six) hours as needed.    . mirabegron ER (MYRBETRIQ) 25 MG TB24 tablet Take 1 tablet (25 mg total) by mouth daily. 30 tablet 0   No facility-administered medications prior to visit.     ROS Review of Systems  Constitutional: Negative.  Negative for diaphoresis, fatigue and unexpected weight change.  HENT: Negative.   Eyes: Negative.   Respiratory: Negative for apnea, cough, shortness of breath and wheezing.   Cardiovascular: Negative for chest pain, palpitations and leg swelling.  Gastrointestinal: Negative for  abdominal pain, constipation, diarrhea, nausea and vomiting.  Endocrine: Negative.   Genitourinary: Negative.   Musculoskeletal: Negative.  Negative for arthralgias and myalgias.  Skin: Negative.   Neurological: Negative.  Negative for dizziness and weakness.  Hematological: Negative for adenopathy. Does not bruise/bleed easily.  Psychiatric/Behavioral: Negative.     Objective:  BP 124/80 (BP Location: Left Arm, Patient Position: Sitting, Cuff Size: Normal)   Pulse 80   Temp 97.9 F (36.6 C) (Oral)   Resp 16   Ht 6' (1.829 m)   Wt 191 lb 4 oz (86.8 kg)   SpO2 91%   BMI 25.94 kg/m   BP Readings from Last 3 Encounters:  09/24/18 124/80  06/30/18 138/82  02/17/18 126/80    Wt Readings from Last 3 Encounters:  09/24/18 191 lb 4 oz (86.8 kg)  06/30/18 189 lb (85.7 kg)  02/17/18 192 lb 1.3 oz (87.1 kg)    Physical Exam  Constitutional: He is oriented to person, place, and time. No distress.  HENT:  Mouth/Throat: Oropharynx is clear and moist. No oropharyngeal exudate.  Eyes: Conjunctivae are normal. No scleral icterus.  Neck: Normal range of motion. Neck supple. No JVD present. No thyromegaly present.  Cardiovascular: Normal rate, regular rhythm and normal heart sounds.  No murmur heard. Pulmonary/Chest: Effort normal and breath sounds normal. No respiratory distress. He has no wheezes. He has no rhonchi. He has  no rales.  Abdominal: Soft. Bowel sounds are normal. He exhibits no mass. There is no hepatosplenomegaly. There is no tenderness.  Musculoskeletal: Normal range of motion. He exhibits no edema, tenderness or deformity.  Lymphadenopathy:    He has no cervical adenopathy.  Neurological: He is alert and oriented to person, place, and time.  Skin: Skin is warm and dry. No rash noted. He is not diaphoretic.  Vitals reviewed.   Lab Results  Component Value Date   WBC 5.3 09/24/2018   HGB 15.1 09/24/2018   HCT 44.7 09/24/2018   PLT 309.0 09/24/2018   GLUCOSE 97  09/24/2018   CHOL 215 (H) 09/24/2018   TRIG 76.0 09/24/2018   HDL 71.10 09/24/2018   LDLDIRECT 134.3 03/27/2010   LDLCALC 129 (H) 09/24/2018   ALT 12 09/24/2018   AST 13 09/24/2018   NA 139 09/24/2018   K 3.9 09/24/2018   CL 103 09/24/2018   CREATININE 0.85 09/24/2018   BUN 14 09/24/2018   CO2 27 09/24/2018   TSH 1.82 09/24/2018   PSA 0.045 04/22/2018   INR 1.00 10/08/2017    No results found.  Assessment & Plan:   Dan Dudley was seen today for hypertension and hyperlipidemia.  Diagnoses and all orders for this visit:  Essential hypertension- His blood pressure is adequately well controlled with lifestyle modifications.  His labs are negative for secondary causes or endorgan damage. -     Comprehensive metabolic panel; Future -     TSH; Future -     CBC with Differential/Platelet; Future  Hyperlipidemia with target LDL less than 130- He has an elevated ASCVD risk score so I have asked him to try a different statin with a better side effect profile for CV risk reduction. -     Lipid panel; Future -     TSH; Future -     Pitavastatin Calcium (LIVALO) 2 MG TABS; Take 1 tablet (2 mg total) by mouth daily.   I have discontinued Dan Derasmo. Dudley's ibuprofen and atorvastatin. I am also having him start on Pitavastatin Calcium. Additionally, I am having him maintain his calcium carbonate, aspirin EC, omeprazole, tamsulosin, acetaminophen, mirabegron ER, and leuprolide.  Meds ordered this encounter  Medications  . Pitavastatin Calcium (LIVALO) 2 MG TABS    Sig: Take 1 tablet (2 mg total) by mouth daily.    Dispense:  90 tablet    Refill:  1     Follow-up: Return in about 6 months (around 03/25/2019).  Scarlette Calico, MD

## 2018-09-24 NOTE — Patient Instructions (Signed)

## 2018-09-24 NOTE — Telephone Encounter (Signed)
[facility]ivalo needs a PA. Key: [id number]

## 2018-09-25 DIAGNOSIS — M25562 Pain in left knee: Secondary | ICD-10-CM | POA: Insufficient documentation

## 2018-09-25 NOTE — Telephone Encounter (Signed)
PA has been approved.  Mychart message sent to patient informing of same.  

## 2018-09-30 ENCOUNTER — Telehealth: Payer: Self-pay | Admitting: *Deleted

## 2018-09-30 NOTE — Telephone Encounter (Signed)
[facility] PA is approved 11/17/2017-11/19/2019

## 2018-10-11 DIAGNOSIS — M25562 Pain in left knee: Secondary | ICD-10-CM | POA: Diagnosis not present

## 2018-10-15 DIAGNOSIS — M25562 Pain in left knee: Secondary | ICD-10-CM | POA: Diagnosis not present

## 2018-10-30 DIAGNOSIS — C61 Malignant neoplasm of prostate: Secondary | ICD-10-CM | POA: Diagnosis not present

## 2018-11-06 DIAGNOSIS — R3915 Urgency of urination: Secondary | ICD-10-CM | POA: Diagnosis not present

## 2018-11-06 DIAGNOSIS — C61 Malignant neoplasm of prostate: Secondary | ICD-10-CM | POA: Diagnosis not present

## 2018-11-06 DIAGNOSIS — R350 Frequency of micturition: Secondary | ICD-10-CM | POA: Diagnosis not present

## 2019-01-26 DIAGNOSIS — H524 Presbyopia: Secondary | ICD-10-CM | POA: Diagnosis not present

## 2019-04-23 LAB — PSA: PSA: 0.67

## 2019-04-30 DIAGNOSIS — C61 Malignant neoplasm of prostate: Secondary | ICD-10-CM | POA: Diagnosis not present

## 2019-05-08 DIAGNOSIS — C61 Malignant neoplasm of prostate: Secondary | ICD-10-CM | POA: Diagnosis not present

## 2019-05-08 DIAGNOSIS — R3915 Urgency of urination: Secondary | ICD-10-CM | POA: Diagnosis not present

## 2019-07-15 ENCOUNTER — Other Ambulatory Visit: Payer: Self-pay

## 2019-07-15 ENCOUNTER — Ambulatory Visit (INDEPENDENT_AMBULATORY_CARE_PROVIDER_SITE_OTHER): Payer: Medicare HMO

## 2019-07-15 DIAGNOSIS — Z23 Encounter for immunization: Secondary | ICD-10-CM | POA: Diagnosis not present

## 2019-08-04 ENCOUNTER — Ambulatory Visit (INDEPENDENT_AMBULATORY_CARE_PROVIDER_SITE_OTHER): Payer: Medicare HMO | Admitting: *Deleted

## 2019-08-04 DIAGNOSIS — Z0000 Encounter for general adult medical examination without abnormal findings: Secondary | ICD-10-CM

## 2019-08-04 DIAGNOSIS — R69 Illness, unspecified: Secondary | ICD-10-CM | POA: Diagnosis not present

## 2019-08-04 NOTE — Progress Notes (Addendum)
Subjective:   Dan Dudley is a 73 y.o. male who presents for Medicare Annual/Subsequent preventive examination. I connected with patient by a telephone and verified that I am speaking with the correct person using two identifiers. Patient stated full name and DOB. Patient gave permission to continue with telephonic visit. Patient's location was at home and Nurse's location was at [place] office.   Review of Systems:     Sleep patterns: feels rested on waking, gets up 3-6 times nightly to void and sleeps 6 hours nightly.  Home Safety/Smoke Alarms: Feels safe in home. Smoke alarms in place.  Living environment; residence and Firearm Safety: 1-story house/ trailer. Lives with wife, no needs for DME, good support system Seat Belt Safety/Bike Helmet: Wears seat belt. PSA-  Lab Results  Component Value Date   PSA 0.045 04/22/2018   PSA 9.03 (H) 05/02/2017   PSA 7.23 (H) 04/23/2016       Objective:    Vitals: There were no vitals taken for this visit.  There is no height or weight on file to calculate BMI.  Advanced Directives 08/04/2019 06/30/2018 01/22/2018 10/16/2017 07/15/2017 05/05/2017 04/24/2016  Does Patient Have a Medical Advance Directive? Yes Yes Yes Yes Yes Yes Yes  Type of Paramedic of [place];Living will [place];Living will [place];Living will [place];Living will Living will;Healthcare Power of [place];Living will [place];Living will  Does patient want to make changes to medical advance directive? - - - - No - Patient declined - No - Patient declined  Copy of [place] in Chart? No - copy requested No - copy requested - No - copy requested No - copy requested Yes Yes    Tobacco Social History   Tobacco Use  Smoking Status Former Smoker  . Packs/day: 0.25  . Years: 8.00  . Pack years: 2.00  . Types: Cigarettes   . Quit date: 11/19/1968  . Years since quitting: 50.7  Smokeless Tobacco Never Used     Counseling given: Not Answered  Past Medical History:  Diagnosis Date  . GERD (gastroesophageal reflux disease)   . Headache(784.0)   . Hemorrhoids    internal/ external  . Hyperlipidemia    per pt watching diet  . Hypertension per pt thinks its white coat because at home normal bp   per pt currently no medication as trial w/ pcp ok (was taking micardis 40mg )  . Nocturia more than twice per night   . Nodular hyperplasia of prostate gland   . Prostate cancer [facility]) urologist-  dr budzyn/  oncologist-  dr Tammi Klippel   dx 07-15-2017 (bx)-- Stage T1c, Gleason 4+3, PSA 9.06, vol 80.2cc-- planned radiationtherpy   Past Surgical History:  Procedure Laterality Date  . COLONOSCOPY  last one 08-15-2010  . GOLD SEED IMPLANT N/A 10/16/2017   Procedure: GOLD SEED IMPLANT;  Surgeon: Nickie Retort, MD;  Location: [facility];  Service: Urology;  Laterality: N/A;  . HEMANGIOMA EXCISION  09/25/2004   elliptical excision left lower lip  . INGUINAL HERNIA REPAIR Bilateral left 10-25-2000  dr Hassell Done  right [place]  . PROSTATE BIOPSY N/A 07/15/2017   Procedure: BIOPSY TRANSRECTAL ULTRASONIC PROSTATE (TUBP);  Surgeon: Nickie Retort, MD;  Location: [facility];  Service: Urology;  Laterality: N/A;  . SPACE OAR INSTILLATION N/A 10/16/2017   Procedure: SPACE OAR INSTILLATION;  Surgeon: Nickie Retort, MD;  Location: [facility];  Service: Urology;  Laterality: N/A;   Family History  Problem Relation Age of Onset  . COPD Other   . Heart disease Father        age 66, nonsmoker, heavy drinker  . Cancer Mother        unknown   Social History   Socioeconomic History  . Marital status: Married    Spouse name: Not on file  . Number of children: 2  . Years of education: Not on file  . Highest education level: Not on file  Occupational History  .  Occupation: Retired    Fish farm manager: SELF-EMPLOYED  Social Needs  . Financial resource strain: Not hard at all  . Food insecurity    Worry: Never true    Inability: Never true  . Transportation needs    Medical: No    Non-medical: No  Tobacco Use  . Smoking status: Former Smoker    Packs/day: 0.25    Years: 8.00    Pack years: 2.00    Types: Cigarettes    Quit date: 11/19/1968    Years since quitting: 50.7  . Smokeless tobacco: Never Used  Substance and Sexual Activity  . Alcohol use: Yes    Comment: occasional  . Drug use: No  . Sexual activity: Yes  Lifestyle  . Physical activity    Days per week: 5 days    Minutes per session: 50 min  . Stress: Not at all  Relationships  . Social connections    Talks on phone: More than three times a week    Gets together: More than three times a week    Attends religious service: 1 to 4 times per year    Active member of club or organization: Yes    Attends meetings of clubs or organizations: 1 to 4 times per year    Relationship status: Married  Other Topics Concern  . Not on file  Social History Narrative   Married Ray 2 kids. Randall Hiss lives in [place], [place] -bachelor and Radio producer; Katharine Look in [place] with a boy and girl (21 in 2015).    Missy-toy poodle (9).       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 acetaminophen (TYLENOL) 325 MG tablet Take 650 mg by mouth every 6 (six) hours as needed.  Marland Kitchen aspirin EC 325 MG tablet Take 325 mg by mouth as needed.  . calcium carbonate (TUMS - DOSED IN MG ELEMENTAL CALCIUM) 500 MG chewable tablet Chew 1 tablet by mouth daily as needed for heartburn.  . mirabegron ER (MYRBETRIQ) 50 MG TB24 tablet Take 1 tablet (50 mg total) by mouth daily.  Marland Kitchen omeprazole (PRILOSEC) 20 MG capsule Take 20 mg by mouth every morning.  . tamsulosin (FLOMAX) 0.4 MG CAPS capsule Take 1 capsule (0.4 mg  total) by mouth daily after supper. (Patient taking differently: Take by mouth 2 (two) times daily. )  . leuprolide (LUPRON DEPOT, 26-MONTH,) 11.25 MG injection 7.5 mg.   . [DISCONTINUED] Pitavastatin Calcium (LIVALO) 2 MG TABS Take 1 tablet (2 mg total) by mouth daily.   No facility-administered encounter medications on file as of 08/04/2019.     Activities of Daily Living In your present state of health, do you have any difficulty performing the following activities: 08/04/2019  Hearing? N  Vision? N  Difficulty concentrating  or making decisions? N  Walking or climbing stairs? N  Dressing or bathing? N  Doing errands, shopping? N  Preparing Food and eating ? N  Using the Toilet? N  In the past six months, have you accidently leaked urine? N  Do you have problems with loss of bowel control? N  Managing your Medications? N  Managing your Finances? N  Housekeeping or managing your Housekeeping? N  Some recent data might be hidden    Patient Care Team: Janith Lima, MD as PCP - General (Internal Medicine)   Assessment:   This is a routine wellness examination for [place]. Physical assessment deferred to PCP.   Exercise Activities and Dietary recommendations Current Exercise Habits: Home exercise routine, Type of exercise: walking(Has 10 acres of land to care that he keeps up), Exercise limited by: None identified Diet (meal preparation, eat out, water intake, caffeinated beverages, dairy products, fruits and vegetables): in general, a "healthy" diet  , well balanced   Reviewed heart healthy diet. Encouraged patient to increase daily water and healthy fluid intake.  Goals    . Patient Stated     Stay as healthy and as independent as possible.       Fall Risk Fall Risk  08/04/2019 06/30/2018 01/22/2018 08/19/2017 08/19/2017  Falls in the past year? 0 No No No No  Number falls in past yr: 0 - - - -  Injury with Fall? 0 - - - -   Is the patient's home free of loose throw  rugs in walkways, pet beds, electrical cords, etc?   yes      Grab bars in the bathroom? yes      Handrails on the stairs?   yes      Adequate lighting?   yes  Depression Screen PHQ 2/9 Scores 08/04/2019 06/30/2018 01/22/2018 08/19/2017  PHQ - 2 Score 0 1 1 0  PHQ- 9 Score - 4 - -    Cognitive Function       Ad8 score reviewed for issues:  Issues making decisions: no  Less interest in hobbies / activities: no  Repeats questions, stories (family complaining): no  Trouble using ordinary gadgets (microwave, computer, phone):no  Forgets the month or year: no  Mismanaging finances: no  Remembering appts: no  Daily problems with thinking and/or memory: no Ad8 score is= 0  Immunization History  Administered Date(s) Administered  . Fluad Quad(high Dose 65+) 07/15/2019  . Influenza Split 10/04/2011, 08/19/2012  . Influenza Whole 08/09/2010  . Influenza, High Dose Seasonal PF 08/07/2016, 08/13/2017, 08/13/2018  . Influenza,inj,Quad PF,6+ Mos 08/14/2013, 08/13/2014, 08/17/2015  . Pneumococcal Conjugate-13 10/19/2013  . Pneumococcal Polysaccharide-23 08/17/2015  . Td 11/19/2001  . Tdap 09/25/2012  . Zoster 05/14/2011    Screening Tests Health Maintenance  Topic Date Due  . COLONOSCOPY  08/15/2020  . TETANUS/TDAP  09/25/2022  . INFLUENZA VACCINE  Completed  . Hepatitis C Screening  Completed  . PNA vac Low Risk Adult  Completed      Plan:      Reviewed health maintenance screenings with patient today and relevant education, vaccines, and/or referrals were provided.   Continue to eat heart healthy diet (full of fruits, vegetables, whole grains, lean protein, water--limit salt, fat, and sugar intake) and increase physical activity as tolerated.  Continue doing brain stimulating activities (puzzles, reading, adult coloring books, staying active) to keep memory sharp.   I have personally reviewed and noted the following in the patient's chart:   .  Medical and social  history . Use of alcohol, tobacco or illicit drugs  . Current medications and supplements . Functional ability and status . Nutritional status . Physical activity . Advanced directives . List of other physicians . Screenings to include cognitive, depression, and falls . Referrals and appointments  In addition, I have reviewed and discussed with patient certain preventive protocols, quality metrics, and best practice recommendations. A written personalized care plan for preventive services as well as general preventive health recommendations were provided to patient.     Michiel Cowboy, RN  08/04/2019   Medical screening examination/treatment/procedure(s) were performed by non-physician practitioner and as supervising physician I was immediately available for consultation/collaboration. I agree with above. Scarlette Calico, MD

## 2019-08-12 ENCOUNTER — Encounter: Payer: Self-pay | Admitting: Internal Medicine

## 2019-08-12 ENCOUNTER — Ambulatory Visit (INDEPENDENT_AMBULATORY_CARE_PROVIDER_SITE_OTHER): Payer: Medicare HMO | Admitting: Internal Medicine

## 2019-08-12 ENCOUNTER — Other Ambulatory Visit: Payer: Self-pay

## 2019-08-12 ENCOUNTER — Other Ambulatory Visit (INDEPENDENT_AMBULATORY_CARE_PROVIDER_SITE_OTHER): Payer: Medicare HMO

## 2019-08-12 VITALS — BP 156/94 | HR 67 | Temp 98.4°F | Ht 72.0 in | Wt 184.0 lb

## 2019-08-12 DIAGNOSIS — E785 Hyperlipidemia, unspecified: Secondary | ICD-10-CM | POA: Diagnosis not present

## 2019-08-12 DIAGNOSIS — E559 Vitamin D deficiency, unspecified: Secondary | ICD-10-CM | POA: Diagnosis not present

## 2019-08-12 DIAGNOSIS — I10 Essential (primary) hypertension: Secondary | ICD-10-CM

## 2019-08-12 DIAGNOSIS — Z23 Encounter for immunization: Secondary | ICD-10-CM | POA: Insufficient documentation

## 2019-08-12 LAB — HEPATIC FUNCTION PANEL
ALT: 11 U/L (ref 0–53)
AST: 15 U/L (ref 0–37)
Albumin: 4.7 g/dL (ref 3.5–5.2)
Alkaline Phosphatase: 52 U/L (ref 39–117)
Bilirubin, Direct: 0.1 mg/dL (ref 0.0–0.3)
Total Bilirubin: 1.1 mg/dL (ref 0.2–1.2)
Total Protein: 7.3 g/dL (ref 6.0–8.3)

## 2019-08-12 LAB — TSH: TSH: 1.07 u[IU]/mL (ref 0.35–4.50)

## 2019-08-12 LAB — CBC WITH DIFFERENTIAL/PLATELET
Basophils Absolute: 0 10*3/uL (ref 0.0–0.1)
Basophils Relative: 0.3 % (ref 0.0–3.0)
Eosinophils Absolute: 0.1 10*3/uL (ref 0.0–0.7)
Eosinophils Relative: 2.2 % (ref 0.0–5.0)
HCT: 46.2 % (ref 39.0–52.0)
Hemoglobin: 15.3 g/dL (ref 13.0–17.0)
Lymphocytes Relative: 22.1 % (ref 12.0–46.0)
Lymphs Abs: 1.1 10*3/uL (ref 0.7–4.0)
MCHC: 33.2 g/dL (ref 30.0–36.0)
MCV: 92.9 fl (ref 78.0–100.0)
Monocytes Absolute: 0.5 10*3/uL (ref 0.1–1.0)
Monocytes Relative: 9.8 % (ref 3.0–12.0)
Neutro Abs: 3.4 10*3/uL (ref 1.4–7.7)
Neutrophils Relative %: 65.6 % (ref 43.0–77.0)
Platelets: 290 10*3/uL (ref 150.0–400.0)
RBC: 4.98 Mil/uL (ref 4.22–5.81)
RDW: 14.6 % (ref 11.5–15.5)
WBC: 5.1 10*3/uL (ref 4.0–10.5)

## 2019-08-12 LAB — BASIC METABOLIC PANEL
BUN: 9 mg/dL (ref 6–23)
CO2: 28 mEq/L (ref 19–32)
Calcium: 10 mg/dL (ref 8.4–10.5)
Chloride: 102 mEq/L (ref 96–112)
Creatinine, Ser: 0.88 mg/dL (ref 0.40–1.50)
GFR: 84.47 mL/min (ref >60.00)
Glucose, Bld: 99 mg/dL (ref 70–99)
Potassium: 4.3 mEq/L (ref 3.5–5.1)
Sodium: 139 mEq/L (ref 135–145)

## 2019-08-12 LAB — LIPID PANEL
Cholesterol: 208 mg/dL — ABNORMAL HIGH (ref 0–200)
HDL: 67 mg/dL (ref >39.00)
LDL Cholesterol: 123 mg/dL — ABNORMAL HIGH (ref 0–99)
NonHDL: 141.15
Total CHOL/HDL Ratio: 3
Triglycerides: 91 mg/dL (ref 0.0–149.0)
VLDL: 18.2 mg/dL (ref 0.0–40.0)

## 2019-08-12 LAB — VITAMIN D 25 HYDROXY (VIT D DEFICIENCY, FRACTURES): VITD: 16.61 ng/mL — ABNORMAL LOW (ref 30.00–100.00)

## 2019-08-12 MED ORDER — SHINGRIX 50 MCG/0.5ML IM SUSR: 0.5000 mL | Freq: Once | INTRAMUSCULAR | 1 refills | Status: AC

## 2019-08-12 MED ORDER — CHOLECALCIFEROL 50 MCG (2000 UT) PO TABS: 2.0000 | ORAL_TABLET | Freq: Every day | ORAL | 1 refills | Status: DC

## 2019-08-12 NOTE — Patient Instructions (Signed)

## 2019-08-12 NOTE — Progress Notes (Signed)
Subjective:  Patient ID: Dan Dudley, male    DOB: [birth year]  Age: 73 y.o. MRN: [id number]  CC: Hypertension   HPI Dan Dudley presents for f/up - He feels well and offers no complaints.  He monitors his blood pressure at home and he says it is always well controlled.  He is very active and denies any recent episodes of CP, DOE, palpitations, edema, or fatigue.  Outpatient Medications Prior to Visit  Medication Sig Dispense Refill  . acetaminophen (TYLENOL) 325 MG tablet Take 650 mg by mouth every 6 (six) hours as needed.    Marland Kitchen aspirin EC 325 MG tablet Take 325 mg by mouth as needed.    . calcium carbonate (TUMS - DOSED IN MG ELEMENTAL CALCIUM) 500 MG chewable tablet Chew 1 tablet by mouth daily as needed for heartburn.    . mirabegron ER (MYRBETRIQ) 50 MG TB24 tablet Take 1 tablet (50 mg total) by mouth daily. 30 tablet 4  . omeprazole (PRILOSEC) 20 MG capsule Take 20 mg by mouth every morning.    . tamsulosin (FLOMAX) 0.4 MG CAPS capsule Take 1 capsule (0.4 mg total) by mouth daily after supper. (Patient taking differently: Take by mouth 2 (two) times daily. ) 30 capsule 5  . leuprolide (LUPRON DEPOT, 57-MONTH,) 11.25 MG injection 7.5 mg.      No facility-administered medications prior to visit.     ROS Review of Systems  Constitutional: Negative for diaphoresis and fatigue.  HENT: Negative.   Eyes: Negative for visual disturbance.  Respiratory: Negative for cough, chest tightness, shortness of breath and wheezing.   Cardiovascular: Negative for chest pain, palpitations and leg swelling.  Gastrointestinal: Negative for abdominal pain, constipation, diarrhea, nausea and vomiting.  Endocrine: Negative.   Genitourinary: Negative.  Negative for difficulty urinating and hematuria.  Musculoskeletal: Negative for arthralgias and myalgias.  Skin: Negative.  Negative for color change and pallor.  Neurological: Negative.  Negative for dizziness, weakness,  light-headedness and headaches.  Hematological: Negative for adenopathy. Does not bruise/bleed easily.  Psychiatric/Behavioral: Negative.     Objective:  BP (!) 156/94 (BP Location: Left Arm, Patient Position: Sitting, Cuff Size: Normal) Comment: BP (R) 156/94 (L) 144/96  Pulse 67   Temp 98.4 F (36.9 C) (Oral)   Ht 6' (1.829 m)   Wt 184 lb (83.5 kg)   SpO2 97%   BMI 24.95 kg/m   BP Readings from Last 3 Encounters:  08/12/19 (!) 156/94  09/24/18 124/80  06/30/18 138/82    Wt Readings from Last 3 Encounters:  08/12/19 184 lb (83.5 kg)  09/24/18 191 lb 4 oz (86.8 kg)  06/30/18 189 lb (85.7 kg)    Physical Exam Vitals signs reviewed.  Constitutional:      Appearance: Normal appearance.  HENT:     Nose: Nose normal.     Mouth/Throat:     Mouth: Mucous membranes are moist.  Eyes:     General: No scleral icterus.    Conjunctiva/sclera: Conjunctivae normal.  Neck:     Musculoskeletal: Normal range of motion. No neck rigidity.  Cardiovascular:     Rate and Rhythm: Normal rate and regular rhythm.     Heart sounds: No murmur.  Pulmonary:     Effort: Pulmonary effort is normal.     Breath sounds: No stridor. No wheezing, rhonchi or rales.  Abdominal:     General: Abdomen is flat. Bowel sounds are normal. There is no distension.     Palpations: Abdomen is  soft. There is no hepatomegaly, splenomegaly or mass.     Tenderness: There is no abdominal tenderness. There is no guarding.  Musculoskeletal: Normal range of motion.     Right lower leg: No edema.     Left lower leg: No edema.  Skin:    General: Skin is warm and dry.     Coloration: Skin is not pale.  Neurological:     General: No focal deficit present.     Mental Status: He is alert and oriented to person, place, and time. Mental status is at baseline.  Psychiatric:        Mood and Affect: Mood normal.        Behavior: Behavior normal.     Lab Results  Component Value Date   WBC 5.1 08/12/2019   HGB 15.3  08/12/2019   HCT 46.2 08/12/2019   PLT 290.0 08/12/2019   GLUCOSE 99 08/12/2019   CHOL 208 (H) 08/12/2019   TRIG 91.0 08/12/2019   HDL 67.00 08/12/2019   LDLDIRECT 134.3 03/27/2010   LDLCALC 123 (H) 08/12/2019   ALT 11 08/12/2019   AST 15 08/12/2019   NA 139 08/12/2019   K 4.3 08/12/2019   CL 102 08/12/2019   CREATININE 0.88 08/12/2019   BUN 9 08/12/2019   CO2 28 08/12/2019   TSH 1.07 08/12/2019   PSA 0.670 04/23/2019   INR 1.00 10/08/2017    No results found.  Assessment & Plan:   Dan Dudley was seen today for hypertension.  Diagnoses and all orders for this visit:  Essential hypertension- His blood pressure is elevated here but he says it is normal at home.  This could be the whitecoat phenomenon.  He is not willing to take an antihypertensive.  I will treat the vitamin D deficiency.  Otherwise his labs are negative for secondary causes or endorgan damage. -     CBC with Differential/Platelet; Future -     Basic metabolic panel; Future -     TSH; Future -     VITAMIN D 25 Hydroxy (Vit-D Deficiency, Fractures); Future  Hyperlipidemia with target LDL less than 130- He has an elevated ASCVD risk score but is not willing to take a statin for CV risk reduction.  I have asked him to let me know if he changes his mind about this. -     Lipid panel; Future -     TSH; Future -     Hepatic function panel; Future  Need for shingles vaccine -     Zoster Vaccine Adjuvanted [facility]) injection; Inject 0.5 mLs into the muscle once for 1 dose.  Vitamin D deficiency -     Cholecalciferol 50 MCG (2000 UT) TABS; Take 2 tablets (4,000 Units total) by mouth daily.   I have discontinued Dan Dudley leuprolide. I am also having him start on Shingrix and Cholecalciferol. Additionally, I am having him maintain his calcium carbonate, aspirin EC, omeprazole, tamsulosin, acetaminophen, and mirabegron ER.  Meds ordered this encounter  Medications  . Zoster Vaccine Adjuvanted  [facility]) injection    Sig: Inject 0.5 mLs into the muscle once for 1 dose.    Dispense:  0.5 mL    Refill:  1  . Cholecalciferol 50 MCG (2000 UT) TABS    Sig: Take 2 tablets (4,000 Units total) by mouth daily.    Dispense:  180 tablet    Refill:  1     Follow-up: Return in about 6 months (around 02/09/2020).  Scarlette Calico,  MD

## 2019-11-05 DIAGNOSIS — C61 Malignant neoplasm of prostate: Secondary | ICD-10-CM | POA: Diagnosis not present

## 2019-11-12 DIAGNOSIS — R351 Nocturia: Secondary | ICD-10-CM | POA: Diagnosis not present

## 2019-11-12 DIAGNOSIS — C61 Malignant neoplasm of prostate: Secondary | ICD-10-CM | POA: Diagnosis not present

## 2020-01-28 DIAGNOSIS — H40053 Ocular hypertension, bilateral: Secondary | ICD-10-CM | POA: Diagnosis not present

## 2020-01-28 DIAGNOSIS — H2513 Age-related nuclear cataract, bilateral: Secondary | ICD-10-CM | POA: Diagnosis not present

## 2020-02-04 DIAGNOSIS — R69 Illness, unspecified: Secondary | ICD-10-CM | POA: Diagnosis not present

## 2020-02-09 DIAGNOSIS — R69 Illness, unspecified: Secondary | ICD-10-CM | POA: Diagnosis not present

## 2020-04-13 DIAGNOSIS — M79604 Pain in right leg: Secondary | ICD-10-CM | POA: Diagnosis not present

## 2020-04-26 DIAGNOSIS — M79604 Pain in right leg: Secondary | ICD-10-CM | POA: Diagnosis not present

## 2020-05-03 DIAGNOSIS — M545 Low back pain: Secondary | ICD-10-CM | POA: Diagnosis not present

## 2020-05-09 DIAGNOSIS — C61 Malignant neoplasm of prostate: Secondary | ICD-10-CM | POA: Diagnosis not present

## 2020-05-14 DIAGNOSIS — M79606 Pain in leg, unspecified: Secondary | ICD-10-CM | POA: Insufficient documentation

## 2020-05-14 DIAGNOSIS — M79604 Pain in right leg: Secondary | ICD-10-CM | POA: Diagnosis not present

## 2020-05-19 DIAGNOSIS — M545 Low back pain: Secondary | ICD-10-CM | POA: Diagnosis not present

## 2020-08-03 ENCOUNTER — Other Ambulatory Visit: Payer: Self-pay

## 2020-08-03 ENCOUNTER — Ambulatory Visit (INDEPENDENT_AMBULATORY_CARE_PROVIDER_SITE_OTHER): Payer: Medicare HMO

## 2020-08-03 DIAGNOSIS — Z23 Encounter for immunization: Secondary | ICD-10-CM

## 2020-08-04 DIAGNOSIS — H40053 Ocular hypertension, bilateral: Secondary | ICD-10-CM | POA: Diagnosis not present

## 2020-08-04 DIAGNOSIS — H2513 Age-related nuclear cataract, bilateral: Secondary | ICD-10-CM | POA: Diagnosis not present

## 2020-10-10 DIAGNOSIS — R69 Illness, unspecified: Secondary | ICD-10-CM | POA: Diagnosis not present

## 2020-10-19 DIAGNOSIS — M5459 Other low back pain: Secondary | ICD-10-CM | POA: Diagnosis not present

## 2020-10-19 DIAGNOSIS — M79604 Pain in right leg: Secondary | ICD-10-CM | POA: Diagnosis not present

## 2020-11-04 DIAGNOSIS — C61 Malignant neoplasm of prostate: Secondary | ICD-10-CM | POA: Diagnosis not present

## 2020-11-10 DIAGNOSIS — C61 Malignant neoplasm of prostate: Secondary | ICD-10-CM | POA: Diagnosis not present

## 2020-11-14 DIAGNOSIS — M545 Low back pain, unspecified: Secondary | ICD-10-CM | POA: Diagnosis not present

## 2020-11-22 DIAGNOSIS — M79604 Pain in right leg: Secondary | ICD-10-CM | POA: Diagnosis not present

## 2020-11-22 DIAGNOSIS — M5459 Other low back pain: Secondary | ICD-10-CM | POA: Diagnosis not present

## 2020-12-30 ENCOUNTER — Encounter: Payer: Self-pay | Admitting: Gastroenterology

## 2021-03-13 ENCOUNTER — Encounter: Payer: Self-pay | Admitting: Gastroenterology

## 2021-03-16 ENCOUNTER — Other Ambulatory Visit: Payer: Self-pay

## 2021-03-16 ENCOUNTER — Ambulatory Visit (AMBULATORY_SURGERY_CENTER): Payer: Self-pay | Admitting: *Deleted

## 2021-03-16 VITALS — Ht 70.0 in | Wt 179.4 lb

## 2021-03-16 DIAGNOSIS — Z1211 Encounter for screening for malignant neoplasm of colon: Secondary | ICD-10-CM

## 2021-03-16 MED ORDER — PEG 3350-KCL-NA BICARB-NACL 420 G PO SOLR: 4000.0000 mL | Freq: Once | ORAL | 0 refills | Status: AC

## 2021-03-16 NOTE — Progress Notes (Signed)
  No trouble with anesthesia, denies being told they were difficult to intubate, or hx/fam hx of malignant hyperthermia per pt   No egg or soy allergy  No home oxygen use   No medications for weight loss taken  Pt denies constipation issues  Pt informed that we do not do prior authorizations for prep  

## 2021-03-22 DIAGNOSIS — H43393 Other vitreous opacities, bilateral: Secondary | ICD-10-CM | POA: Diagnosis not present

## 2021-03-22 DIAGNOSIS — H40053 Ocular hypertension, bilateral: Secondary | ICD-10-CM | POA: Diagnosis not present

## 2021-03-22 DIAGNOSIS — H2513 Age-related nuclear cataract, bilateral: Secondary | ICD-10-CM | POA: Diagnosis not present

## 2021-03-30 ENCOUNTER — Encounter: Payer: Self-pay | Admitting: Gastroenterology

## 2021-04-06 ENCOUNTER — Encounter: Payer: Medicare HMO | Admitting: Gastroenterology

## 2021-04-18 ENCOUNTER — Ambulatory Visit
Admission: EM | Admit: 2021-04-18 | Discharge: 2021-04-18 | Disposition: A | Discharge status: Home / Self Care | Payer: Medicare HMO

## 2021-04-18 ENCOUNTER — Other Ambulatory Visit: Payer: Self-pay

## 2021-04-18 NOTE — ED Notes (Signed)
Patient does not have insurance card on him and wants to have that when seen.  Patient opted to leave

## 2021-04-19 ENCOUNTER — Ambulatory Visit
Admission: EM | Admit: 2021-04-19 | Discharge: 2021-04-19 | Disposition: A | Discharge status: Home / Self Care | Payer: Medicare HMO | Attending: Emergency Medicine | Admitting: Emergency Medicine

## 2021-04-19 ENCOUNTER — Other Ambulatory Visit: Payer: Self-pay

## 2021-04-19 ENCOUNTER — Encounter: Payer: Self-pay | Admitting: Emergency Medicine

## 2021-04-19 DIAGNOSIS — S50861A Insect bite (nonvenomous) of right forearm, initial encounter: Secondary | ICD-10-CM | POA: Diagnosis not present

## 2021-04-19 DIAGNOSIS — W57XXXA Bitten or stung by nonvenomous insect and other nonvenomous arthropods, initial encounter: Secondary | ICD-10-CM

## 2021-04-19 MED ORDER — LORATADINE 10 MG PO TABS: 10.0000 mg | ORAL_TABLET | Freq: Every day | ORAL | 0 refills | Status: DC

## 2021-04-19 MED ORDER — CEPHALEXIN 500 MG PO CAPS: 500.0000 mg | ORAL_CAPSULE | Freq: Four times a day (QID) | ORAL | 0 refills | Status: AC

## 2021-04-19 MED ORDER — TRIAMCINOLONE ACETONIDE 0.1 % EX CREA: 1.0000 "application " | TOPICAL_CREAM | Freq: Two times a day (BID) | CUTANEOUS | 0 refills | Status: DC

## 2021-04-19 NOTE — ED Provider Notes (Signed)
[facility]    CSN: [id number] Arrival date & time: 04/19/21  0804      History   Chief Complaint Chief Complaint  Patient presents with  . Insect Bite    HPI Dan Dudley is a 75 y.o. male history of GERD, hypertension, hyperlipidemia, prostate cancer, presenting today for evaluation of an insect bite.  Reports 2 nights ago remember being stung by wasp to his right forearm, later that evening he took his dog out and believes he was bit again by a different insect.  Since he has developed increased redness swelling and itching to his right forearm.  Denies significant pain.  Denies any numbness or tingling.  Denies limitations in movement.  Denies fever.  Reports redness spreading into hand as well as further up the arm.  HPI  Past Medical History:  Diagnosis Date  . GERD (gastroesophageal reflux disease)   . Headache(784.0)   . Heart murmur    "years ago"- 1990s  . Hemorrhoids    internal/ external  . Hyperlipidemia    per pt watching diet  . Hypertension per pt thinks its white coat because at home normal bp   per pt currently no medication as trial w/ pcp ok (was taking micardis 40mg )  . Nocturia more than twice per night   . Nodular hyperplasia of prostate gland   . Prostate cancer [facility]) urologist-  dr budzyn/  oncologist-  dr Tammi Klippel   dx 07-15-2017 (bx)-- Stage T1c, Gleason 4+3, PSA 9.06, vol 80.2cc-- planned radiationtherpy    Patient Active Problem List   Diagnosis Date Noted  . Need for shingles vaccine 08/12/2019  . Vitamin D deficiency 08/12/2019  . Nevus of face 05/02/2017  . Routine general medical examination at a health care facility 04/24/2016  . Hearing loss 04/23/2016  . Essential hypertension 04/23/2016  . GERD (gastroesophageal reflux disease) 08/13/2014  . PTSD (post-traumatic stress disorder) 05/09/2011  . Stage 1 adenocarcinoma of prostate ([place]) 04/03/2010  . BURSITIS, LEFT SHOULDER 01/31/2010  . Hyperlipidemia with target  LDL less than 130 07/22/2007    Past Surgical History:  Procedure Laterality Date  . COLONOSCOPY  last one 08-15-2010  . GOLD SEED IMPLANT N/A 10/16/2017   Procedure: GOLD SEED IMPLANT;  Surgeon: Nickie Retort, MD;  Location: [facility];  Service: Urology;  Laterality: N/A;  . HEMANGIOMA EXCISION  09/25/2004   elliptical excision left lower lip  . INGUINAL HERNIA REPAIR Bilateral left 10-25-2000  dr Hassell Done  right [place]  . PROSTATE BIOPSY N/A 07/15/2017   Procedure: BIOPSY TRANSRECTAL ULTRASONIC PROSTATE (TUBP);  Surgeon: Nickie Retort, MD;  [facility]: [facility];  Service: Urology;  Laterality: N/A;  . SPACE OAR INSTILLATION N/A 10/16/2017   Procedure: SPACE OAR INSTILLATION;  Surgeon: Nickie Retort, MD;  Location: [facility];  Service: Urology;  Laterality: N/A;       Home Medications    Prior to Admission medications   Medication Sig Start Date End Date Taking? Authorizing Provider  cephALEXin (KEFLEX) 500 MG capsule Take 1 capsule (500 mg total) by mouth 4 (four) times daily for 5 days. 04/19/21 04/24/21 Yes Madysin Crisp C, PA-C  loratadine (CLARITIN) 10 MG tablet Take 1 tablet (10 mg total) by mouth daily. 04/19/21  Yes Riyansh Gerstner C, PA-C  triamcinolone cream (KENALOG) 0.1 % Apply 1 application topically 2 (two) times daily. 04/19/21  Yes Teagen Bucio C, PA-C  acetaminophen (TYLENOL) 325 MG tablet Take  650 mg by mouth every 6 (six) hours as needed.    [provider]  aspirin EC 325 MG tablet Take 325 mg by mouth as needed.    [provider]  calcium carbonate (TUMS - DOSED IN MG ELEMENTAL CALCIUM) 500 MG chewable tablet Chew 1 tablet by mouth daily as needed for heartburn. Patient not taking: Reported on 03/16/2021    [provider]  Cholecalciferol 50 MCG (2000 UT) TABS Take 2 tablets (4,000 Units total) by mouth daily. Patient taking differently: Take 2 tablets by mouth daily.  Takes 1000 units daily 08/12/19   Janith Lima, MD  mirabegron ER (MYRBETRIQ) 50 MG TB24 tablet Take 1 tablet (50 mg total) by mouth daily. 02/22/18   Bruning, Ashlyn, PA-C  omeprazole (PRILOSEC) 20 MG capsule Take 20 mg by mouth every morning.    [provider]  tamsulosin (FLOMAX) 0.4 MG CAPS capsule Take 1 capsule (0.4 mg total) by mouth daily after supper. Patient taking differently: Take by mouth 2 (two) times daily. Takes 2 pills once daily 11/07/17   Tyler Pita, MD    Family History Family History  Problem Relation Age of Onset  . COPD Other   . Heart disease Father        age 87, nonsmoker, heavy drinker  . Cancer Mother        unknown  . Colon cancer Neg Hx   . Esophageal cancer Neg Hx   . Rectal cancer Neg Hx   . Stomach cancer Neg Hx     Social History Social History   Tobacco Use  . Smoking status: Former Smoker    Packs/day: 0.25    Years: 8.00    Pack years: 2.00    Types: Cigarettes    Quit date: 11/19/1968    Years since quitting: 52.4  . Smokeless tobacco: Never Used  Vaping Use  . Vaping Use: Never used  Substance Use Topics  . Alcohol use: Not Currently  . Drug use: No     Allergies   Lipitor [atorvastatin]   Review of Systems Review of Systems  Constitutional: Negative for fatigue and fever.  Eyes: Negative for redness, itching and visual disturbance.  Respiratory: Negative for shortness of breath.   Cardiovascular: Negative for chest pain and leg swelling.  Gastrointestinal: Negative for nausea and vomiting.  Musculoskeletal: Positive for joint swelling. Negative for arthralgias and myalgias.  Skin: Positive for color change. Negative for rash and wound.  Neurological: Negative for dizziness, syncope, weakness, light-headedness and headaches.     Physical Exam Triage Vital Signs ED Triage Vitals  Enc Vitals Group     BP      Pulse      Resp      Temp      Temp src      SpO2      Weight      Height      Head  Circumference      Peak Flow      Pain Score      Pain Loc      Pain Edu?      Excl. in [place]?    No data found.  Updated Vital Signs BP 126/80 (BP Location: Left Arm)   Pulse (!) 58   Temp 98.1 F (36.7 C) (Oral)   Resp 18   SpO2 97%   Visual Acuity Right Eye Distance:   Left Eye Distance:   Bilateral Distance:    Right Eye  Near:   Left Eye Near:    Bilateral Near:     Physical Exam Vitals and nursing note reviewed.  Constitutional:      Appearance: He is well-developed.     Comments: No acute distress  HENT:     Head: Normocephalic and atraumatic.     Nose: Nose normal.  Eyes:     Conjunctiva/sclera: Conjunctivae normal.  Cardiovascular:     Rate and Rhythm: Normal rate.  Pulmonary:     Effort: Pulmonary effort is normal. No respiratory distress.  Abdominal:     General: There is no distension.  Musculoskeletal:        General: Normal range of motion.     Cervical back: Neck supple.     Comments: Right forearm with erythema and warmth noted to distal portion extending slightly into hand, radial pulse 2+, full active range of motion of all 5 fingers at MCP joint DIP and PIP, nontender to palpation throughout all 5 metacarpals, distal radius and ulna  Skin:    General: Skin is warm and dry.  Neurological:     Mental Status: He is alert and oriented to person, place, and time.      UC Treatments / Results  Labs (all labs ordered are listed, but only abnormal results are displayed) Labs Reviewed - No data to display  EKG   Radiology No results found.  Procedures Procedures (including critical care time)  Medications Ordered in UC Medications - No data to display  Initial Impression / Assessment and Plan / UC Course  I have reviewed the triage vital signs and the nursing notes.  Pertinent labs & imaging results that were available during my care of the patient were reviewed by me and considered in my medical decision making (see chart for  details).     Insect bite with likely localized reaction given associated itching, but given spreading redness and swelling will also cover for infection with Keflex, continue antihistamines, triamcinolone topically ice and close monitoring.  Discussed strict return precautions. Patient verbalized understanding and is agreeable with plan.  Final Clinical Impressions(s) / UC Diagnoses   Final diagnoses:  Insect bite of right forearm with local reaction, initial encounter     Discharge Instructions     Continue antihistamines-daily cetirizine/Zyrtec or loratadine/Claritin in the morning, Benadryl in the evening Use triamcinolone cream twice daily to area Continue to ice Keflex 4 times daily x5's to cover infection Elevate arm Follow-up if not improving or worsening    ED Prescriptions    Medication Sig Dispense Auth. Provider   triamcinolone cream (KENALOG) 0.1 % Apply 1 application topically 2 (two) times daily. 45 g Jayel Scaduto C, PA-C   cephALEXin (KEFLEX) 500 MG capsule Take 1 capsule (500 mg total) by mouth 4 (four) times daily for 5 days. 20 capsule Orion Vandervort C, PA-C   loratadine (CLARITIN) 10 MG tablet Take 1 tablet (10 mg total) by mouth daily. 10 tablet Geralyn Figiel, [place] C, PA-C     PDMP not reviewed this encounter.   Janith Lima, [place] 04/19/21 [phone]

## 2021-04-19 NOTE — Discharge Instructions (Addendum)
Continue antihistamines-daily cetirizine/Zyrtec or loratadine/Claritin in the morning, Benadryl in the evening Use triamcinolone cream twice daily to area Continue to ice Keflex 4 times daily x5's to cover infection Elevate arm Follow-up if not improving or worsening

## 2021-04-19 NOTE — ED Triage Notes (Signed)
Pt here for insect bite to right arm with redness and swelling

## 2021-05-11 DIAGNOSIS — C61 Malignant neoplasm of prostate: Secondary | ICD-10-CM | POA: Diagnosis not present

## 2021-05-11 LAB — PSA: PSA: 0.21

## 2021-05-12 ENCOUNTER — Encounter: Payer: Self-pay | Admitting: Emergency Medicine

## 2021-05-12 ENCOUNTER — Other Ambulatory Visit: Payer: Self-pay

## 2021-05-12 ENCOUNTER — Ambulatory Visit
Admission: EM | Admit: 2021-05-12 | Discharge: 2021-05-12 | Disposition: A | Discharge status: Home / Self Care | Payer: Medicare HMO | Attending: Emergency Medicine | Admitting: Emergency Medicine

## 2021-05-12 DIAGNOSIS — Z20822 Contact with and (suspected) exposure to covid-19: Secondary | ICD-10-CM | POA: Diagnosis not present

## 2021-05-12 DIAGNOSIS — U071 COVID-19: Secondary | ICD-10-CM

## 2021-05-12 NOTE — ED Provider Notes (Signed)
[facility]    CSN: [id number] Arrival date & time: 05/12/21  0803      History   Chief Complaint Chief Complaint  Patient presents with   Nasal Congestion   Fatigue   Sore Throat    HPI Dan Dudley is a 75 y.o. male history of GERD, hypertension, hyperlipidemia, prostate cancer, presenting today for evaluation of URI symptoms in setting of COVID.  Wife positive for COVID earlier in the week.  Began last night with nasal congestion drainage/throat irritation.  Denies any known fevers.  Denies chest pain or shortness of breath.  HPI  Past Medical History:  Diagnosis Date   GERD (gastroesophageal reflux disease)    Headache(784.0)    Heart murmur    "years ago"- 1990s   Hemorrhoids    internal/ external   Hyperlipidemia    per pt watching diet   Hypertension per pt thinks its white coat because at home normal bp   per pt currently no medication as trial w/ pcp ok (was taking micardis 40mg )   Nocturia more than twice per night    Nodular hyperplasia of prostate gland    Prostate cancer [facility]) urologist-  dr Pilar Jarvis  oncologist-  dr Tammi Klippel   dx 07-15-2017 (bx)-- Stage T1c, Gleason 4+3, PSA 9.06, vol 80.2cc-- planned radiationtherpy    Patient Active Problem List   Diagnosis Date Noted   Need for shingles vaccine 08/12/2019   Vitamin D deficiency 08/12/2019   Nevus of face 05/02/2017   Routine general medical examination at a health care facility 04/24/2016   Hearing loss 04/23/2016   Essential hypertension 04/23/2016   GERD (gastroesophageal reflux disease) 08/13/2014   PTSD (post-traumatic stress disorder) 05/09/2011   Stage 1 adenocarcinoma of prostate ([place]) 04/03/2010   BURSITIS, LEFT SHOULDER 01/31/2010   Hyperlipidemia with target LDL less than 130 07/22/2007    Past Surgical History:  Procedure Laterality Date   COLONOSCOPY  last one 08-15-2010   GOLD SEED IMPLANT N/A 10/16/2017   Procedure: GOLD SEED IMPLANT;  Surgeon: Nickie Retort, MD;  Location: [facility];  Service: Urology;  Laterality: N/A;   HEMANGIOMA EXCISION  09/25/2004   elliptical excision left lower lip   INGUINAL HERNIA REPAIR Bilateral left 10-25-2000  dr Hassell Done  right [place] 07/15/2017   Procedure: BIOPSY TRANSRECTAL ULTRASONIC PROSTATE (TUBP);  Surgeon: Nickie Retort, MD;  Location: [facility];  Service: Urology;  Laterality: N/A;   SPACE OAR INSTILLATION N/A 10/16/2017   Procedure: SPACE OAR INSTILLATION;  Surgeon: Nickie Retort, MD;  Location: [facility];  Service: Urology;  Laterality: N/A;       Home Medications    Prior to Admission medications   Medication Sig Start Date End Date Taking? Authorizing Provider  acetaminophen (TYLENOL) 325 MG tablet Take 650 mg by mouth every 6 (six) hours as needed.   Yes [provider]  aspirin EC 325 MG tablet Take 325 mg by mouth as needed.   Yes [provider]  calcium carbonate (TUMS - DOSED IN MG ELEMENTAL CALCIUM) 500 MG chewable tablet Chew 1 tablet by mouth daily as needed for heartburn.   Yes [provider]  mirabegron ER (MYRBETRIQ) 50 MG TB24 tablet Take 1 tablet (50 mg total) by mouth daily. 02/22/18  Yes Bruning, Ashlyn, PA-C  omeprazole (PRILOSEC) 20 MG capsule Take 20 mg by mouth every morning.   Yes [provider]  tamsulosin (FLOMAX) 0.4 MG CAPS capsule Take 1 capsule (0.4 mg total) by mouth daily after supper. Patient taking differently: Take by mouth 2 (two) times daily. Takes 2 pills once daily 11/07/17  Yes Tyler Pita, MD  triamcinolone cream (KENALOG) 0.1 % Apply 1 application topically 2 (two) times daily. 04/19/21   Mignon Bechler, Elesa Hacker, PA-C    Family History Family History  Problem Relation Age of Onset   COPD Other    Heart disease Father        age 64, nonsmoker, heavy drinker   Cancer Mother        unknown   Colon cancer Neg Hx     Esophageal cancer Neg Hx    Rectal cancer Neg Hx    Stomach cancer Neg Hx     Social History Social History   Tobacco Use   Smoking status: Former    Packs/day: 0.25    Years: 8.00    Pack years: 2.00    Types: Cigarettes    Quit date: 11/19/1968    Years since quitting: 52.5   Smokeless tobacco: Never  Vaping Use   Vaping Use: Never used  Substance Use Topics   Alcohol use: Not Currently   Drug use: No     Allergies   Lipitor [atorvastatin]   Review of Systems Review of Systems  Constitutional:  Positive for fatigue. Negative for activity change, appetite change, chills and fever.  HENT:  Positive for congestion, rhinorrhea and sore throat. Negative for ear pain, sinus pressure and trouble swallowing.   Eyes:  Negative for discharge and redness.  Respiratory:  Negative for cough, chest tightness and shortness of breath.   Cardiovascular:  Negative for chest pain.  Gastrointestinal:  Negative for abdominal pain, diarrhea, nausea and vomiting.  Musculoskeletal:  Negative for myalgias.  Skin:  Negative for rash.  Neurological:  Negative for dizziness, light-headedness and headaches.    Physical Exam Triage Vital Signs ED Triage Vitals  Enc Vitals Group     BP      Pulse      Resp      Temp      Temp src      SpO2      Weight      Height      Head Circumference      Peak Flow      Pain Score      Pain Loc      Pain Edu?      Excl. in [place]?    No data found.  Updated Vital Signs BP (!) 152/89 (BP Location: Left Arm)   Pulse 97   Temp 98.6 F (37 C) (Oral)   Resp 17   SpO2 93%   Visual Acuity Right Eye Distance:   Left Eye Distance:   Bilateral Distance:    Right Eye Near:   Left Eye Near:    Bilateral Near:     Physical Exam Vitals and nursing note reviewed.  Constitutional:      Appearance: He is well-developed.     Comments: No acute distress  HENT:     Head: Normocephalic and atraumatic.     Ears:     Comments: Bilateral ears without  tenderness to palpation of external auricle, tragus and mastoid, EAC's without erythema or swelling, TM's with good bony landmarks and cone of light. Non erythematous.      Nose: Nose normal.     Mouth/Throat:     Comments: Oral mucosa  pink and moist, no tonsillar enlargement or exudate. Posterior pharynx patent and nonerythematous, no uvula deviation or swelling. Normal phonation.  Eyes:     Conjunctiva/sclera: Conjunctivae normal.  Cardiovascular:     Rate and Rhythm: Normal rate and regular rhythm.  Pulmonary:     Effort: Pulmonary effort is normal. No respiratory distress.     Comments: Breathing comfortably at rest, CTABL, no wheezing, rales or other adventitious sounds auscultated  O2 rechecked and was 96/97%  Abdominal:     General: There is no distension.  Musculoskeletal:        General: Normal range of motion.     Cervical back: Neck supple.  Skin:    General: Skin is warm and dry.  Neurological:     Mental Status: He is alert and oriented to person, place, and time.     UC Treatments / Results  Labs (all labs ordered are listed, but only abnormal results are displayed) Labs Reviewed  NOVEL CORONAVIRUS, NAA  BASIC METABOLIC PANEL    EKG   Radiology No results found.  Procedures Procedures (including critical care time)  Medications Ordered in UC Medications - No data to display  Initial Impression / Assessment and Plan / UC Course  I have reviewed the triage vital signs and the nursing notes.  Pertinent labs & imaging results that were available during my care of the patient were reviewed by me and considered in my medical decision making (see chart for details).     Suspected COVID-19 infection-at home positive, wife positive and seen by myself earlier in the week.  Checking BMP to check kidney function to prescribe antivirals.  Rest and fluids Symptomatic and supportive care for symptoms.  Lungs clear to auscultation with stable O2 at this  time.  Discussed strict return precautions. Patient verbalized understanding and is agreeable with plan.  Final Clinical Impressions(s) / UC Diagnoses   Final diagnoses:  [id number]     Discharge Instructions      Blood work pending, I will send over antivirals if blood work normal Rest and fluids Tylenol as needed Follow-up for any concerns     ED Prescriptions   None    PDMP not reviewed this encounter.   Jamaul Heist, [place] C, PA-C 05/12/21 1018

## 2021-05-12 NOTE — ED Triage Notes (Signed)
Patient c/o nasal congestion, fatigue, and sore throat that started last night.   Patient had a temperature of 99.4 F this morning.   Patient endorses having a positive at home COVID test yesterday.   Patient states " my throat feels like there's something in there".   Patient denies cough.   Patient took Tylenol this morning.

## 2021-05-12 NOTE — Discharge Instructions (Addendum)
Blood work pending, I will send over antivirals if blood work normal Rest and fluids Tylenol as needed Follow-up for any concerns

## 2021-05-13 ENCOUNTER — Telehealth: Payer: Self-pay | Admitting: Emergency Medicine

## 2021-05-13 LAB — SARS-COV-2, NAA 2 DAY TAT

## 2021-05-13 LAB — BASIC METABOLIC PANEL
BUN/Creatinine Ratio: 16 (ref 10–24)
BUN: 12 mg/dL (ref 8–27)
CO2: 22 mmol/L (ref 20–29)
Calcium: 9.8 mg/dL (ref 8.6–10.2)
Chloride: 106 mmol/L (ref 96–106)
Creatinine, Ser: 0.76 mg/dL (ref 0.76–1.27)
Glucose: 87 mg/dL (ref 65–99)
Potassium: 4.3 mmol/L (ref 3.5–5.2)
Sodium: 142 mmol/L (ref 134–144)
eGFR: 93 mL/min/{1.73_m2} (ref >59)

## 2021-05-13 LAB — NOVEL CORONAVIRUS, NAA: SARS-CoV-2, NAA: DETECTED — AB

## 2021-05-13 MED ORDER — NIRMATRELVIR/RITONAVIR (PAXLOVID)TABLET: 3.0000 | ORAL_TABLET | Freq: Two times a day (BID) | ORAL | 0 refills | Status: AC

## 2021-05-13 MED ORDER — NIRMATRELVIR/RITONAVIR (PAXLOVID)TABLET: 3.0000 | ORAL_TABLET | Freq: Two times a day (BID) | ORAL | 0 refills | Status: DC

## 2021-05-13 NOTE — Telephone Encounter (Signed)
GFR 93, will send in Paxlovid, recommending patient to hold Flomax while taking this.

## 2021-05-13 NOTE — Telephone Encounter (Signed)
Rx printed from prior encounter, sent electronically

## 2021-07-25 DIAGNOSIS — M545 Low back pain, unspecified: Secondary | ICD-10-CM | POA: Insufficient documentation

## 2021-07-25 DIAGNOSIS — M25559 Pain in unspecified hip: Secondary | ICD-10-CM | POA: Diagnosis not present

## 2021-07-25 DIAGNOSIS — M5459 Other low back pain: Secondary | ICD-10-CM | POA: Diagnosis not present

## 2021-07-28 ENCOUNTER — Ambulatory Visit (INDEPENDENT_AMBULATORY_CARE_PROVIDER_SITE_OTHER): Payer: Medicare HMO | Admitting: *Deleted

## 2021-07-28 DIAGNOSIS — Z0000 Encounter for general adult medical examination without abnormal findings: Secondary | ICD-10-CM | POA: Diagnosis not present

## 2021-07-28 NOTE — Progress Notes (Signed)
Subjective:   Dan Dudley is a 75 y.o. male who presents for Medicare Annual/Subsequent preventive examination.  I connected with  Dan Dudley on 07/28/21 by audio enabled telemedicine application and verified that I am speaking with the correct person using two identifiers.   I discussed the limitations of evaluation and management by telemedicine. The patient expressed understanding and agreed to proceed.   Location of patient: Home Location of Provider: Office Persons participating in visit: Dan Dudley (patient) & Jari Favre, CMA  Review of Systems    Defer to PCP Cardiac Risk Factors include: none     Objective:    Today's Vitals   07/28/21 1714  PainSc: 0-No pain   There is no height or weight on file to calculate BMI.  Advanced Directives 07/28/2021 08/04/2019 06/30/2018 01/22/2018 10/16/2017 07/15/2017 05/05/2017  Does Patient Have a Medical Advance Directive? Yes Yes Yes Yes Yes Yes Yes  Type of Paramedic of [place];Living will;Out of facility DNR (pink MOST or yellow form) [place];Living will [place];Living will [place];Living will [place];Living will Living will;Healthcare Power of [place];Living will  Does patient want to make changes to medical advance directive? No - Patient declined - - - - No - Patient declined -  Copy of [place] in Chart? Yes - validated most recent copy scanned in chart (See row information) No - copy requested No - copy requested - No - copy requested No - copy requested Yes    Current Medications (verified) Outpatient Encounter Medications as of 07/28/2021  Medication Sig   acetaminophen (TYLENOL) 325 MG tablet Take 650 mg by mouth every 6 (six) hours as needed.   aspirin EC 325 MG tablet Take 325 mg by mouth as needed.   calcium carbonate (TUMS - DOSED IN MG ELEMENTAL  CALCIUM) 500 MG chewable tablet Chew 1 tablet by mouth daily as needed for heartburn.   mirabegron ER (MYRBETRIQ) 50 MG TB24 tablet Take 1 tablet (50 mg total) by mouth daily.   omeprazole (PRILOSEC) 20 MG capsule Take 20 mg by mouth every morning.   tamsulosin (FLOMAX) 0.4 MG CAPS capsule Take 1 capsule (0.4 mg total) by mouth daily after supper. (Patient taking differently: Take by mouth 2 (two) times daily. Takes 2 pills once daily)   triamcinolone cream (KENALOG) 0.1 % Apply 1 application topically 2 (two) times daily.   No facility-administered encounter medications on file as of 07/28/2021.    Allergies (verified) Lipitor [atorvastatin]   History: Past Medical History:  Diagnosis Date   GERD (gastroesophageal reflux disease)    Headache(784.0)    Heart murmur    "years ago"- 1990s   Hemorrhoids    internal/ external   Hyperlipidemia    per pt watching diet   Hypertension per pt thinks its white coat because at home normal bp   per pt currently no medication as trial w/ pcp ok (was taking micardis '40mg'$ )   Nocturia more than twice per night    Nodular hyperplasia of prostate gland    Prostate cancer [facility]) urologist-  dr budzyn/  oncologist-  dr Tammi Klippel   dx 07-15-2017 (bx)-- Stage T1c, Gleason 4+3, PSA 9.06, vol 80.2cc-- planned radiationtherpy   Past Surgical History:  Procedure Laterality Date   COLONOSCOPY  last one 08-15-2010   GOLD SEED IMPLANT N/A 10/16/2017   Procedure: GOLD SEED IMPLANT;  Surgeon: Nickie Retort, MD;  Location: [facility];  Service: Urology;  Laterality: N/A;   HEMANGIOMA EXCISION  09/25/2004   elliptical excision left lower lip   INGUINAL HERNIA REPAIR Bilateral left 10-25-2000  dr Hassell Done  right [place] 07/15/2017   Procedure: BIOPSY TRANSRECTAL ULTRASONIC PROSTATE (TUBP);  Surgeon: Nickie Retort, MD;  Location: [facility];  Service: Urology;  Laterality: N/A;   SPACE OAR  INSTILLATION N/A 10/16/2017   Procedure: SPACE OAR INSTILLATION;  Surgeon: Nickie Retort, MD;  Location: [facility];  Service: Urology;  Laterality: N/A;   Family History  Problem Relation Age of Onset   COPD Other    Heart disease Father        age 80, nonsmoker, heavy drinker   Cancer Mother        unknown   Colon cancer Neg Hx    Esophageal cancer Neg Hx    Rectal cancer Neg Hx    Stomach cancer Neg Hx    Social History   Socioeconomic History   Marital status: Married    Spouse name: Not on file   Number of children: 2   Years of education: Not on file   Highest education level: Not on file  Occupational History   Occupation: Retired    Fish farm manager: SELF-EMPLOYED  Tobacco Use   Smoking status: Former    Packs/day: 0.25    Years: 8.00    Pack years: 2.00    Types: Cigarettes    Quit date: 11/19/1968    Years since quitting: 52.7   Smokeless tobacco: Never  Vaping Use   Vaping Use: Never used  Substance and Sexual Activity   Alcohol use: Not Currently   Drug use: No   Sexual activity: Yes  Other Topics Concern   Not on file  Social History Narrative   Married [place] 2 kids. Randall Hiss lives in [place], [place] -bachelor and Radio producer; Katharine Look in [place] with a boy and girl (21 in 2015).    Missy-toy poodle (9).       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, operational of Radio broadcast assistant Strain: Low Risk    Difficulty of Paying Living Expenses: Not hard at all  Food Insecurity: No Food Insecurity   Worried About Charity fundraiser in the Last Year: Never true   Arboriculturist in the Last Year: Never true  Transportation Needs: No Transportation Needs   Lack of Transportation (Medical): No   Lack of Transportation (Non-Medical): No  Physical Activity: Insufficiently Active   Days of Exercise per Week: 3 days   Minutes of Exercise per Session: 20 min  Stress: No Stress Concern  Present   Feeling of Stress : Not at all  Social Connections: Unknown   Frequency of Communication with Friends and Family: Twice a week   Frequency of Social Gatherings with Friends and Family: Not on file   Attends Religious Services: Never   Marine scientist or Organizations: No   Attends Archivist Meetings: Never   Marital Status: Married    Tobacco Counseling Counseling given: Not Answered   Clinical Intake:  Pre-visit preparation completed: Yes  Pain : 0-10 Pain Score: 0-No pain Pain Type: Chronic pain Pain Location: Back Pain Onset: In the past 7 days Pain Frequency: Intermittent     BMI - recorded: 24.95 Nutritional  Status: BMI of 19-24  Normal Diabetes: No  How often do you need to have someone help you when you read instructions, pamphlets, or other written materials from your doctor or pharmacy?: 1 - Never  Diabetic? No  Interpreter Needed?: No      Activities of Daily Living In your present state of health, do you have any difficulty performing the following activities: 07/28/2021  Hearing? Y  Vision? N  Difficulty concentrating or making decisions? N  Walking or climbing stairs? N  Dressing or bathing? N  Doing errands, shopping? N  Preparing Food and eating ? N  Using the Toilet? N  In the past six months, have you accidently leaked urine? N  Do you have problems with loss of bowel control? N  Managing your Medications? N  Managing your Finances? N  Housekeeping or managing your Housekeeping? N  Some recent data might be hidden    Patient Care Team: Janith Lima, MD as PCP - General (Internal Medicine) Nickie Retort, MD (Inactive) (Urology)  Indicate any recent Medical Services you may have received from other than [facility] providers in the past year (date may be approximate).     Assessment:   This is a routine wellness examination for [place].  Hearing/Vision screen No results found.  Dietary issues and  exercise activities discussed: Current Exercise Habits: The patient does not participate in regular exercise at present   Goals Addressed   None    Depression Screen PHQ 2/9 Scores 07/28/2021 08/04/2019 06/30/2018 01/22/2018 08/19/2017 08/19/2017 05/05/2017  PHQ - 2 Score 0 0 1 1 0 0 0  PHQ- 9 Score - - 4 - - - -    Fall Risk Fall Risk  07/28/2021 08/04/2019 06/30/2018 01/22/2018 08/19/2017  Falls in the past year? 0 0 No No No  Number falls in past yr: 0 0 - - -  Injury with Fall? 0 0 - - -    FALL RISK PREVENTION PERTAINING TO THE HOME:  Any stairs in or around the home? Yes  If so, are there any without handrails? No  Home free of loose throw rugs in walkways, pet beds, electrical cords, etc? Yes  Adequate lighting in your home to reduce risk of falls? Yes   ASSISTIVE DEVICES UTILIZED TO PREVENT FALLS:  Life alert? No  Use of a cane, walker or w/c? No  Grab bars in the bathroom? No  Shower chair or bench in shower? Yes  Elevated toilet seat or a handicapped toilet? No   TIMED UP AND GO:  Was the test performed? No .   Cognitive Function:     6CIT Screen 07/28/2021  What Year? 0 points  What month? 0 points  What time? 0 points  Count back from 20 0 points  Months in reverse 0 points  Repeat phrase 0 points  Total Score 0    Immunizations Immunization History  Administered Date(s) Administered   Fluad Quad(high Dose 65+) 07/15/2019, 08/03/2020   Influenza Split 10/04/2011, 08/19/2012   Influenza Whole 08/09/2010   Influenza, High Dose Seasonal PF 08/07/2016, 08/13/2017, 08/13/2018   Influenza,inj,Quad PF,6+ Mos 08/14/2013, 08/13/2014, 08/17/2015   Moderna SARS-COV2 Booster Vaccination 09/13/2020, 06/04/2021   Moderna Sars-Covid-2 Vaccination 12/15/2019, 01/12/2020   Pneumococcal Conjugate-13 10/19/2013   Pneumococcal Polysaccharide-23 08/17/2015   Td 11/19/2001   Tdap 09/25/2012   Zoster Recombinat (Shingrix) 08/14/2019, 10/19/2019   Zoster, Live 05/14/2011     TDAP status: Up to date  Flu Vaccine status: Due, Education  has been provided regarding the importance of this vaccine. Advised may receive this vaccine at local pharmacy or Health Dept. Aware to provide a copy of the vaccination record if obtained from local pharmacy or Health Dept. Verbalized acceptance and understanding.  Pneumococcal vaccine status: Up to date  Covid-19 vaccine status: Completed vaccines  Qualifies for Shingles Vaccine? Yes   Zostavax completed Yes   Shingrix Completed?: Yes  Screening Tests Health Maintenance  Topic Date Due   INFLUENZA VACCINE  08/11/2021 (Originally 06/19/2021)   COVID-19 Vaccine (5 - Booster for Moderna series) 10/05/2021   TETANUS/TDAP  09/25/2022   Hepatitis C Screening  Completed   PNA vac Low Risk Adult  Completed   Zoster Vaccines- Shingrix  Completed   HPV VACCINES  Aged Out    Health Maintenance  There are no preventive care reminders to display for this patient.   Colorectal cancer screening: No longer required.   Lung Cancer Screening: (Low Dose CT Chest recommended if Age 36-80 years, 30 pack-year currently smoking OR have quit w/in 15years.) does not qualify.   Additional Screening:  Hepatitis C Screening: does qualify; Completed   Vision Screening: Recommended annual ophthalmology exams for early detection of glaucoma and other disorders of the eye. Is the patient up to date with their annual eye exam?  Yes  Who is the provider or what is the name of the office in which the patient attends annual eye exams? [facility] If pt is not established with a provider, would they like to be referred to a provider to establish care? No .   Dental Screening: Recommended annual dental exams for proper oral hygiene  Community Resource Referral / Chronic Care Management: CRR required this visit?  No   CCM required this visit?  No      Plan:     I have personally reviewed and noted the following in the patient's  chart:   Medical and social history Use of alcohol, tobacco or illicit drugs  Current medications and supplements including opioid prescriptions. Patient is not currently taking opioid prescriptions. Functional ability and status Nutritional status Physical activity Advanced directives List of other physicians Hospitalizations, surgeries, and ER visits in previous 12 months Vitals Screenings to include cognitive, depression, and falls Referrals and appointments  In addition, I have reviewed and discussed with patient certain preventive protocols, quality metrics, and best practice recommendations. A written personalized care plan for preventive services as well as general preventive health recommendations were provided to patient.     Cannon Kettle, [place]   07/28/2021   Nurse Notes: 19 minutes non face to face

## 2021-07-28 NOTE — Patient Instructions (Signed)
Health Maintenance, Male Adopting a healthy lifestyle and getting preventive care are important in promoting health and wellness. Ask your health care provider about: The right schedule for you to have regular tests and exams. Things you can do on your own to prevent diseases and keep yourself healthy. What should I know about diet, weight, and exercise? Eat a healthy diet  Eat a diet that includes plenty of vegetables, fruits, low-fat dairy products, and lean protein. Do not eat a lot of foods that are high in solid fats, added sugars, or sodium. Maintain a healthy weight Body mass index (BMI) is a measurement that can be used to identify possible weight problems. It estimates body fat based on height and weight. Your health care provider can help determine your BMI and help you achieve or maintain a healthy weight. Get regular exercise Get regular exercise. This is one of the most important things you can do for your health. Most adults should: Exercise for at least 150 minutes each week. The exercise should increase your heart rate and make you sweat (moderate-intensity exercise). Do strengthening exercises at least twice a week. This is in addition to the moderate-intensity exercise. Spend less time sitting. Even light physical activity can be beneficial. Watch cholesterol and blood lipids Have your blood tested for lipids and cholesterol at 77 years of age, then have this test every 5 years. You may need to have your cholesterol levels checked more often if: Your lipid or cholesterol levels are high. You are older than 77 years of age. You are at high risk for heart disease. What should I know about cancer screening? Many types of cancers can be detected early and may often be prevented. Depending on your health history and family history, you may need to have cancer screening at various ages. This may include screening for: Colorectal cancer. Prostate cancer. Skin cancer. Lung  cancer. What should I know about heart disease, diabetes, and high blood pressure? Blood pressure and heart disease High blood pressure causes heart disease and increases the risk of stroke. This is more likely to develop in people who have high blood pressure readings, are of African descent, or are overweight. Talk with your health care provider about your target blood pressure readings. Have your blood pressure checked: Every 3-5 years if you are 18-39 years of age. Every year if you are 40 years old or older. If you are between the ages of 65 and 75 and are a current or former smoker, ask your health care provider if you should have a one-time screening for abdominal aortic aneurysm (AAA). Diabetes Have regular diabetes screenings. This checks your fasting blood sugar level. Have the screening done: Once every three years after age 45 if you are at a normal weight and have a low risk for diabetes. More often and at a younger age if you are overweight or have a high risk for diabetes. What should I know about preventing infection? Hepatitis B If you have a higher risk for hepatitis B, you should be screened for this virus. Talk with your health care provider to find out if you are at risk for hepatitis B infection. Hepatitis C Blood testing is recommended for: Everyone born from 1945 through 1965. Anyone with known risk factors for hepatitis C. Sexually transmitted infections (STIs) You should be screened each year for STIs, including gonorrhea and chlamydia, if: You are sexually active and are younger than 77 years of age. You are older than 77 years   of age and your health care provider tells you that you are at risk for this type of infection. Your sexual activity has changed since you were last screened, and you are at increased risk for chlamydia or gonorrhea. Ask your health care provider if you are at risk. Ask your health care provider about whether you are at high risk for HIV.  Your health care provider may recommend a prescription medicine to help prevent HIV infection. If you choose to take medicine to prevent HIV, you should first get tested for HIV. You should then be tested every 3 months for as long as you are taking the medicine. Follow these instructions at home: Lifestyle Do not use any products that contain nicotine or tobacco, such as cigarettes, e-cigarettes, and chewing tobacco. If you need help quitting, ask your health care provider. Do not use street drugs. Do not share needles. Ask your health care provider for help if you need support or information about quitting drugs. Alcohol use Do not drink alcohol if your health care provider tells you not to drink. If you drink alcohol: Limit how much you have to 0-2 drinks a day. Be aware of how much alcohol is in your drink. In the [place] one drink equals one 12 oz bottle of beer (355 mL), one 5 oz glass of wine (148 mL), or one 1 oz glass of hard liquor (44 mL). General instructions Schedule regular health, dental, and eye exams. Stay current with your vaccines. Tell your health care provider if: You often feel depressed. You have ever been abused or do not feel safe at home. Summary Adopting a healthy lifestyle and getting preventive care are important in promoting health and wellness. Follow your health care provider's instructions about healthy diet, exercising, and getting tested or screened for diseases. Follow your health care provider's instructions on monitoring your cholesterol and blood pressure. This information is not intended to replace advice given to you by your health care provider. Make sure you discuss any questions you have with your health care provider. Document Revised: 01/13/2021 Document Reviewed: 10/29/2018 Elsevier Patient Education  2022 Elsevier Inc.  

## 2021-08-07 ENCOUNTER — Other Ambulatory Visit: Payer: Self-pay

## 2021-08-07 ENCOUNTER — Ambulatory Visit (INDEPENDENT_AMBULATORY_CARE_PROVIDER_SITE_OTHER): Payer: Medicare HMO

## 2021-08-07 DIAGNOSIS — Z23 Encounter for immunization: Secondary | ICD-10-CM

## 2021-08-26 ENCOUNTER — Other Ambulatory Visit: Payer: Self-pay

## 2021-08-26 ENCOUNTER — Ambulatory Visit
Admission: RE | Admit: 2021-08-26 | Discharge: 2021-08-26 | Disposition: A | Discharge status: Home / Self Care | Payer: Medicare HMO | Source: Ambulatory Visit | Attending: Internal Medicine | Admitting: Internal Medicine

## 2021-08-26 VITALS — BP 145/84 | HR 72 | Temp 97.8°F | Resp 18 | Ht 71.0 in | Wt 165.0 lb

## 2021-08-26 DIAGNOSIS — L2389 Allergic contact dermatitis due to other agents: Secondary | ICD-10-CM

## 2021-08-26 DIAGNOSIS — L509 Urticaria, unspecified: Secondary | ICD-10-CM

## 2021-08-26 MED ORDER — PREDNISONE 20 MG PO TABS: 40.0000 mg | ORAL_TABLET | Freq: Every day | ORAL | 0 refills | Status: AC

## 2021-08-26 NOTE — Discharge Instructions (Addendum)
You have been prescribed prednisone steroid to decrease inflammation and help with rash.  Please follow-up with primary care physician or urgent care if symptoms persist.

## 2021-08-26 NOTE — ED Triage Notes (Signed)
Patient c/o rash on back and chest for several weeks.  The rash is spreading and itching.  No change in soap, lotions, detergents, foods.  Patient has taken Benadryl which does help but makes patient drowsy.

## 2021-08-26 NOTE — ED Provider Notes (Signed)
[facility]    CSN: [id number] Arrival date & time: 08/26/21  1136      History   Chief Complaint Chief Complaint  Patient presents with   Appointment   Rash    HPI Dan Dudley is a 75 y.o. male.   Patient presents with itchy rash that has been present to back and chest for approximately a few weeks.  Patient reports that he has used Benadryl, hydrocortisone cream, triamcinolone cream at home with no improvement in rash.  Denies any changes to soaps, lotions, detergents, foods, etc.  No changes to environment per patient.  Rash is not painful per patient.  No fevers.   Rash  Past Medical History:  Diagnosis Date   GERD (gastroesophageal reflux disease)    Headache(784.0)    Heart murmur    "years ago"- 1990s   Hemorrhoids    internal/ external   Hyperlipidemia    per pt watching diet   Hypertension per pt thinks its white coat because at home normal bp   per pt currently no medication as trial w/ pcp ok (was taking micardis 40mg )   Nocturia more than twice per night    Nodular hyperplasia of prostate gland    Prostate cancer [facility]) urologist-  dr Pilar Jarvis  oncologist-  dr Tammi Klippel   dx 07-15-2017 (bx)-- Stage T1c, Gleason 4+3, PSA 9.06, vol 80.2cc-- planned radiationtherpy    Patient Active Problem List   Diagnosis Date Noted   Need for shingles vaccine 08/12/2019   Vitamin D deficiency 08/12/2019   Nevus of face 05/02/2017   Routine general medical examination at a health care facility 04/24/2016   Hearing loss 04/23/2016   Essential hypertension 04/23/2016   GERD (gastroesophageal reflux disease) 08/13/2014   PTSD (post-traumatic stress disorder) 05/09/2011   Stage 1 adenocarcinoma of prostate ([place]) 04/03/2010   BURSITIS, LEFT SHOULDER 01/31/2010   Hyperlipidemia with target LDL less than 130 07/22/2007    Past Surgical History:  Procedure Laterality Date   COLONOSCOPY  last one 08-15-2010   GOLD SEED IMPLANT N/A 10/16/2017    Procedure: GOLD SEED IMPLANT;  Surgeon: Nickie Retort, MD;  Location: [facility];  Service: Urology;  Laterality: N/A;   HEMANGIOMA EXCISION  09/25/2004   elliptical excision left lower lip   INGUINAL HERNIA REPAIR Bilateral left 10-25-2000  dr Hassell Done  right [place] 07/15/2017   Procedure: BIOPSY TRANSRECTAL ULTRASONIC PROSTATE (TUBP);  Surgeon: Nickie Retort, MD;  Location: [facility];  Service: Urology;  Laterality: N/A;   SPACE OAR INSTILLATION N/A 10/16/2017   Procedure: SPACE OAR INSTILLATION;  Surgeon: Nickie Retort, MD;  Location: [facility];  Service: Urology;  Laterality: N/A;       Home Medications    Prior to Admission medications   Medication Sig Start Date End Date Taking? Authorizing Provider  acetaminophen (TYLENOL) 325 MG tablet Take 650 mg by mouth every 6 (six) hours as needed.   Yes [provider]  aspirin EC 325 MG tablet Take 325 mg by mouth as needed.   Yes [provider]  calcium carbonate (TUMS - DOSED IN MG ELEMENTAL CALCIUM) 500 MG chewable tablet Chew 1 tablet by mouth daily as needed for heartburn.   Yes [provider]  mirabegron ER (MYRBETRIQ) 50 MG TB24 tablet Take 1 tablet (50 mg total) by mouth daily. 02/22/18  Yes Bruning, Ashlyn, PA-C  omeprazole (PRILOSEC) 20 MG capsule  Take 20 mg by mouth every morning.   Yes [provider]  predniSONE (DELTASONE) 20 MG tablet Take 2 tablets (40 mg total) by mouth daily for 5 days. 08/26/21 08/31/21 Yes Odis Luster, FNP  tamsulosin (FLOMAX) 0.4 MG CAPS capsule Take 1 capsule (0.4 mg total) by mouth daily after supper. Patient taking differently: Take by mouth 2 (two) times daily. Takes 2 pills once daily 11/07/17  Yes Tyler Pita, MD  triamcinolone cream (KENALOG) 0.1 % Apply 1 application topically 2 (two) times daily. 04/19/21  Yes Wieters, Elesa Hacker, PA-C    Family History Family  History  Problem Relation Age of Onset   COPD Other    Heart disease Father        age 41, nonsmoker, heavy drinker   Cancer Mother        unknown   Colon cancer Neg Hx    Esophageal cancer Neg Hx    Rectal cancer Neg Hx    Stomach cancer Neg Hx     Social History Social History   Tobacco Use   Smoking status: Former    Packs/day: 0.25    Years: 8.00    Pack years: 2.00    Types: Cigarettes    Quit date: 11/19/1968    Years since quitting: 52.8   Smokeless tobacco: Never  Vaping Use   Vaping Use: Never used  Substance Use Topics   Alcohol use: Not Currently   Drug use: No     Allergies   Lipitor [atorvastatin]   Review of Systems Review of Systems Per HPI  Physical Exam Triage Vital Signs ED Triage Vitals  Enc Vitals Group     BP 08/26/21 1202 (!) 145/84     Pulse Rate 08/26/21 1202 72     Resp 08/26/21 1202 18     Temp 08/26/21 1202 97.8 F (36.6 C)     Temp Source 08/26/21 1202 Oral     SpO2 08/26/21 1202 93 %     Weight 08/26/21 1204 165 lb (74.8 kg)     Height 08/26/21 1204 5\' 11"  (1.803 m)     Head Circumference --      Peak Flow --      Pain Score 08/26/21 1204 0     Pain Loc --      Pain Edu? --      Excl. in [place]? --    No data found.  Updated Vital Signs BP (!) 145/84 (BP Location: Left Arm)   Pulse 72   Temp 97.8 F (36.6 C) (Oral)   Resp 18   Ht 5\' 11"  (1.803 m)   Wt 165 lb (74.8 kg)   SpO2 93%   BMI 23.01 kg/m   Visual Acuity Right Eye Distance:   Left Eye Distance:   Bilateral Distance:    Right Eye Near:   Left Eye Near:    Bilateral Near:     Physical Exam Constitutional:      General: He is not in acute distress.    Appearance: Normal appearance. He is not toxic-appearing or diaphoretic.  HENT:     Head: Normocephalic and atraumatic.  Eyes:     Extraocular Movements: Extraocular movements intact.     Conjunctiva/sclera: Conjunctivae normal.  Pulmonary:     Effort: Pulmonary effort is normal.  Skin:    General:  Skin is warm and dry.     Findings: Rash present. Rash is urticarial.     Comments: Maculopapular rash present to  bilateral upper chest close to clavicle.  Also has same rash present to bilateral lower lumbar regions.  No tracking noted.  No signs of infection.  Neurological:     General: No focal deficit present.     Mental Status: He is alert and oriented to person, place, and time. Mental status is at baseline.  Psychiatric:        Mood and Affect: Mood normal.        Behavior: Behavior normal.        Thought Content: Thought content normal.        Judgment: Judgment normal.     UC Treatments / Results  Labs (all labs ordered are listed, but only abnormal results are displayed) Labs Reviewed - No data to display  EKG   Radiology No results found.  Procedures Procedures (including critical care time)  Medications Ordered in UC Medications - No data to display  Initial Impression / Assessment and Plan / UC Course  I have reviewed the triage vital signs and the nursing notes.  Pertinent labs & imaging results that were available during my care of the patient were reviewed by me and considered in my medical decision making (see chart for details).     Patient appears to be experiencing some type of contact dermatitis.  Will treat with prednisone steroid x5 days due to rash being refractory to over-the-counter antihistamines as well as steroid creams.  Patient reports that he has tolerated prednisone steroids well in the past.  No contraindications for steroids as well.  Patient to follow-up with PCP or urgent care if rash persists.  No red flags seen on exam.Discussed strict return precautions. Patient verbalized understanding and is agreeable with plan.  Final Clinical Impressions(s) / UC Diagnoses   Final diagnoses:  Allergic contact dermatitis due to other agents  Urticaria     Discharge Instructions      You have been prescribed prednisone steroid to decrease  inflammation and help with rash.  Please follow-up with primary care physician or urgent care if symptoms persist.     ED Prescriptions     Medication Sig Dispense Auth. Provider   predniSONE (DELTASONE) 20 MG tablet Take 2 tablets (40 mg total) by mouth daily for 5 days. 10 tablet Odis Luster, FNP      PDMP not reviewed this encounter.   Odis Luster, [place] 08/26/21 1244

## 2021-09-15 DIAGNOSIS — M5459 Other low back pain: Secondary | ICD-10-CM | POA: Diagnosis not present

## 2021-09-22 ENCOUNTER — Ambulatory Visit: Payer: Medicare HMO | Attending: Internal Medicine

## 2021-09-22 ENCOUNTER — Other Ambulatory Visit (HOSPITAL_BASED_OUTPATIENT_CLINIC_OR_DEPARTMENT_OTHER): Payer: Self-pay

## 2021-09-22 ENCOUNTER — Other Ambulatory Visit: Payer: Self-pay

## 2021-09-22 DIAGNOSIS — Z23 Encounter for immunization: Secondary | ICD-10-CM

## 2021-09-22 MED ORDER — MODERNA COVID-19 BIVAL BOOSTER 50 MCG/0.5ML IM SUSP
INTRAMUSCULAR | 0 refills | Status: DC
  Filled 2021-09-22: qty 0.5, 1d supply, fill #0

## 2021-09-22 NOTE — Progress Notes (Signed)
   Covid-19 Vaccination Clinic  Name:  MARKHI KLECKNER    MRN: [id number] DOB: 01-Dec-1945  09/22/2021  Mr. Ayotte was observed post Covid-19 immunization for 15 minutes without incident. He was provided with Vaccine Information Sheet and instruction to access the V-Safe system.   Mr. Proudfoot was instructed to call 911 with any severe reactions post vaccine: Difficulty breathing  Swelling of face and throat  A fast heartbeat  A bad rash all over body  Dizziness and weakness   Immunizations Administered     Name Date Dose VIS Date Route   Moderna Covid-19 vaccine Bivalent Booster 09/22/2021  9:59 AM 0.5 mL 07/01/2021 Intramuscular   Manufacturer: Moderna   Lot: [id number]   [place]: [id number]

## 2021-10-03 ENCOUNTER — Other Ambulatory Visit: Payer: Self-pay

## 2021-10-03 ENCOUNTER — Ambulatory Visit (INDEPENDENT_AMBULATORY_CARE_PROVIDER_SITE_OTHER): Payer: Medicare HMO | Admitting: Internal Medicine

## 2021-10-03 ENCOUNTER — Encounter: Payer: Self-pay | Admitting: Internal Medicine

## 2021-10-03 ENCOUNTER — Ambulatory Visit (INDEPENDENT_AMBULATORY_CARE_PROVIDER_SITE_OTHER): Payer: Medicare HMO

## 2021-10-03 VITALS — BP 122/78 | HR 101 | Temp 98.3°F | Ht 71.0 in | Wt 169.0 lb

## 2021-10-03 DIAGNOSIS — R634 Abnormal weight loss: Secondary | ICD-10-CM | POA: Insufficient documentation

## 2021-10-03 DIAGNOSIS — U071 COVID-19: Secondary | ICD-10-CM | POA: Diagnosis not present

## 2021-10-03 DIAGNOSIS — R052 Subacute cough: Secondary | ICD-10-CM | POA: Diagnosis not present

## 2021-10-03 DIAGNOSIS — J208 Acute bronchitis due to other specified organisms: Secondary | ICD-10-CM | POA: Diagnosis not present

## 2021-10-03 DIAGNOSIS — I10 Essential (primary) hypertension: Secondary | ICD-10-CM | POA: Diagnosis not present

## 2021-10-03 DIAGNOSIS — J370 Chronic laryngitis: Secondary | ICD-10-CM | POA: Diagnosis not present

## 2021-10-03 DIAGNOSIS — R059 Cough, unspecified: Secondary | ICD-10-CM | POA: Diagnosis not present

## 2021-10-03 DIAGNOSIS — E785 Hyperlipidemia, unspecified: Secondary | ICD-10-CM | POA: Diagnosis not present

## 2021-10-03 DIAGNOSIS — J189 Pneumonia, unspecified organism: Secondary | ICD-10-CM | POA: Insufficient documentation

## 2021-10-03 DIAGNOSIS — Z0001 Encounter for general adult medical examination with abnormal findings: Secondary | ICD-10-CM | POA: Diagnosis not present

## 2021-10-03 LAB — CBC WITH DIFFERENTIAL/PLATELET
Basophils Absolute: 0.1 10*3/uL (ref 0.0–0.1)
Basophils Relative: 0.6 % (ref 0.0–3.0)
Eosinophils Absolute: 0 10*3/uL (ref 0.0–0.7)
Eosinophils Relative: 0.3 % (ref 0.0–5.0)
HCT: 45.8 % (ref 39.0–52.0)
Hemoglobin: 15.3 g/dL (ref 13.0–17.0)
Lymphocytes Relative: 6.1 % — ABNORMAL LOW (ref 12.0–46.0)
Lymphs Abs: 0.5 10*3/uL — ABNORMAL LOW (ref 0.7–4.0)
MCHC: 33.4 g/dL (ref 30.0–36.0)
MCV: 93 fl (ref 78.0–100.0)
Monocytes Absolute: 0.5 10*3/uL (ref 0.1–1.0)
Monocytes Relative: 5.4 % (ref 3.0–12.0)
Neutro Abs: 7.6 10*3/uL (ref 1.4–7.7)
Neutrophils Relative %: 87.6 % — ABNORMAL HIGH (ref 43.0–77.0)
Platelets: 341 10*3/uL (ref 150.0–400.0)
RBC: 4.93 Mil/uL (ref 4.22–5.81)
RDW: 14.8 % (ref 11.5–15.5)
WBC: 8.7 10*3/uL (ref 4.0–10.5)

## 2021-10-03 LAB — HEPATIC FUNCTION PANEL
ALT: 94 U/L — ABNORMAL HIGH (ref 0–53)
AST: 45 U/L — ABNORMAL HIGH (ref 0–37)
Albumin: 4.1 g/dL (ref 3.5–5.2)
Alkaline Phosphatase: 115 U/L (ref 39–117)
Bilirubin, Direct: 0.1 mg/dL (ref 0.0–0.3)
Total Bilirubin: 0.8 mg/dL (ref 0.2–1.2)
Total Protein: 7.2 g/dL (ref 6.0–8.3)

## 2021-10-03 LAB — SARS-COV-2 IGG: SARS-COV-2 IgG: 20.08

## 2021-10-03 LAB — LIPID PANEL
Cholesterol: 218 mg/dL — ABNORMAL HIGH (ref 0–200)
HDL: 72.5 mg/dL (ref >39.00)
LDL Cholesterol: 126 mg/dL — ABNORMAL HIGH (ref 0–99)
NonHDL: 145.35
Total CHOL/HDL Ratio: 3
Triglycerides: 95 mg/dL (ref 0.0–149.0)
VLDL: 19 mg/dL (ref 0.0–40.0)

## 2021-10-03 LAB — BASIC METABOLIC PANEL
BUN: 18 mg/dL (ref 6–23)
CO2: 30 mEq/L (ref 19–32)
Calcium: 9.6 mg/dL (ref 8.4–10.5)
Chloride: 99 mEq/L (ref 96–112)
Creatinine, Ser: 0.86 mg/dL (ref 0.40–1.50)
GFR: 83.84 mL/min (ref >60.00)
Glucose, Bld: 111 mg/dL — ABNORMAL HIGH (ref 70–99)
Potassium: 4.3 mEq/L (ref 3.5–5.1)
Sodium: 137 mEq/L (ref 135–145)

## 2021-10-03 LAB — URINALYSIS, ROUTINE W REFLEX MICROSCOPIC
Bilirubin Urine: NEGATIVE
Hgb urine dipstick: NEGATIVE
Ketones, ur: NEGATIVE
Leukocytes,Ua: NEGATIVE
Nitrite: NEGATIVE
RBC / HPF: NONE SEEN (ref 0–?)
Specific Gravity, Urine: <=1.005 — AB (ref 1.000–1.030)
Total Protein, Urine: NEGATIVE
Urine Glucose: NEGATIVE
Urobilinogen, UA: 0.2 (ref 0.0–1.0)
pH: 6 (ref 5.0–8.0)

## 2021-10-03 MED ORDER — MOXIFLOXACIN HCL 400 MG PO TABS: 400.0000 mg | ORAL_TABLET | Freq: Every day | ORAL | 0 refills | Status: AC

## 2021-10-03 NOTE — Patient Instructions (Signed)
Community-Acquired Pneumonia, Adult °Pneumonia is an infection of the lungs. It causes irritation and swelling in the airways of the lungs. Mucus and fluid may also build up inside the airways. This may cause coughing and trouble breathing. °One type of pneumonia can happen while you are in a hospital. A different type can happen when you are not in a hospital (community-acquired pneumonia). °What are the causes? °This condition is caused by germs (viruses, bacteria, or fungi). Some types of germs can spread from person to person. Pneumonia is not thought to spread from person to person. °What increases the risk? °You are more likely to develop this condition if: °You have a long-term (chronic) disease, such as: °Disease of the lungs. This may be chronic obstructive pulmonary disease (COPD) or asthma. °Heart failure. °Cystic fibrosis. °Diabetes. °Kidney disease. °Sickle cell disease. °HIV. °You have other health problems, such as: °Your body's defense system (immune system) is weak. °A condition that may cause you to breathe in fluids from your mouth and nose. °You had your spleen taken out. °You do not take good care of your teeth and mouth (poor dental hygiene). °You use or have used tobacco products. °You travel where the germs that cause this illness are common. °You are near certain animals or the places they live. °You are older than 77 years of age. °What are the signs or symptoms? °Symptoms of this condition include: °A cough. °A fever. °Sweating or chills. °Chest pain, often when you breathe deeply or cough. °Breathing problems, such as: °Fast breathing. °Trouble breathing. °Shortness of breath. °Feeling tired (fatigued). °Muscle aches. °How is this treated? °Treatment for this condition depends on many things, such as: °The cause of your illness. °Your medicines. °Your other health problems. °Most adults can be treated at home. Sometimes, treatment must happen in a hospital. °Treatment may include  medicines to kill germs. °Medicines may depend on which germ caused your illness. °Very bad pneumonia is rare. If you get it, you may: °Have a machine to help you breathe. °Have fluid taken away from around your lungs. °Follow these instructions at home: °Medicines °Take over-the-counter and prescription medicines only as told by your doctor. °Take cough medicine only if you are losing sleep. Cough medicine can keep your body from taking mucus away from your lungs. °If you were prescribed an antibiotic medicine, take it as told by your doctor. Do not stop taking the antibiotic even if you start to feel better. °Lifestyle °  °Do not drink alcohol. °Do not use any products that contain nicotine or tobacco, such as cigarettes, e-cigarettes, and chewing tobacco. If you need help quitting, ask your doctor. °Eat a healthy diet. This includes a lot of vegetables, fruits, whole grains, low-fat dairy products, and low-fat (lean) protein. °General instructions ° °Rest a lot. Sleep for at least 8 hours each night. °Sleep with your head and neck raised. Put a few pillows under your head or sleep in a reclining chair. °Return to your normal activities as told by your doctor. Ask your doctor what activities are safe for you. °Drink enough fluid to keep your pee (urine) pale yellow. °If your throat is sore, rinse your mouth often with salt water. To make salt water, dissolve ½-1 tsp (3-6 g) of salt in 1 cup (237 mL) of warm water. °Keep all follow-up visits as told by your doctor. This is important. °How is this prevented? °You can lower your risk of pneumonia by: °Getting the pneumonia shot (vaccine). These shots have different   types and schedules. Ask your doctor what works best for you. Think about getting this shot if: °You are older than 77 years of age. °You are 19-65 years of age and: °You are being treated for cancer. °You have long-term lung disease. °You have other problems that affect your body's defense system. Ask  your doctor if you have one of these. °Getting your flu shot every year. Ask your doctor which type of shot is best for you. °Going to the dentist as often as told. °Washing your hands often with soap and water for at least 20 seconds. If you cannot use soap and water, use hand sanitizer. °Contact a doctor if: °You have a fever. °You lose sleep because your cough medicine does not help. °Get help right away if: °You are short of breath and this gets worse. °You have more chest pain. °Your sickness gets worse. This is very serious if: °You are an older adult. °Your body's defense system is weak. °You cough up blood. °These symptoms may be an emergency. Do not wait to see if the symptoms will go away. Get medical help right away. Call your local emergency services (911 in the [place] Do not drive yourself to the hospital. °Summary °Pneumonia is an infection of the lungs. °Community-acquired pneumonia affects people who have not been in the hospital. Certain germs can cause this infection. °This condition may be treated with medicines that kill germs. °For very bad pneumonia, you may need a hospital stay and treatment to help with breathing. °This information is not intended to replace advice given to you by your health care provider. Make sure you discuss any questions you have with your health care provider. °Document Revised: 08/18/2019 Document Reviewed: 08/18/2019 °Elsevier Patient Education © 2022 Elsevier Inc. ° °

## 2021-10-03 NOTE — Progress Notes (Addendum)
Z00.01  Subjective:  Patient ID: Dan Dudley, male    DOB: Sep 23, 1946  Age: 75 y.o. MRN: [id number]  CC: Annual Exam and Cough  This visit occurred during the SARS-CoV-2 public health emergency.  Safety protocols were in place, including screening questions prior to the visit, additional usage of staff PPE, and extensive cleaning of exam room while observing appropriate contact time as indicated for disinfecting solutions.    HPI Dan Dudley presents for a CPX and f/up -   He complains of a 1 week history of NP cough with low-grade fever.  He denies chest pain, night sweats, hemoptysis, rigors, or sore throat.  He also complains of a 1 year history of laryngitis, a sensation that something is stuck in his throat, weight loss, fatigue, and nocturnal leg cramps.  Outpatient Medications Prior to Visit  Medication Sig Dispense Refill   acetaminophen (TYLENOL) 325 MG tablet Take 650 mg by mouth every 6 (six) hours as needed.     aspirin EC 325 MG tablet Take 325 mg by mouth as needed.     calcium carbonate (TUMS - DOSED IN MG ELEMENTAL CALCIUM) 500 MG chewable tablet Chew 1 tablet by mouth daily as needed for heartburn.     mirabegron ER (MYRBETRIQ) 50 MG TB24 tablet Take 1 tablet (50 mg total) by mouth daily. 30 tablet 4   omeprazole (PRILOSEC) 20 MG capsule Take 20 mg by mouth every morning.     tamsulosin (FLOMAX) 0.4 MG CAPS capsule Take 1 capsule (0.4 mg total) by mouth daily after supper. (Patient taking differently: Take by mouth 2 (two) times daily. Takes 2 pills once daily) 30 capsule 5   triamcinolone cream (KENALOG) 0.1 % Apply 1 application topically 2 (two) times daily. 45 g 0   COVID-19 mRNA bivalent vaccine, Moderna, (MODERNA COVID-19 BIVAL BOOSTER) 50 MCG/0.5ML injection Inject into the muscle. 0.5 mL 0   No facility-administered medications prior to visit.    ROS Review of Systems  Constitutional:  Positive for fatigue, fever and unexpected weight change.  Negative for appetite change, chills and diaphoresis.  HENT:  Positive for voice change. Negative for sore throat and trouble swallowing.   Eyes: Negative.   Respiratory:  Positive for cough. Negative for chest tightness, shortness of breath and wheezing.   Cardiovascular:  Negative for chest pain, palpitations and leg swelling.  Gastrointestinal:  Negative for abdominal pain, constipation, diarrhea, nausea and vomiting.  Endocrine: Negative.   Genitourinary: Negative.  Negative for difficulty urinating, dysuria and hematuria.  Musculoskeletal:  Positive for back pain and myalgias. Negative for arthralgias and neck pain.  Skin:  Negative for color change and rash.  Neurological: Negative.  Negative for dizziness, weakness and light-headedness.  Hematological:  Negative for adenopathy. Does not bruise/bleed easily.  Psychiatric/Behavioral: Negative.     Objective:  BP 122/78 (BP Location: Right Arm, Patient Position: Sitting, Cuff Size: Large)   Pulse (!) 101   Temp 98.3 F (36.8 C) (Oral)   Ht 5\' 11"  (9.373 m)   Wt 169 lb (76.7 kg)   SpO2 98%   BMI 23.57 kg/m   BP Readings from Last 3 Encounters:  10/03/21 122/78  08/26/21 (!) 145/84  05/12/21 (!) 152/89    Wt Readings from Last 3 Encounters:  10/03/21 169 lb (76.7 kg)  08/26/21 165 lb (74.8 kg)  03/16/21 179 lb 6.4 oz (81.4 kg)    Physical Exam Vitals reviewed.  Constitutional:      General: He is not  in acute distress.    Appearance: Normal appearance. He is not ill-appearing, toxic-appearing or diaphoretic.  HENT:     Nose: Nose normal.     Mouth/Throat:     Mouth: Mucous membranes are moist.  Eyes:     General: No scleral icterus.    Conjunctiva/sclera: Conjunctivae normal.  Cardiovascular:     Rate and Rhythm: Regular rhythm. Tachycardia present.     Heart sounds: No murmur heard.   No friction rub. No gallop.  Pulmonary:     Effort: Pulmonary effort is normal. No tachypnea or accessory muscle usage.      Breath sounds: No stridor. Examination of the right-lower field reveals rhonchi. Examination of the left-lower field reveals rhonchi. Rhonchi present. No decreased breath sounds, wheezing or rales.  Abdominal:     General: Abdomen is flat.     Palpations: There is no mass.     Tenderness: There is no abdominal tenderness. There is no guarding or rebound.     Hernia: No hernia is present.  Musculoskeletal:        General: Normal range of motion.     Cervical back: Neck supple.     Right lower leg: No edema.     Left lower leg: No edema.  Lymphadenopathy:     Cervical: No cervical adenopathy.  Skin:    General: Skin is warm and dry.     Coloration: Skin is not pale.     Findings: No rash.  Neurological:     General: No focal deficit present.     Mental Status: He is alert.  Psychiatric:        Mood and Affect: Mood normal.        Behavior: Behavior normal.    Lab Results  Component Value Date   WBC 8.7 10/03/2021   HGB 15.3 10/03/2021   HCT 45.8 10/03/2021   PLT 341.0 10/03/2021   GLUCOSE 111 (H) 10/03/2021   CHOL 218 (H) 10/03/2021   TRIG 95.0 10/03/2021   HDL 72.50 10/03/2021   LDLDIRECT 134.3 03/27/2010   LDLCALC 126 (H) 10/03/2021   ALT 94 (H) 10/03/2021   AST 45 (H) 10/03/2021   NA 137 10/03/2021   K 4.3 10/03/2021   CL 99 10/03/2021   CREATININE 0.86 10/03/2021   BUN 18 10/03/2021   CO2 30 10/03/2021   TSH 0.90 10/03/2021   PSA 0.21 05/11/2021   INR 1.00 10/08/2017    DG Chest 2 View  Result Date: 10/03/2021 CLINICAL DATA:  cough, LGF for one week mildly increased conspicuity of EXAM: CHEST - 2 VIEW COMPARISON:  October 08, 2017. FINDINGS: Linear left basilar opacities. No confluent consolidation. No visible pleural effusions or pneumothorax. Cardiac silhouette is similar in within normal limits. Mild S-shaped thoracolumbar curvature. IMPRESSION: Linear left basilar opacities, most likely atelectasis/scar. Infection is not excluded. Electronically Signed    By: Margaretha Sheffield M.D.   On: 10/03/2021 11:03     Assessment & Plan:   Dan Dudley was seen today for annual exam and cough.  Diagnoses and all orders for this visit:  Encounter for general adult medical examination with abnormal findings- Exam completed, labs reviewed, vaccines are up-to-date, no cancer screenings indicated, patient education was given.  Subacute cough- There are left basilar opacities on his chest x-ray.  His COVID antibody is elevated.  He also has abnormal LFTs.  He has presented too late to be treated for COVID-19 infection.  Will treat for atypical and typical causes of pneumonia. -  CBC with Differential/Platelet; Future -     SARS-COV-2 IgG; Future -     DG Chest 2 View; Future -     SARS-COV-2 IgG -     CBC with Differential/Platelet  Laryngitis, chronic -     DG Chest 2 View; Future -     Ambulatory referral to ENT  Essential hypertension- His blood pressure is adequately well controlled. -     CBC with Differential/Platelet; Future -     Basic metabolic panel; Future -     Thyroid Panel With TSH; Future -     Urinalysis, Routine w reflex microscopic; Future -     Urinalysis, Routine w reflex microscopic -     Thyroid Panel With TSH -     Basic metabolic panel -     CBC with Differential/Platelet  Hyperlipidemia with target LDL less than 130- Will start a statin for CV risk reduction. -     Lipid panel; Future -     Hepatic function panel; Future -     Hepatic function panel -     Lipid panel  Weight loss- Other than the abnormal LFTs his liver enzymes are negative for secondary causes. -     CBC with Differential/Platelet; Future -     Thyroid Panel With TSH; Future -     Urinalysis, Routine w reflex microscopic; Future -     DG Chest 2 View; Future -     Urinalysis, Routine w reflex microscopic -     Thyroid Panel With TSH -     CBC with Differential/Platelet  Pneumonia of left lower lobe due to infectious organism -     moxifloxacin  (AVELOX) 400 MG tablet; Take 1 tablet (400 mg total) by mouth daily for 7 days.  Acute bronchitis due to COVID-19 virus- He has presented too late to be treated.  His symptoms are resolving.  I have discontinued [place] COVID-19 Bival Booster. I am also having him start on moxifloxacin. Additionally, I am having him maintain his calcium carbonate, aspirin EC, omeprazole, tamsulosin, acetaminophen, mirabegron ER, and triamcinolone cream.  Meds ordered this encounter  Medications   moxifloxacin (AVELOX) 400 MG tablet    Sig: Take 1 tablet (400 mg total) by mouth daily for 7 days.    Dispense:  7 tablet    Refill:  0      Follow-up: Return in about 3 weeks (around 10/24/2021).  Scarlette Calico, MD

## 2021-10-04 ENCOUNTER — Encounter: Payer: Self-pay | Admitting: Internal Medicine

## 2021-10-04 LAB — THYROID PANEL WITH TSH
Free Thyroxine Index: 2.2 (ref 1.4–3.8)
T3 Uptake: 33 % (ref 22–35)
T4, Total: 6.7 ug/dL (ref 4.9–10.5)
TSH: 0.9 mIU/L (ref 0.40–4.50)

## 2021-10-04 MED ORDER — PRAVASTATIN SODIUM 20 MG PO TABS: 20.0000 mg | ORAL_TABLET | Freq: Every day | ORAL | 1 refills | Status: DC

## 2021-10-04 NOTE — Addendum Note (Signed)
Addended by: Janith Lima on: 10/04/2021 08:26 AM   Modules accepted: Orders

## 2021-10-06 ENCOUNTER — Other Ambulatory Visit: Payer: Self-pay | Admitting: Internal Medicine

## 2021-10-06 ENCOUNTER — Telehealth: Payer: Self-pay | Admitting: Internal Medicine

## 2021-10-06 DIAGNOSIS — G4485 Primary stabbing headache: Secondary | ICD-10-CM | POA: Insufficient documentation

## 2021-10-06 NOTE — Telephone Encounter (Signed)
Ebony Hail from [facility] has called and states they do not accept pt. Insurance for referral that was sent over to their office. Please send somewhere else.    Callback #- [phone]

## 2021-10-11 ENCOUNTER — Encounter: Payer: Self-pay | Admitting: Internal Medicine

## 2021-10-11 ENCOUNTER — Other Ambulatory Visit: Payer: Self-pay

## 2021-10-11 ENCOUNTER — Ambulatory Visit (INDEPENDENT_AMBULATORY_CARE_PROVIDER_SITE_OTHER)
Admission: RE | Admit: 2021-10-11 | Discharge: 2021-10-11 | Disposition: A | Discharge status: Home / Self Care | Payer: Medicare HMO | Source: Ambulatory Visit | Attending: Internal Medicine | Admitting: Internal Medicine

## 2021-10-11 DIAGNOSIS — G4485 Primary stabbing headache: Secondary | ICD-10-CM

## 2021-10-11 DIAGNOSIS — R519 Headache, unspecified: Secondary | ICD-10-CM | POA: Diagnosis not present

## 2021-10-16 ENCOUNTER — Other Ambulatory Visit: Payer: Self-pay

## 2021-10-16 ENCOUNTER — Ambulatory Visit (INDEPENDENT_AMBULATORY_CARE_PROVIDER_SITE_OTHER): Payer: Medicare HMO | Admitting: Internal Medicine

## 2021-10-16 ENCOUNTER — Ambulatory Visit (INDEPENDENT_AMBULATORY_CARE_PROVIDER_SITE_OTHER): Payer: Medicare HMO

## 2021-10-16 ENCOUNTER — Encounter: Payer: Self-pay | Admitting: Internal Medicine

## 2021-10-16 VITALS — BP 126/77 | HR 94 | Temp 98.1°F | Resp 16 | Ht 71.0 in | Wt 168.0 lb

## 2021-10-16 DIAGNOSIS — J9811 Atelectasis: Secondary | ICD-10-CM | POA: Diagnosis not present

## 2021-10-16 DIAGNOSIS — J189 Pneumonia, unspecified organism: Secondary | ICD-10-CM

## 2021-10-16 DIAGNOSIS — G4485 Primary stabbing headache: Secondary | ICD-10-CM | POA: Diagnosis not present

## 2021-10-16 DIAGNOSIS — Z23 Encounter for immunization: Secondary | ICD-10-CM | POA: Diagnosis not present

## 2021-10-16 DIAGNOSIS — I10 Essential (primary) hypertension: Secondary | ICD-10-CM

## 2021-10-16 DIAGNOSIS — J370 Chronic laryngitis: Secondary | ICD-10-CM | POA: Diagnosis not present

## 2021-10-16 NOTE — Progress Notes (Signed)
Subjective:  Patient ID: Dan Dudley, male    DOB: 05-04-1946  Age: 75 y.o. MRN: [id number]  CC: Follow-up  This visit occurred during the SARS-CoV-2 public health emergency.  Safety protocols were in place, including screening questions prior to the visit, additional usage of staff PPE, and extensive cleaning of exam room while observing appropriate contact time as indicated for disinfecting solutions.    HPI Dan Dudley presents for f/up -   His headache has resolved and his cough has markedly improved.  He can take a deep breath without discomfort.  He denies chest pain, shortness of breath, diaphoresis, fever, chills, hemoptysis, or night sweats.  I referred him to ENT but he says that Dr. Does not take his insurance so he needs a new referral.  Outpatient Medications Prior to Visit  Medication Sig Dispense Refill   acetaminophen (TYLENOL) 325 MG tablet Take 650 mg by mouth every 6 (six) hours as needed.     aspirin EC 325 MG tablet Take 325 mg by mouth as needed.     calcium carbonate (TUMS - DOSED IN MG ELEMENTAL CALCIUM) 500 MG chewable tablet Chew 1 tablet by mouth daily as needed for heartburn.     mirabegron ER (MYRBETRIQ) 50 MG TB24 tablet Take 1 tablet (50 mg total) by mouth daily. 30 tablet 4   omeprazole (PRILOSEC) 20 MG capsule Take 20 mg by mouth every morning.     pravastatin (PRAVACHOL) 20 MG tablet Take 1 tablet (20 mg total) by mouth daily. 90 tablet 1   tamsulosin (FLOMAX) 0.4 MG CAPS capsule Take 1 capsule (0.4 mg total) by mouth daily after supper. (Patient taking differently: Take by mouth 2 (two) times daily. Takes 2 pills once daily) 30 capsule 5   triamcinolone cream (KENALOG) 0.1 % Apply 1 application topically 2 (two) times daily. 45 g 0   No facility-administered medications prior to visit.    ROS Review of Systems  Constitutional:  Negative for chills, fatigue and fever.  HENT:  Positive for voice change. Negative for sore throat.    Eyes: Negative.   Respiratory:  Positive for cough. Negative for chest tightness, shortness of breath and wheezing.   Cardiovascular:  Negative for chest pain, palpitations and leg swelling.  Gastrointestinal:  Negative for abdominal pain, diarrhea, nausea and vomiting.  Endocrine: Negative.   Genitourinary: Negative.  Negative for difficulty urinating.  Musculoskeletal: Negative.  Negative for arthralgias and myalgias.  Skin: Negative.  Negative for color change, pallor and rash.  Neurological: Negative.  Negative for dizziness, weakness, light-headedness, numbness and headaches.  Hematological:  Negative for adenopathy. Does not bruise/bleed easily.  Psychiatric/Behavioral: Negative.     Objective:  BP 126/77 (BP Location: Left Arm, Patient Position: Sitting, Cuff Size: Large)   Pulse 94   Temp 98.1 F (36.7 C) (Oral)   Resp 16   Ht 5\' 11"  (1.803 m)   Wt 168 lb (76.2 kg)   SpO2 96%   BMI 23.43 kg/m   BP Readings from Last 3 Encounters:  10/16/21 126/77  10/03/21 122/78  08/26/21 (!) 145/84    Wt Readings from Last 3 Encounters:  10/16/21 168 lb (76.2 kg)  10/03/21 169 lb (76.7 kg)  08/26/21 165 lb (74.8 kg)    Physical Exam Vitals reviewed.  HENT:     Nose: Nose normal.     Mouth/Throat:     Mouth: Mucous membranes are moist.  Eyes:     Conjunctiva/sclera: Conjunctivae normal.  Cardiovascular:  Rate and Rhythm: Normal rate and regular rhythm.     Heart sounds: No murmur heard. Pulmonary:     Effort: Pulmonary effort is normal.     Breath sounds: No stridor. No wheezing, rhonchi or rales.  Abdominal:     General: Abdomen is flat.     Palpations: There is no mass.     Tenderness: There is no abdominal tenderness. There is no guarding.  Musculoskeletal:        General: Normal range of motion.     Cervical back: Neck supple.     Right lower leg: No edema.     Left lower leg: No edema.  Lymphadenopathy:     Cervical: No cervical adenopathy.  Skin:     General: Skin is warm and dry.  Neurological:     General: No focal deficit present.     Mental Status: He is alert.  Psychiatric:        Mood and Affect: Mood normal.    Lab Results  Component Value Date   WBC 8.7 10/03/2021   HGB 15.3 10/03/2021   HCT 45.8 10/03/2021   PLT 341.0 10/03/2021   GLUCOSE 111 (H) 10/03/2021   CHOL 218 (H) 10/03/2021   TRIG 95.0 10/03/2021   HDL 72.50 10/03/2021   LDLDIRECT 134.3 03/27/2010   LDLCALC 126 (H) 10/03/2021   ALT 94 (H) 10/03/2021   AST 45 (H) 10/03/2021   NA 137 10/03/2021   K 4.3 10/03/2021   CL 99 10/03/2021   CREATININE 0.86 10/03/2021   BUN 18 10/03/2021   CO2 30 10/03/2021   TSH 0.90 10/03/2021   PSA 0.21 05/11/2021   INR 1.00 10/08/2017    CT HEAD WO CONTRAST (5MM)  Result Date: 10/11/2021 CLINICAL DATA:  Headache, new or worsening. Right-sided head pain near temple. History of prostate cancer EXAM: CT HEAD WITHOUT CONTRAST TECHNIQUE: Contiguous axial images were obtained from the base of the skull through the vertex without intravenous contrast. COMPARISON:  CT head 11/01/2010 FINDINGS: Brain: There is no evidence of acute intracranial hemorrhage, mass lesion, brain edema or extra-axial fluid collection. The ventricles and subarachnoid spaces are appropriately sized for age. There is no CT evidence of acute cortical infarction. Vascular: Intracranial vascular calcifications. No hyperdense vessel identified. Skull: Negative for fracture or focal lesion. Sinuses/Orbits: The visualized paranasal sinuses and mastoid air cells are clear. No orbital abnormalities are seen. Other: None. IMPRESSION: Unremarkable noncontrast head CT.  No acute intracranial findings. Electronically Signed   By: Richardean Sale M.D.   On: 10/11/2021 11:38   DG Chest 2 View  Result Date: 10/16/2021 CLINICAL DATA:  Follow-up pneumonia EXAM: CHEST - 2 VIEW COMPARISON:  None. FINDINGS: Stable cardiac and mediastinal contours. Interval resolution of linear  left basilar opacities. Mild left basilar atelectasis. No new parenchymal process. No pleural effusion pneumothorax. IMPRESSION: Interval resolution of linear left basilar opacities. Electronically Signed   By: Yetta Glassman M.D.   On: 10/16/2021 14:35     Assessment & Plan:   Jesaiah was seen today for follow-up.  Diagnoses and all orders for this visit:  Pneumonia of left lower lobe due to infectious organism- This has resolved. -     DG Chest 2 View; Future  Laryngitis, chronic -     Ambulatory referral to ENT  Essential hypertension- His blood pressure is adequately well controlled.  Primary stabbing headache- His CT scan is negative and the headache has resolved.  I think the headache was related to the  COVID-19 infection.  Other orders -     Pneumococcal polysaccharide vaccine 23-valent greater than or equal to 2yo subcutaneous/IM  I am having Gerome Apley maintain his calcium carbonate, aspirin EC, omeprazole, tamsulosin, acetaminophen, mirabegron ER, triamcinolone cream, and pravastatin.  No orders of the defined types were placed in this encounter.    Follow-up: Return if symptoms worsen or fail to improve.  Scarlette Calico, MD

## 2021-10-16 NOTE — Patient Instructions (Signed)

## 2021-10-18 ENCOUNTER — Encounter: Payer: Self-pay | Admitting: Internal Medicine

## 2021-10-24 ENCOUNTER — Other Ambulatory Visit: Payer: Self-pay

## 2021-10-24 ENCOUNTER — Ambulatory Visit: Payer: Medicare HMO | Admitting: Internal Medicine

## 2021-10-24 ENCOUNTER — Encounter: Payer: Self-pay | Admitting: Internal Medicine

## 2021-10-24 VITALS — BP 112/80 | HR 88 | Temp 98.2°F | Ht 70.0 in | Wt 169.0 lb

## 2021-10-24 DIAGNOSIS — R051 Acute cough: Secondary | ICD-10-CM

## 2021-10-24 DIAGNOSIS — R5383 Other fatigue: Secondary | ICD-10-CM | POA: Diagnosis not present

## 2021-10-24 DIAGNOSIS — R9389 Abnormal findings on diagnostic imaging of other specified body structures: Secondary | ICD-10-CM | POA: Diagnosis not present

## 2021-10-24 NOTE — Patient Instructions (Addendum)
Give [place] a call back if you're not feeling better in a month.   You can use a water based lubricant like Ocean/Ayr gel in your nose with a q-tip., avoid anything oil or petroleum based.

## 2021-10-24 NOTE — Progress Notes (Signed)
Dan Dudley    [id number]    1945/12/17  Primary Care Physician:Jones, Arvid Right, MD  Referring Physician: Janith Lima, MD [address] [place],  [place] [address] Reason for Consultation: pneumonia Date of Consultation: 10/24/2021  Chief complaint:   Chief Complaint  Patient presents with   Consult    Pneumonia in November, still experiencing SOB     HPI: Dan Dudley is a 75 y.o. man here for new patient evaluation of pneumonia. He had a URI symptoms with cough, dyspnea, fatigue. Covid IgG was positive. He is very active and wasn't able to keep up with his activities. Cough was dry without phlegm. Treated with antibiotics for 7 days. He continues to have fatigue, has to cough with deep breathing. Gets tired easily. Cough is only with a deep breath in. Has not taken any medications for this.   He has had some nose bleeds due to nose bleeds. Has been putting moistened qtip inside his nose    Had a home covid test and it was negative.   No fevers, chills, night sweats. No unintentional weight loss in the last month.   Social history:  Occupation: worked in Architect, Quarry manager, had his own business.  Exposures: lives at home with wife. One dog.  Smoking history: very minor remote smoking history.   Social History   Occupational History   Occupation: Retired    Fish farm manager: SELF-EMPLOYED  Tobacco Use   Smoking status: Former    Packs/day: 0.25    Years: 8.00    Pack years: 2.00    Types: Cigarettes    Quit date: 11/19/1968    Years since quitting: 52.9   Smokeless tobacco: Never  Vaping Use   Vaping Use: Never used  Substance and Sexual Activity   Alcohol use: Not Currently   Drug use: No   Sexual activity: Yes    Relevant family history:  Family History  Problem Relation Age of Onset   COPD Other    Heart disease Father        age 2, nonsmoker, heavy drinker   Cancer Mother        unknown   Colon cancer Neg Hx     Esophageal cancer Neg Hx    Rectal cancer Neg Hx    Stomach cancer Neg Hx     Past Medical History:  Diagnosis Date   GERD (gastroesophageal reflux disease)    Headache(784.0)    Heart murmur    "years ago"- 1990s   Hemorrhoids    internal/ external   Hyperlipidemia    per pt watching diet   Hypertension per pt thinks its white coat because at home normal bp   per pt currently no medication as trial w/ pcp ok (was taking micardis 40mg )   Nocturia more than twice per night    Nodular hyperplasia of prostate gland    Prostate cancer [facility]) urologist-  dr budzyn/  oncologist-  dr Tammi Klippel   dx 07-15-2017 (bx)-- Stage T1c, Gleason 4+3, PSA 9.06, vol 80.2cc-- planned radiationtherpy    Past Surgical History:  Procedure Laterality Date   COLONOSCOPY  last one 08-15-2010   GOLD SEED IMPLANT N/A 10/16/2017   Procedure: GOLD SEED IMPLANT;  Surgeon: Nickie Retort, MD;  Location: [facility]);  Service: Urology;  Laterality: N/A;   HEMANGIOMA EXCISION  09/25/2004   elliptical excision left lower lip   INGUINAL HERNIA REPAIR Bilateral left  10-25-2000  dr Hassell Done  right [place] N/A 07/15/2017   Procedure: BIOPSY TRANSRECTAL ULTRASONIC PROSTATE (TUBP);  Surgeon: Nickie Retort, MD;  Location: [facility];  Service: Urology;  Laterality: N/A;   SPACE OAR INSTILLATION N/A 10/16/2017   Procedure: SPACE OAR INSTILLATION;  Surgeon: Nickie Retort, MD;  Location[facility];  Service: Urology;  Laterality: N/A;     Physical Exam: Blood pressure 112/80, pulse 88, temperature 98.2 F (36.8 C), height 5\' 10"  (1.778 m), weight 169 lb (76.7 kg), SpO2 95 %. Gen:      No acute distress ENT:  right nasal septum is excoriated and bloody, no nasal polyps, mucus membranes dry in nose Lungs:    No increased respiratory effort, symmetric chest wall excursion, clear to auscultation bilaterally, no wheezes or crackles, dry  coughing with deep inspiration CV:         Regular rate and rhythm; no murmurs, rubs, or gallops.  No pedal edema Abd:      + bowel sounds; soft, non-tender; no distension MSK: no acute synovitis of DIP or PIP joints, no mechanics hands.  Skin:      Warm and dry; no rashes Neuro: normal speech, no focal facial asymmetry Psych: alert and oriented x3, normal mood and affect   Data Reviewed/Medical Decision Making:  Independent interpretation of tests: Imaging:  Review of patient's chest xray images Nov 2022 revealed mild left basilar atelectasis which resolved on subsequent imaging. The patient's images have been independently reviewed by me.    PFTs:  No flowsheet data found.  Labs:  Lab Results  Component Value Date   WBC 8.7 10/03/2021   HGB 15.3 10/03/2021   HCT 45.8 10/03/2021   MCV 93.0 10/03/2021   PLT 341.0 10/03/2021   Covid IgG elevated   Immunization status:  Immunization History  Administered Date(s) Administered   Fluad Quad(high Dose 65+) 07/15/2019, 08/03/2020   Influenza Split 10/04/2011, 08/19/2012   Influenza Whole 08/09/2010   Influenza, High Dose Seasonal PF 08/07/2016, 08/13/2017, 08/13/2018   Influenza,inj,Quad PF,6+ Mos 08/14/2013, 08/13/2014, 08/17/2015, 08/07/2021   Moderna Covid-19 Vaccine Bivalent Booster 13yrs & up 09/22/2021   Moderna SARS-COV2 Booster Vaccination 09/13/2020, 06/04/2021   Moderna Sars-Covid-2 Vaccination 12/15/2019, 01/12/2020   Pneumococcal Conjugate-13 10/19/2013   Pneumococcal Polysaccharide-23 08/17/2015, 10/16/2021   Td 11/19/2001   Tdap 09/25/2012   Zoster Recombinat (Shingrix) 08/14/2019, 10/19/2019   Zoster, Live 05/14/2011     I reviewed prior external note(s) from PCP, urgent care  I reviewed the result(s) of the labs and imaging as noted above.   I have ordered    Assessment:  Fatigue Abnormal chest xray Epistaxis  Plan/Recommendations:  Recommend hudmidification at night with nasal saline gel such as  ayr to help with lubrication in his nose with epistaxis.  Fatigue and coughing should improve - he is only 3 weeks from his initial diagnosis.  His chest xray has improved and we reviewed in detail the findings of left basilar atelectasis.    We discussed disease management and progression at length today. He will let me know if not improving by 8 weeks out and we can proceed with PFTs.   I spent 45 minutes in the care of this patient today including pre-charting, chart review, review of results, face-to-face care, coordination of care and communication with consultants etc.).    Return to Care: As needed  Lenice Llamas, MD Pulmonary and [place]  CC: Janith Lima, MD

## 2021-11-02 DIAGNOSIS — C61 Malignant neoplasm of prostate: Secondary | ICD-10-CM | POA: Diagnosis not present

## 2021-11-09 DIAGNOSIS — C61 Malignant neoplasm of prostate: Secondary | ICD-10-CM | POA: Diagnosis not present

## 2021-11-09 DIAGNOSIS — R351 Nocturia: Secondary | ICD-10-CM | POA: Diagnosis not present

## 2021-11-09 DIAGNOSIS — N401 Enlarged prostate with lower urinary tract symptoms: Secondary | ICD-10-CM | POA: Diagnosis not present

## 2022-01-18 DIAGNOSIS — K219 Gastro-esophageal reflux disease without esophagitis: Secondary | ICD-10-CM | POA: Diagnosis not present

## 2022-01-18 DIAGNOSIS — R221 Localized swelling, mass and lump, neck: Secondary | ICD-10-CM | POA: Diagnosis not present

## 2022-01-19 ENCOUNTER — Other Ambulatory Visit: Payer: Self-pay | Admitting: Otolaryngology

## 2022-01-19 DIAGNOSIS — R220 Localized swelling, mass and lump, head: Secondary | ICD-10-CM

## 2022-01-23 ENCOUNTER — Other Ambulatory Visit: Payer: Self-pay

## 2022-01-23 ENCOUNTER — Ambulatory Visit
Admission: RE | Admit: 2022-01-23 | Discharge: 2022-01-23 | Disposition: A | Discharge status: Home / Self Care | Payer: Medicare HMO | Source: Ambulatory Visit | Attending: Otolaryngology | Admitting: Otolaryngology

## 2022-01-23 DIAGNOSIS — R220 Localized swelling, mass and lump, head: Secondary | ICD-10-CM

## 2022-01-23 DIAGNOSIS — R221 Localized swelling, mass and lump, neck: Secondary | ICD-10-CM | POA: Diagnosis not present

## 2022-03-02 DIAGNOSIS — R0989 Other specified symptoms and signs involving the circulatory and respiratory systems: Secondary | ICD-10-CM | POA: Diagnosis not present

## 2022-03-02 DIAGNOSIS — K219 Gastro-esophageal reflux disease without esophagitis: Secondary | ICD-10-CM | POA: Diagnosis not present

## 2022-03-02 DIAGNOSIS — R1314 Dysphagia, pharyngoesophageal phase: Secondary | ICD-10-CM | POA: Diagnosis not present

## 2022-03-02 DIAGNOSIS — R0683 Snoring: Secondary | ICD-10-CM | POA: Diagnosis not present

## 2022-03-05 ENCOUNTER — Other Ambulatory Visit: Payer: Self-pay | Admitting: Otolaryngology

## 2022-03-05 DIAGNOSIS — K219 Gastro-esophageal reflux disease without esophagitis: Secondary | ICD-10-CM

## 2022-03-05 DIAGNOSIS — R0989 Other specified symptoms and signs involving the circulatory and respiratory systems: Secondary | ICD-10-CM

## 2022-03-19 ENCOUNTER — Ambulatory Visit
Admission: RE | Admit: 2022-03-19 | Discharge: 2022-03-19 | Disposition: A | Discharge status: Home / Self Care | Payer: Medicare HMO | Source: Ambulatory Visit | Attending: Otolaryngology | Admitting: Otolaryngology

## 2022-03-19 DIAGNOSIS — R09A2 Foreign body sensation, throat: Secondary | ICD-10-CM

## 2022-03-19 DIAGNOSIS — R1314 Dysphagia, pharyngoesophageal phase: Secondary | ICD-10-CM

## 2022-03-19 DIAGNOSIS — K219 Gastro-esophageal reflux disease without esophagitis: Secondary | ICD-10-CM

## 2022-03-19 DIAGNOSIS — R0989 Other specified symptoms and signs involving the circulatory and respiratory systems: Secondary | ICD-10-CM | POA: Diagnosis not present

## 2022-03-19 DIAGNOSIS — R059 Cough, unspecified: Secondary | ICD-10-CM | POA: Diagnosis not present

## 2022-03-19 DIAGNOSIS — R1310 Dysphagia, unspecified: Secondary | ICD-10-CM | POA: Diagnosis not present

## 2022-03-19 NOTE — Therapy (Signed)
[place] [facility] ?[address] ?[place], [place], [address] ?Phone: [phone]   Fax:    ? ?Modified Barium Swallow ? ?Patient Details  ?Name: Dan Dudley ?MRN: [id number] ?Date of Birth: 12-Apr-1946 ?No data recorded ? ?Encounter Date: 03/19/2022 ? ? End of Session - 03/19/22 1753   ? ? Visit Number 1   ? Number of Visits 1   ? Date for SLP Re-Evaluation 03/19/22   ? SLP Start Time 1235   ? SLP Stop Time  1315   ? SLP Time Calculation (min) 40 min   ? Activity Tolerance Patient tolerated treatment well   ? ?  ?  ? ?  ? ? ?Past Medical History:  ?Diagnosis Date  ? GERD (gastroesophageal reflux disease)   ? Headache(784.0)   ? Heart murmur   ? "years ago"- 1990s  ? Hemorrhoids   ? internal/ external  ? Hyperlipidemia   ? per pt watching diet  ? Hypertension per pt thinks its white coat because at home normal bp  ? per pt currently no medication as trial w/ pcp ok (was taking micardis 36m)  ? Nocturia more than twice per night   ? Nodular hyperplasia of prostate gland   ? Prostate cancer ([facility] urologist-  dr budzyn/  oncologist-  dr mTammi Klippel ? dx 07-15-2017 (bx)-- Stage T1c, Gleason 4+3, PSA 9.06, vol 80.2cc-- planned radiationtherpy  ? ? ?Past Surgical History:  ?Procedure Laterality Date  ? COLONOSCOPY  last one 08-15-2010  ? GOLD SEED IMPLANT N/A 10/16/2017  ? Procedure: GOLD SEED IMPLANT;  Surgeon: BNickie Retort MD;  [facility]  Service: Urology;  Laterality: N/A;  ? HEMANGIOMA EXCISION  09/25/2004  ? elliptical excision left lower lip  ? INGUINAL HERNIA REPAIR Bilateral left 10-25-2000  dr mHassell Done right [place] ? PROSTATE BIOPSY N/A 07/15/2017  ? Procedure: BIOPSY TRANSRECTAL ULTRASONIC PROSTATE (TUBP);  Surgeon: BNickie Retort MD;  [facility]  Service: Urology;  Laterality: N/A;  ? SPACE OAR INSTILLATION N/A 10/16/2017  ? Procedure: SPACE OAR INSTILLATION;  Surgeon: BNickie Retort MD;  [facility]  Service: Urology;  Laterality: N/A;  ? ? ?There were no vitals filed for this visit. ? ? Subjective Assessment - 03/19/22 1727   ? ? Subjective Pt reports globus sensation at all times, not just when eating. Denies trouble swallowing   ? Currently in Pain? No/denies   ? ?  ?  ? ?  ? ? ? ? 03/19/22 1700  ?SLP Visit Information  ?SLP Received On 03/19/22  ?Subjective  ?Subjective reports globus sensation  ?Pain Assessment  ?Pain Assessment No/denies pain  ?General Information  ?Date of Onset 03/19/22  ?HPI Patient is a 76y.o. male with past medical history including GERD, HLD, HTN, prostate cancer  referred by Dr. VPryor Ochoafor MBS. Hx of PNA per chart review in November 2022. Pt reports history of globus sensation throughout the day. 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  ?Type of Study MBS-Modified Barium Swallow Study  ?Previous Swallow Assessment none on file  ?Diet Prior to this Study Regular;Thin liquids  ?Temperature Spikes Noted No  ?Respiratory Status Room air  ?History of Recent Intubation No  ?Behavior/Cognition Alert;Cooperative;Pleasant mood  ?Oral Cavity Assessment WFL  ?Oral Care Completed by SLP No  ?Oral Cavity - Dentition Adequate  natural dentition  ?Vision Functional for self feeding  ?Self-Feeding Abilities Able to feed self  ?Patient Positioning Upright in chair  ?Baseline Vocal Quality Normal  ?Volitional Cough Strong  ?Volitional Swallow Able to elicit  ?Anatomy WFL  ?Pharyngeal Secretions Not observed secondary MBS  ?Oral Assessment (Complete on admission/transfer/every shift)  ?Does patient have any of the following "high(er) risk" factors? None of the above  ?Does patient have any of the following "at risk" factors? None of the above  ?Oral Motor/Sensory Function  ?Overall Oral Motor/Sensory Function WFL  ?Oral  Preparation/Oral Phase  ?Oral Phase WFL  ?Pharyngeal Phase  ?Pharyngeal Phase WFL  ?Pharyngeal - Pudding  ?Pharyngeal- Pudding Teaspoon Pharyngeal residue - cp segment  ?Pharyngeal Material does not enter airway  ?Pharyngeal - Nectar  ?Pharyngeal- Nectar Teaspoon Pharyngeal residue - cp segment  ?Pharyngeal Material does not enter airway  ?Pharyngeal- Nectar Cup Pharyngeal residue - cp segment  ?Pharyngeal Material does not enter airway  ?Pharyngeal - Thin  ?Pharyngeal- Thin Teaspoon Pharyngeal residue - cp segment  ?Pharyngeal Material does not enter airway  ?Pharyngeal- Thin Cup Pharyngeal residue - cp segment  ?Pharyngeal Material does not enter airway  ?Pharyngeal - Solids  ?Pharyngeal- Regular Pharyngeal residue - cp segment  ?Pharyngeal Material does not enter airway  ?Cervical Esophageal Phase  ?Cervical Esophageal Phase Impaired  ?Cervical Esophageal Phase - Pudding  ?Pudding Teaspoon Reduced cricopharyngeal relaxation  ?Cervical Esophageal Phase - Nectar  ?Nectar Teaspoon Reduced cricopharyngeal relaxation  ?Nectar Cup Reduced cricopharyngeal relaxation  ?Cervical Esophageal Phase - Thin  ?Thin Teaspoon Reduced cricopharyngeal relaxation  ?Thin Cup Reduced cricopharyngeal relaxation  ?Cervical Esophageal Phase - Solids  ?Puree Reduced cricopharyngeal relaxation  ?Regular Reduced cricopharyngeal relaxation  ?Clinical Impression  ?Clinical Impression Patient presents with functional oropharyngeal swallowing and mild structural pharyngoesophageal phase dysphagia. Oral phase characterized by adequate bolus hold and manipulation, lingual transport, and oral clearance. Pharyngeal swallow initiation occurs at the level of the valleculae. Pharyngeal phase noted for adequate tongue base retraction, pharyngeal constriction and hyolaryngeal excursion. Pt demonstrates adequate airway closure and full epiglottic deflection, with no laryngeal penetration or tracheal aspiration observed. The pharyngoesophageal phase is  noted for mildly reduced amplitude of opening, with appearance of bony protrusions with scalloping effect to the posterior wall of the cervical esophagus. There is mildly reduced/slowed clearance through the cervical esophagus with intermittent mild retention just above the cricopharyngeal segment, which clears with subsequent swallow. An esophageal sweep was performed in standing AP position with nectar thick liquids, appearance retrograde movement in the distal esophagus. Consider further evaluation with GI; discussed this with pt who states he is inclined to defer, as he denies impact on swallow function. Encouraged pt to consider this if he begins having difficulty swallowing. Recommend pt continue regular diet and thin liquids, using general aspiration precautions. Pt was also provided with handout of recommendations and GERD/LPR education sheet.  ?SLP Visit Diagnosis Dysphagia, pharyngoesophageal phase (R13.14)  ?Impact on safety and function Mild aspiration risk  ?Swallow Evaluation Recommendations  ?Recommended Consults Consider esophageal assessment ?(esophagram)  ?SLP Diet Recommendations Regular solids;Thin liquid  ?Liquid Administration via Cup;Straw  ?Medication Administration Whole meds with liquid  ?Supervision Patient able to self feed  ?Compensations Slow rate;Small sips/bites ?(swallow twice intermittently)  ?Treatment Plan  ?Oral Care Recommendations Oral care BID  ?Treatment Recommendations No treatment recommended at this time  ?Individuals Consulted  ?Consulted and Agree with Results and Recommendations Patient  ?Report Sent to  Referring physician  ?Progression Toward Goals  ?Progression  toward goals Goals met, education completed, patient discharged from SLP  ?SLP Time Calculation  ?SLP Start Time (ACUTE ONLY) 1235  ?SLP Stop Time (ACUTE ONLY) 1315  ?SLP Time Calculation (min) (ACUTE ONLY) 40 min  ?SLP Evaluations  ?$ SLP Speech Visit 1 Visit  ?SLP Evaluations  ?$MBS Swallow 1 Procedure   ? ?Dysphagia, pharyngoesophageal phase ? ?Globus sensation - Plan: DG SWALLOW FUNC OP MEDICARE SPEECH PATH, DG SWALLOW FUNC OP MEDICARE SPEECH PATH ? ?Gastroesophageal reflux disease without esophagitis - Plan: DG SWAL

## 2022-03-21 ENCOUNTER — Ambulatory Visit (INDEPENDENT_AMBULATORY_CARE_PROVIDER_SITE_OTHER): Payer: Medicare HMO | Admitting: Family Medicine

## 2022-03-21 ENCOUNTER — Encounter: Payer: Self-pay | Admitting: Family Medicine

## 2022-03-21 VITALS — BP 122/84 | HR 78 | Temp 97.6°F | Ht 70.0 in | Wt 175.0 lb

## 2022-03-21 DIAGNOSIS — R234 Changes in skin texture: Secondary | ICD-10-CM

## 2022-03-21 DIAGNOSIS — N649 Disorder of breast, unspecified: Secondary | ICD-10-CM | POA: Diagnosis not present

## 2022-03-21 DIAGNOSIS — Z8546 Personal history of malignant neoplasm of prostate: Secondary | ICD-10-CM

## 2022-03-21 LAB — PSA: PSA: 0.16 ng/mL (ref 0.10–4.00)

## 2022-03-21 NOTE — Progress Notes (Signed)
? ?Subjective:  ? ? ? Patient ID: Dan Dudley, male    DOB: 1946/10/20, 76 y.o.   MRN: [id number] ? ?Chief Complaint  ?Patient presents with  ? Mass  ?  Left nipple white bump just noticed yesterday. Denies any pain or discharge from area.   ? ? ?HPI ?Patient is in today for a new problem of a white bump on his left nipple. He noticed the problem yesterday. States he has had itching in his left breast for the past 2-3 months. States he is concerned that is could be breast cancer since he has a history of prostate cancer.  ? ?No nipple discharge.  ?Denies fever, chills, chest pain, palpitations, shortness of breath, abdominal pain, N/V/D. Good appetite and energy level.  ?He is followed closely for prostate cancer and his PSA levels have been very good per patient.  ? ?There are no preventive care reminders to display for this patient. ? ?Past Medical History:  ?Diagnosis Date  ? GERD (gastroesophageal reflux disease)   ? Headache(784.0)   ? Heart murmur   ? "years ago"- 1990s  ? Hemorrhoids   ? internal/ external  ? Hyperlipidemia   ? per pt watching diet  ? Hypertension per pt thinks its white coat because at home normal bp  ? per pt currently no medication as trial w/ pcp ok (was taking micardis '40mg'$ )  ? Nocturia more than twice per night   ? Nodular hyperplasia of prostate gland   ? Prostate cancer [facility]) urologist-  dr budzyn/  oncologist-  dr Tammi Klippel  ? dx 07-15-2017 (bx)-- Stage T1c, Gleason 4+3, PSA 9.06, vol 80.2cc-- planned radiationtherpy  ? ? ?Past Surgical History:  ?Procedure Laterality Date  ? COLONOSCOPY  last one 08-15-2010  ? GOLD SEED IMPLANT N/A 10/16/2017  ? Procedure: GOLD SEED IMPLANT;  Surgeon: Nickie Retort, MD;  Location[facility];  Service: Urology;  Laterality: N/A;  ? HEMANGIOMA EXCISION  09/25/2004  ? elliptical excision left lower lip  ? INGUINAL HERNIA REPAIR Bilateral left 10-25-2000  dr Hassell Done  right [place]  ? PROSTATE BIOPSY N/A 07/15/2017  ?  Procedure: BIOPSY TRANSRECTAL ULTRASONIC PROSTATE (TUBP);  Surgeon: Nickie Retort, MD;  Location: [facility];  Service: Urology;  Laterality: N/A;  ? SPACE OAR INSTILLATION N/A 10/16/2017  ? Procedure: SPACE OAR INSTILLATION;  Surgeon: Nickie Retort, MD;  Location: [facility];  Service: Urology;  Laterality: N/A;  ? ? ?Family History  ?Problem Relation Age of Onset  ? Cancer Mother   ?     unknown  ? Heart disease Father   ?     age 55, nonsmoker, heavy drinker  ? COPD Other   ? Colon cancer Neg Hx   ? Esophageal cancer Neg Hx   ? Rectal cancer Neg Hx   ? Stomach cancer Neg Hx   ? Lung disease Neg Hx   ? ? ?Social History  ? ?Socioeconomic History  ? Marital status: Married  ?  Spouse name: Not on file  ? Number of children: 2  ? Years of education: Not on file  ? Highest education level: Not on file  ?Occupational History  ? Occupation: Retired  ?  Employer: SELF-EMPLOYED  ?Tobacco Use  ? Smoking status: Former  ?  Packs/day: 0.25  ?  Years: 8.00  ?  Pack years: 2.00  ?  Types: Cigarettes  ?  Quit date: 11/19/1968  ?  Years  since quitting: 53.3  ? Smokeless tobacco: Never  ?Vaping Use  ? Vaping Use: Never used  ?Substance and Sexual Activity  ? Alcohol use: Not Currently  ? Drug use: No  ? Sexual activity: Yes  ?Other Topics Concern  ? Not on file  ?Social History Narrative  ? Married [place] 2 kids. Randall Hiss lives in [place], [place] -bachelor and Radio producer; Katharine Look in [place] with a boy and girl (21 in 2015).   ? Missy-toy poodle (9).   ?   ? Semi retired from Physicist, medical tile  ?   ? Hobbies: enjoys part time work  ? ?Social Determinants of Health  ? ?Financial Resource Strain: Low Risk   ? Difficulty of Paying Living Expenses: Not hard at all  ?Food Insecurity: No Food Insecurity  ? Worried About Charity fundraiser in the Last Year: Never true  ? Ran Out of Food in the Last Year: Never true  ?Transportation Needs: No Transportation Needs  ? Lack of Transportation  (Medical): No  ? Lack of Transportation (Non-Medical): No  ?Physical Activity: Insufficiently Active  ? Days of Exercise per Week: 3 days  ? Minutes of Exercise per Session: 20 min  ?Stress: No Stress Concern Present  ? Feeling of Stress : Not at all  ?Social Connections: Unknown  ? Frequency of Communication with Friends and Family: Twice a week  ? Frequency of Social Gatherings with Friends and Family: Not on file  ? Attends Religious Services: Never  ? Active Member of Clubs or Organizations: No  ? Attends Archivist Meetings: Never  ? Marital Status: Married  ?Intimate Partner Violence: Not At Risk  ? Fear of Current or Ex-Partner: No  ? Emotionally Abused: No  ? Physically Abused: No  ? Sexually Abused: No  ? ? ?Outpatient Medications Prior to Visit  ?Medication Sig Dispense Refill  ? acetaminophen (TYLENOL) 325 MG tablet Take 650 mg by mouth every 6 (six) hours as needed.    ? aspirin EC 325 MG tablet Take 325 mg by mouth as needed.    ? calcium carbonate (TUMS - DOSED IN MG ELEMENTAL CALCIUM) 500 MG chewable tablet Chew 1 tablet by mouth daily as needed for heartburn.    ? mirabegron ER (MYRBETRIQ) 50 MG TB24 tablet Take 1 tablet (50 mg total) by mouth daily. 30 tablet 4  ? omeprazole (PRILOSEC) 20 MG capsule Take 20 mg by mouth every morning.    ? pravastatin (PRAVACHOL) 20 MG tablet Take 1 tablet (20 mg total) by mouth daily. 90 tablet 1  ? tamsulosin (FLOMAX) 0.4 MG CAPS capsule Take 1 capsule (0.4 mg total) by mouth daily after supper. (Patient taking differently: Take by mouth 2 (two) times daily. Takes 2 pills once daily) 30 capsule 5  ? triamcinolone cream (KENALOG) 0.1 % Apply 1 application topically 2 (two) times daily. 45 g 0  ? ?No facility-administered medications prior to visit.  ? ? ?Allergies  ?Allergen Reactions  ? Lipitor [Atorvastatin] Other (See Comments)  ?  "didn't feel right"  ? ? ?ROS ?Pertinent positives and negatives in the history of present illness. ? ?   ?Objective:  ?   ?Physical Exam ?Constitutional:   ?   General: He is not in acute distress. ?   Appearance: Normal appearance. He is not ill-appearing.  ?Cardiovascular:  ?   Rate and Rhythm: Normal rate.  ?Pulmonary:  ?   Effort: Pulmonary effort is normal.  ?Chest:  ?Breasts: ?  Left: Skin change present. No swelling, bleeding, nipple discharge or tenderness.  ?   Comments: White-raised lesion on left nipple at 3 o'clock with a smaller white lesion on his areola at 10 o'clock position.  ?Neurological:  ?   Mental Status: He is alert.  ? ? ?BP 122/84 (BP Location: Left Arm, Patient Position: Sitting, Cuff Size: Large)   Pulse 78   Temp 97.6 ?F (36.4 ?C) (Temporal)   Ht '5\' 10"'$  (1.778 m)   Wt 175 lb (79.4 kg)   SpO2 100%   BMI 25.11 kg/m?  ?Wt Readings from Last 3 Encounters:  ?03/21/22 175 lb (79.4 kg)  ?10/24/21 169 lb (76.7 kg)  ?10/16/21 168 lb (76.2 kg)  ? ? ?   ?Assessment & Plan:  ? ?Problem List Items Addressed This Visit   ?None ?Visit Diagnoses   ? ? Nipple lesion    -  Primary  ? Relevant Orders  ? US BREAST COMPLETE UNI LEFT INC AXILLA  ? MM 3D SCREEN BREAST BILATERAL  ? Prolactin  ? PSA (Completed)  ? Breast skin changes      ? Relevant Orders  ? US BREAST COMPLETE UNI LEFT INC AXILLA  ? MM 3D SCREEN BREAST BILATERAL  ? Prolactin  ? PSA (Completed)  ? History of prostate cancer      ? Relevant Orders  ? Prolactin  ? PSA (Completed)  ? ?  ? ? ?I am having Gerome Apley maintain his calcium carbonate, aspirin EC, omeprazole, tamsulosin, acetaminophen, mirabegron ER, triamcinolone cream, and pravastatin. ? ?No orders of the defined types were placed in this encounter. ? ? ?

## 2022-03-21 NOTE — Patient Instructions (Signed)
Go to the lab on the first floor for blood work before you leave today. ? ?You will hear from [facility] breast center regarding scheduling an ultrasound and mammogram. ?

## 2022-03-22 LAB — PROLACTIN: Prolactin: 3.8 ng/mL (ref 2.0–18.0)

## 2022-03-23 ENCOUNTER — Encounter: Payer: Self-pay | Admitting: Family Medicine

## 2022-03-26 DIAGNOSIS — H43393 Other vitreous opacities, bilateral: Secondary | ICD-10-CM | POA: Diagnosis not present

## 2022-03-26 DIAGNOSIS — H401111 Primary open-angle glaucoma, right eye, mild stage: Secondary | ICD-10-CM | POA: Diagnosis not present

## 2022-03-26 DIAGNOSIS — H2513 Age-related nuclear cataract, bilateral: Secondary | ICD-10-CM | POA: Diagnosis not present

## 2022-03-26 DIAGNOSIS — H40052 Ocular hypertension, left eye: Secondary | ICD-10-CM | POA: Diagnosis not present

## 2022-04-04 ENCOUNTER — Ambulatory Visit
Admission: RE | Admit: 2022-04-04 | Discharge: 2022-04-04 | Disposition: A | Discharge status: Home / Self Care | Payer: Medicare HMO | Source: Ambulatory Visit | Attending: Family Medicine | Admitting: Family Medicine

## 2022-04-04 ENCOUNTER — Ambulatory Visit: Payer: Medicare HMO

## 2022-04-04 DIAGNOSIS — R928 Other abnormal and inconclusive findings on diagnostic imaging of breast: Secondary | ICD-10-CM | POA: Diagnosis not present

## 2022-04-04 DIAGNOSIS — N649 Disorder of breast, unspecified: Secondary | ICD-10-CM

## 2022-04-04 DIAGNOSIS — R234 Changes in skin texture: Secondary | ICD-10-CM

## 2022-05-10 DIAGNOSIS — C61 Malignant neoplasm of prostate: Secondary | ICD-10-CM | POA: Diagnosis not present

## 2022-05-10 LAB — PSA: PSA: 0.1

## 2022-05-14 DIAGNOSIS — H40052 Ocular hypertension, left eye: Secondary | ICD-10-CM | POA: Diagnosis not present

## 2022-05-14 DIAGNOSIS — H401111 Primary open-angle glaucoma, right eye, mild stage: Secondary | ICD-10-CM | POA: Diagnosis not present

## 2022-07-11 ENCOUNTER — Telehealth: Payer: Self-pay

## 2022-07-11 NOTE — Telephone Encounter (Signed)
Last CPE 10/03/21  Pt notified of above & appt scheduled for 10/03/22

## 2022-07-19 DIAGNOSIS — D1801 Hemangioma of skin and subcutaneous tissue: Secondary | ICD-10-CM | POA: Diagnosis not present

## 2022-07-19 DIAGNOSIS — L298 Other pruritus: Secondary | ICD-10-CM | POA: Diagnosis not present

## 2022-07-19 DIAGNOSIS — L821 Other seborrheic keratosis: Secondary | ICD-10-CM | POA: Diagnosis not present

## 2022-07-19 DIAGNOSIS — L820 Inflamed seborrheic keratosis: Secondary | ICD-10-CM | POA: Diagnosis not present

## 2022-07-19 DIAGNOSIS — L814 Other melanin hyperpigmentation: Secondary | ICD-10-CM | POA: Diagnosis not present

## 2022-07-19 DIAGNOSIS — L538 Other specified erythematous conditions: Secondary | ICD-10-CM | POA: Diagnosis not present

## 2022-07-19 DIAGNOSIS — Z789 Other specified health status: Secondary | ICD-10-CM | POA: Diagnosis not present

## 2022-07-30 ENCOUNTER — Ambulatory Visit: Payer: Medicare HMO

## 2022-08-06 ENCOUNTER — Ambulatory Visit (INDEPENDENT_AMBULATORY_CARE_PROVIDER_SITE_OTHER): Payer: Medicare HMO

## 2022-08-06 ENCOUNTER — Other Ambulatory Visit (HOSPITAL_BASED_OUTPATIENT_CLINIC_OR_DEPARTMENT_OTHER): Payer: Self-pay

## 2022-08-06 DIAGNOSIS — Z23 Encounter for immunization: Secondary | ICD-10-CM

## 2022-08-06 MED ORDER — AREXVY 120 MCG/0.5ML IM SUSR
INTRAMUSCULAR | 0 refills | Status: DC
  Filled 2022-08-06: qty 0.5, 1d supply, fill #0

## 2022-08-06 NOTE — Progress Notes (Signed)
After obtaining consent, and per orders of Dr. Ronnald Ramp, injection of Flu given on the left deltoid by Marrian Salvage. Patient instructed to report any adverse reaction to me immediately.

## 2022-08-20 DIAGNOSIS — Z789 Other specified health status: Secondary | ICD-10-CM | POA: Diagnosis not present

## 2022-08-20 DIAGNOSIS — L298 Other pruritus: Secondary | ICD-10-CM | POA: Diagnosis not present

## 2022-08-20 DIAGNOSIS — L820 Inflamed seborrheic keratosis: Secondary | ICD-10-CM | POA: Diagnosis not present

## 2022-08-20 DIAGNOSIS — L538 Other specified erythematous conditions: Secondary | ICD-10-CM | POA: Diagnosis not present

## 2022-09-07 ENCOUNTER — Other Ambulatory Visit (HOSPITAL_BASED_OUTPATIENT_CLINIC_OR_DEPARTMENT_OTHER): Payer: Self-pay

## 2022-09-07 MED ORDER — COVID-19 MRNA VAC-TRIS(PFIZER) 30 MCG/0.3ML IM SUSY
PREFILLED_SYRINGE | INTRAMUSCULAR | 0 refills | Status: DC
  Filled 2022-09-07: qty 0.3, 1d supply, fill #0

## 2022-09-30 NOTE — Patient Instructions (Signed)
Health Maintenance, Male Adopting a healthy lifestyle and getting preventive care are important in promoting health and wellness. Ask your health care provider about: The right schedule for you to have regular tests and exams. Things you can do on your own to prevent diseases and keep yourself healthy. What should I know about diet, weight, and exercise? Eat a healthy diet  Eat a diet that includes plenty of vegetables, fruits, low-fat dairy products, and lean protein. Do not eat a lot of foods that are high in solid fats, added sugars, or sodium. Maintain a healthy weight Body mass index (BMI) is a measurement that can be used to identify possible weight problems. It estimates body fat based on height and weight. Your health care provider can help determine your BMI and help you achieve or maintain a healthy weight. Get regular exercise Get regular exercise. This is one of the most important things you can do for your health. Most adults should: Exercise for at least 150 minutes each week. The exercise should increase your heart rate and make you sweat (moderate-intensity exercise). Do strengthening exercises at least twice a week. This is in addition to the moderate-intensity exercise. Spend less time sitting. Even light physical activity can be beneficial. Watch cholesterol and blood lipids Have your blood tested for lipids and cholesterol at 78 years of age, then have this test every 5 years. You may need to have your cholesterol levels checked more often if: Your lipid or cholesterol levels are high. You are older than 78 years of age. You are at high risk for heart disease. What should I know about cancer screening? Many types of cancers can be detected early and may often be prevented. Depending on your health history and family history, you may need to have cancer screening at various ages. This may include screening for: Colorectal cancer. Prostate cancer. Skin cancer. Lung  cancer. What should I know about heart disease, diabetes, and high blood pressure? Blood pressure and heart disease High blood pressure causes heart disease and increases the risk of stroke. This is more likely to develop in people who have high blood pressure readings or are overweight. Talk with your health care provider about your target blood pressure readings. Have your blood pressure checked: Every 3-5 years if you are 18-39 years of age. Every year if you are 40 years old or older. If you are between the ages of 65 and 75 and are a current or former smoker, ask your health care provider if you should have a one-time screening for abdominal aortic aneurysm (AAA). Diabetes Have regular diabetes screenings. This checks your fasting blood sugar level. Have the screening done: Once every three years after age 45 if you are at a normal weight and have a low risk for diabetes. More often and at a younger age if you are overweight or have a high risk for diabetes. What should I know about preventing infection? Hepatitis B If you have a higher risk for hepatitis B, you should be screened for this virus. Talk with your health care provider to find out if you are at risk for hepatitis B infection. Hepatitis C Blood testing is recommended for: Everyone born from 1945 through 1965. Anyone with known risk factors for hepatitis C. Sexually transmitted infections (STIs) You should be screened each year for STIs, including gonorrhea and chlamydia, if: You are sexually active and are younger than 78 years of age. You are older than 78 years of age and your   health care provider tells you that you are at risk for this type of infection. Your sexual activity has changed since you were last screened, and you are at increased risk for chlamydia or gonorrhea. Ask your health care provider if you are at risk. Ask your health care provider about whether you are at high risk for HIV. Your health care provider  may recommend a prescription medicine to help prevent HIV infection. If you choose to take medicine to prevent HIV, you should first get tested for HIV. You should then be tested every 3 months for as long as you are taking the medicine. Follow these instructions at home: Alcohol use Do not drink alcohol if your health care provider tells you not to drink. If you drink alcohol: Limit how much you have to 0-2 drinks a day. Know how much alcohol is in your drink. In the [place] one drink equals one 12 oz bottle of beer (355 mL), one 5 oz glass of Keria Widrig (148 mL), or one 1 oz glass of hard liquor (44 mL). Lifestyle Do not use any products that contain nicotine or tobacco. These products include cigarettes, chewing tobacco, and vaping devices, such as e-cigarettes. If you need help quitting, ask your health care provider. Do not use street drugs. Do not share needles. Ask your health care provider for help if you need support or information about quitting drugs. General instructions Schedule regular health, dental, and eye exams. Stay current with your vaccines. Tell your health care provider if: You often feel depressed. You have ever been abused or do not feel safe at home. Summary Adopting a healthy lifestyle and getting preventive care are important in promoting health and wellness. Follow your health care provider's instructions about healthy diet, exercising, and getting tested or screened for diseases. Follow your health care provider's instructions on monitoring your cholesterol and blood pressure. This information is not intended to replace advice given to you by your health care provider. Make sure you discuss any questions you have with your health care provider. Document Revised: 03/27/2021 Document Reviewed: 03/27/2021 Elsevier Patient Education  2023 [employer]  

## 2022-09-30 NOTE — Progress Notes (Unsigned)
Subjective:   Dan Dudley is a 76 y.o. male who presents for Medicare Annual/Subsequent preventive examination. I connected with  CEYLON ARENSON on 10/01/22 by a audio enabled telemedicine application and verified that I am speaking with the correct person using two identifiers.  Patient Location: Home  Provider Location: Home Office  I discussed the limitations of evaluation and management by telemedicine. The patient expressed understanding and agreed to proceed.  Review of Systems    Deferred to PCP Cardiac Risk Factors include: advanced age (>30mn, >>71women);dyslipidemia;hypertension;male gender     Objective:    Today's Vitals   10/01/22 1007  PainSc: 3    There is no height or weight on file to calculate BMI.     10/01/2022   10:15 AM 07/28/2021    5:23 PM 08/04/2019   12:04 PM 06/30/2018    9:27 AM 01/22/2018    3:25 PM 10/16/2017    8:04 AM 07/15/2017   11:14 AM  Advanced Directives  Does Patient Have a Medical Advance Directive? Yes Yes Yes Yes Yes Yes Yes  Type of AParamedicof [place] will [place]Living will;Out of facility DNR (pink MOST or yellow form) HInver Grove HeightsLiving will HZacharyLiving will HMore[place]Living will [place]ansomvilleLiving will Living will;Healthcare Power of Attorney  Does patient want to make changes to medical advance directive? No - Patient declined No - Patient declined     No - Patient declined  Copy of HGurleyin Chart? No - copy requested Yes - validated most recent copy scanned in chart (See row information) No - copy requested No - copy requested  No - copy requested No - copy requested    Current Medications (verified) Outpatient Encounter Medications as of 10/01/2022  Medication Sig   acetaminophen (TYLENOL) 325 MG tablet Take 650 mg by mouth every 6 (six) hours as needed.    aspirin EC 325 MG tablet Take 325 mg by mouth as needed.   calcium carbonate (TUMS - DOSED IN MG ELEMENTAL CALCIUM) 500 MG chewable tablet Chew 1 tablet by mouth daily as needed for heartburn.   COVID-19 mRNA vaccine 2023-2024 (COMIRNATY) syringe Inject into the muscle.   latanoprost (XALATAN) 0.005 % ophthalmic solution 1 drop at bedtime.   mirabegron ER (MYRBETRIQ) 50 MG TB24 tablet Take 1 tablet (50 mg total) by mouth daily.   omeprazole (PRILOSEC) 20 MG capsule Take 20 mg by mouth every morning.   RSV vaccine recomb adjuvanted (AREXVY) 120 MCG/0.5ML injection Inject into the muscle.   tamsulosin (FLOMAX) 0.4 MG CAPS capsule Take 1 capsule (0.4 mg total) by mouth daily after supper. (Patient taking differently: Take by mouth 2 (two) times daily. Takes 2 pills once daily)   pravastatin (PRAVACHOL) 20 MG tablet Take 1 tablet (20 mg total) by mouth daily.   triamcinolone cream (KENALOG) 0.1 % Apply 1 application topically 2 (two) times daily.   No facility-administered encounter medications on file as of 10/01/2022.    Allergies (verified) Lipitor [atorvastatin]   History: Past Medical History:  Diagnosis Date   GERD (gastroesophageal reflux disease)    Headache(784.0)    Heart murmur    "years ago"- 1990s   Hemorrhoids    internal/ external   Hyperlipidemia    per pt watching diet   Hypertension per pt thinks its white coat because at home normal bp   per pt currently no medication as  trial w/ pcp ok (was taking micardis '40mg'$ )   Nocturia more than twice per night    Nodular hyperplasia of prostate gland    Prostate cancer [facility]) urologist-  dr budzyn/  oncologist-  dr Tammi Klippel   dx 07-15-2017 (bx)-- Stage T1c, Gleason 4+3, PSA 9.06, vol 80.2cc-- planned radiationtherpy   Past Surgical History:  Procedure Laterality Date   COLONOSCOPY  last one 08-15-2010   GOLD SEED IMPLANT N/A 10/16/2017   Procedure: GOLD SEED IMPLANT;  Surgeon: Nickie Retort, MD;  Location: [facility];  Service: Urology;  Laterality: N/A;   HEMANGIOMA EXCISION  09/25/2004   elliptical excision left lower lip   INGUINAL HERNIA REPAIR Bilateral left 10-25-2000  dr Hassell Done  right [place] 07/15/2017   Procedure: BIOPSY TRANSRECTAL ULTRASONIC PROSTATE (TUBP);  Surgeon: Nickie Retort, MD;  Location: [facility];  Service: Urology;  Laterality: N/A;   SPACE OAR INSTILLATION N/A 10/16/2017   Procedure: SPACE OAR INSTILLATION;  Surgeon: Nickie Retort, MD;  Location: [facility];  Service: Urology;  Laterality: N/A;   Family History  Problem Relation Age of Onset   Cancer Mother        unknown   Heart disease Father        age 75, nonsmoker, heavy drinker   COPD Other    Colon cancer Neg Hx    Esophageal cancer Neg Hx    Rectal cancer Neg Hx    Stomach cancer Neg Hx    Lung disease Neg Hx    Social History   Socioeconomic History   Marital status: Married    Spouse name: Verdis Frederickson   Number of children: 2   Years of education: Not on file   Highest education level: Not on file  Occupational History   Occupation: Retired    Fish farm manager: SELF-EMPLOYED  Tobacco Use   Smoking status: Former    Packs/day: 0.25    Years: 8.00    Total pack years: 2.00    Types: Cigarettes    Quit date: 11/19/1968    Years since quitting: 53.9   Smokeless tobacco: Never  Vaping Use   Vaping Use: Never used  Substance and Sexual Activity   Alcohol use: Not Currently   Drug use: No   Sexual activity: Yes  Other Topics Concern   Not on file  Social History Narrative   Married [place] 2 kids. Randall Hiss lives in [place], [place] -bachelor and Radio producer; Katharine Look in [place] with a boy and girl (21 in 2015).    Missy-toy poodle (9).       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, operational of Radio broadcast assistant Strain: [place]  (10/01/2022)   Overall Financial  Resource Strain (CARDIA)    Difficulty of Paying Living Expenses: Not hard at all  Food Insecurity: No Food Insecurity (10/01/2022)   Hunger Vital Sign    Worried About Running Out of Food in the Last Year: Never true    [place] in the Last Year: Never true  Transportation Needs: No Transportation Needs (10/01/2022)   PRAPARE - Hydrologist (Medical): No    Lack of Transportation (Non-Medical): No  Physical Activity: Sufficiently Active (10/01/2022)   Exercise Vital Sign    Days of Exercise per Week: 5 days    Minutes of  Exercise per Session: 40 min  Stress: No Stress Concern Present (10/01/2022)   [place]    Feeling of Stress : Not at all  Social Connections: Moderately Isolated (10/01/2022)   Social Connection and Isolation Panel [NHANES]    Frequency of Communication with Friends and Family: More than three times a week    Frequency of Social Gatherings with Friends and Family: More than three times a week    Attends Religious Services: Never    Marine scientist or Organizations: No    Attends Music therapist: Never    Marital Status: Married    Tobacco Counseling Counseling given: Not Answered   Clinical Intake:  Pre-visit preparation completed: Yes  Pain : 0-10 Pain Score: 3  Pain Type: Chronic pain Pain Location: Back Pain Descriptors / Indicators: Aching, Discomfort, Dull Pain Relieving Factors: medication  Pain Relieving Factors: medication  Nutritional Status: BMI of 19-24  Normal Nutritional Risks: None Diabetes: No  How often do you need to have someone help you when you read instructions, pamphlets, or other written materials from your doctor or pharmacy?: 1 - Never What is the last grade level you completed in school?: 12th  Diabetic?No  Interpreter Needed?: No  Information entered by :: Emelia Loron RN   Activities of Daily  Living    10/01/2022   10:12 AM  In your present state of health, do you have any difficulty performing the following activities:  Hearing? 0  Vision? 0  Difficulty concentrating or making decisions? 0  Walking or climbing stairs? 0  Dressing or bathing? 0  Doing errands, shopping? 0  Preparing Food and eating ? N  Using the Toilet? N  In the past six months, have you accidently leaked urine? N  Do you have problems with loss of bowel control? N  Managing your Medications? N  Managing your Finances? N  Housekeeping or managing your Housekeeping? N    Patient Care Team: Janith Lima, MD as PCP - General (Internal Medicine) Nickie Retort, MD (Inactive) (Urology)  Indicate any recent Medical Services you may have received from other than [facility] providers in the past year (date may be approximate).     Assessment:   This is a routine wellness examination for Dan Dudley.  Hearing/Vision screen No results found.  Dietary issues and exercise activities discussed: Current Exercise Habits: Home exercise routine, Type of exercise: walking, Time (Minutes): 45, Frequency (Times/Week): 4, Weekly Exercise (Minutes/Week): 180, Intensity: Mild, Exercise limited by: orthopedic condition(s)   Goals Addressed             This Visit's Progress    Patient Stated       Maintain my current health status.      Depression Screen    10/01/2022   10:35 AM 03/21/2022    9:39 AM 07/28/2021    5:19 PM 08/04/2019   12:05 PM 06/30/2018    9:28 AM 01/22/2018    3:25 PM 08/19/2017   10:55 AM  PHQ 2/9 Scores  PHQ - 2 Score 0 0 0 0 1 1 0  PHQ- 9 Score     4      Fall Risk    10/01/2022   10:15 AM 03/21/2022    9:39 AM 07/28/2021    5:12 PM 08/04/2019   12:05 PM 06/30/2018    9:28 AM  Fall Risk   Falls in the past year? 0 0 0 0  No  Number falls in past yr: 0 0 0 0   Injury with Fall? 0 0 0 0   Risk for fall due to : No Fall Risks No Fall Risks     Follow up Falls evaluation completed  Falls evaluation completed       [place]:  Any stairs in or around the home? Yes  If so, are there any without handrails? Yes  Home free of loose throw rugs in walkways, pet beds, electrical cords, etc? Yes  Adequate lighting in your home to reduce risk of falls? Yes   ASSISTIVE DEVICES UTILIZED TO PREVENT FALLS:  Life alert? No  Use of a cane, walker or w/c? No  Grab bars in the bathroom? Yes  Shower chair or bench in shower? No  Elevated toilet seat or a handicapped toilet? No   Cognitive Function:        10/01/2022   10:16 AM 07/28/2021    5:21 PM  6CIT Screen  What Year? 0 points 0 points  What month? 0 points 0 points  What time? 0 points 0 points  Count back from 20 0 points 0 points  Months in reverse 0 points 0 points  Repeat phrase 0 points 0 points  Total Score 0 points 0 points    Immunizations Immunization History  Administered Date(s) Administered   COVID-19, mRNA, vaccine(Comirnaty)12 years and older 09/07/2022   Fluad Quad(high Dose 65+) 07/15/2019, 08/03/2020, 08/06/2022   Influenza Split 10/04/2011, 08/19/2012   Influenza Whole 08/09/2010   Influenza, High Dose Seasonal PF 08/07/2016, 08/13/2017, 08/13/2018   Influenza,inj,Quad PF,6+ Mos 08/14/2013, 08/13/2014, 08/17/2015, 08/07/2021   Moderna Covid-19 Vaccine Bivalent Booster 43yr & up 09/22/2021   Moderna SARS-COV2 Booster Vaccination 09/13/2020, 06/04/2021   Moderna Sars-Covid-2 Vaccination 12/15/2019, 01/12/2020   Pneumococcal Conjugate-13 10/19/2013   Pneumococcal Polysaccharide-23 08/17/2015, 10/16/2021   Respiratory Syncytial Virus Vaccine,Recomb Aduvanted(Arexvy) 08/06/2022   Td 11/19/2001   Tdap 09/25/2012   Zoster Recombinat (Shingrix) 08/14/2019, 10/19/2019   Zoster, Live 05/14/2011    TDAP status: Due, Education has been provided regarding the importance of this vaccine. Advised may receive this vaccine at local pharmacy or Health Dept. Aware to  provide a copy of the vaccination record if obtained from local pharmacy or Health Dept. Verbalized acceptance and understanding.  Flu Vaccine status: Up to date  Pneumococcal vaccine status: Up to date  Covid-19 vaccine status: Completed vaccines  Qualifies for Shingles Vaccine? Yes   Zostavax completed No   Shingrix Completed?: Yes  Screening Tests Health Maintenance  Topic Date Due   COVID-19 Vaccine (4 - Moderna risk series) 11/17/2021   TETANUS/TDAP  09/25/2022   Medicare Annual Wellness (AWV)  10/02/2023   Pneumonia Vaccine 76 Years old  Completed   INFLUENZA VACCINE  Completed   Hepatitis C Screening  Completed   Zoster Vaccines- Shingrix  Completed   HPV VACCINES  Aged Out   COLONOSCOPY (Pts 45-455yrInsurance coverage will need to be confirmed)  Discontinued    Health Maintenance  Health Maintenance Due  Topic Date Due   COVID-19 Vaccine (4 - Moderna risk series) 11/17/2021   TETANUS/TDAP  09/25/2022    Colorectal cancer screening: Type of screening: Colonoscopy. Completed 08/15/10. Repeat every never years  Lung Cancer Screening: (Low Dose CT Chest recommended if Age 78-80ears, 30 pack-year currently smoking OR have quit w/in 15years.) does not qualify.   Additional Screening:  Hepatitis C Screening: does qualify; Completed 08/17/15  Vision  Screening: Recommended annual ophthalmology exams for early detection of glaucoma and other disorders of the eye. Is the patient up to date with their annual eye exam?  Yes  Who is the provider or what is the name of the office in which the patient attends annual eye exams? Dr. Eulas Post If pt is not established with a provider, would they like to be referred to a provider to establish care?  N/A .   Dental Screening: Recommended annual dental exams for proper oral hygiene  Community Resource Referral / Chronic Care Management: CRR required this visit?  No   CCM required this visit?  No      Plan:     I have  personally reviewed and noted the following in the patient's chart:   Medical and social history Use of alcohol, tobacco or illicit drugs  Current medications and supplements including opioid prescriptions. Patient is not currently taking opioid prescriptions. Functional ability and status Nutritional status Physical activity Advanced directives List of other physicians Hospitalizations, surgeries, and ER visits in previous 12 months Vitals Screenings to include cognitive, depression, and falls Referrals and appointments  In addition, I have reviewed and discussed with patient certain preventive protocols, quality metrics, and best practice recommendations. A written personalized care plan for preventive services as well as general preventive health recommendations were provided to patient.     Michiel Cowboy, RN   10/01/2022   Nurse Notes:  Mr. Garfinkle , Thank you for taking time to come for your Medicare Wellness Visit. I appreciate your ongoing commitment to your health goals. Please review the following plan we discussed and let me know if I can assist you in the future.   These are the goals we discussed:  Goals      Patient Stated     Stay as healthy and as independent as possible.     Patient Stated     Maintain my current health status.        This is a list of the screening recommended for you and due dates:  Health Maintenance  Topic Date Due   COVID-19 Vaccine (4 - Moderna risk series) 11/17/2021   Tetanus Vaccine  09/25/2022   Medicare Annual Wellness Visit  10/02/2023   Pneumonia Vaccine  Completed   Flu Shot  Completed   Hepatitis C Screening: USPSTF Recommendation to screen - Ages 18-79 yo.  Completed   Zoster (Shingles) Vaccine  Completed   HPV Vaccine  Aged Out   Colon Cancer Screening  Discontinued

## 2022-10-01 ENCOUNTER — Ambulatory Visit (INDEPENDENT_AMBULATORY_CARE_PROVIDER_SITE_OTHER): Payer: Medicare HMO | Admitting: *Deleted

## 2022-10-01 DIAGNOSIS — Z0000 Encounter for general adult medical examination without abnormal findings: Secondary | ICD-10-CM

## 2022-10-03 ENCOUNTER — Ambulatory Visit (INDEPENDENT_AMBULATORY_CARE_PROVIDER_SITE_OTHER): Payer: Medicare HMO | Admitting: Internal Medicine

## 2022-10-03 ENCOUNTER — Encounter: Payer: Self-pay | Admitting: Internal Medicine

## 2022-10-03 ENCOUNTER — Other Ambulatory Visit (HOSPITAL_BASED_OUTPATIENT_CLINIC_OR_DEPARTMENT_OTHER): Payer: Self-pay

## 2022-10-03 VITALS — BP 118/80 | HR 76 | Temp 98.1°F | Ht 70.0 in | Wt 175.1 lb

## 2022-10-03 DIAGNOSIS — K2100 Gastro-esophageal reflux disease with esophagitis, without bleeding: Secondary | ICD-10-CM

## 2022-10-03 DIAGNOSIS — E559 Vitamin D deficiency, unspecified: Secondary | ICD-10-CM | POA: Diagnosis not present

## 2022-10-03 DIAGNOSIS — Z0001 Encounter for general adult medical examination with abnormal findings: Secondary | ICD-10-CM | POA: Diagnosis not present

## 2022-10-03 DIAGNOSIS — I10 Essential (primary) hypertension: Secondary | ICD-10-CM

## 2022-10-03 DIAGNOSIS — E785 Hyperlipidemia, unspecified: Secondary | ICD-10-CM

## 2022-10-03 LAB — LIPID PANEL
Cholesterol: 206 mg/dL — ABNORMAL HIGH (ref 0–200)
HDL: 72.4 mg/dL (ref >39.00)
LDL Cholesterol: 122 mg/dL — ABNORMAL HIGH (ref 0–99)
NonHDL: 133.71
Total CHOL/HDL Ratio: 3
Triglycerides: 58 mg/dL (ref 0.0–149.0)
VLDL: 11.6 mg/dL (ref 0.0–40.0)

## 2022-10-03 LAB — CBC WITH DIFFERENTIAL/PLATELET
Basophils Absolute: 0.1 10*3/uL (ref 0.0–0.1)
Basophils Relative: 1 % (ref 0.0–3.0)
Eosinophils Absolute: 0.1 10*3/uL (ref 0.0–0.7)
Eosinophils Relative: 2.1 % (ref 0.0–5.0)
HCT: 45.2 % (ref 39.0–52.0)
Hemoglobin: 15.3 g/dL (ref 13.0–17.0)
Lymphocytes Relative: 23.6 % (ref 12.0–46.0)
Lymphs Abs: 1.3 10*3/uL (ref 0.7–4.0)
MCHC: 33.8 g/dL (ref 30.0–36.0)
MCV: 91.5 fl (ref 78.0–100.0)
Monocytes Absolute: 0.5 10*3/uL (ref 0.1–1.0)
Monocytes Relative: 8.7 % (ref 3.0–12.0)
Neutro Abs: 3.6 10*3/uL (ref 1.4–7.7)
Neutrophils Relative %: 64.6 % (ref 43.0–77.0)
Platelets: 346 10*3/uL (ref 150.0–400.0)
RBC: 4.94 Mil/uL (ref 4.22–5.81)
RDW: 15.1 % (ref 11.5–15.5)
WBC: 5.5 10*3/uL (ref 4.0–10.5)

## 2022-10-03 LAB — HEPATIC FUNCTION PANEL
ALT: 14 U/L (ref 0–53)
AST: 17 U/L (ref 0–37)
Albumin: 4.6 g/dL (ref 3.5–5.2)
Alkaline Phosphatase: 55 U/L (ref 39–117)
Bilirubin, Direct: 0.1 mg/dL (ref 0.0–0.3)
Total Bilirubin: 0.8 mg/dL (ref 0.2–1.2)
Total Protein: 6.9 g/dL (ref 6.0–8.3)

## 2022-10-03 LAB — BASIC METABOLIC PANEL
BUN: 15 mg/dL (ref 6–23)
CO2: 30 mEq/L (ref 19–32)
Calcium: 9.7 mg/dL (ref 8.4–10.5)
Chloride: 104 mEq/L (ref 96–112)
Creatinine, Ser: 0.8 mg/dL (ref 0.40–1.50)
GFR: 85.09 mL/min (ref >60.00)
Glucose, Bld: 98 mg/dL (ref 70–99)
Potassium: 4 mEq/L (ref 3.5–5.1)
Sodium: 138 mEq/L (ref 135–145)

## 2022-10-03 LAB — TSH: TSH: 1.29 u[IU]/mL (ref 0.35–5.50)

## 2022-10-03 MED ORDER — TETANUS-DIPHTH-ACELL PERTUSSIS 5-2.5-18.5 LF-MCG/0.5 IM SUSY: PREFILLED_SYRINGE | INTRAMUSCULAR | 0 refills | Status: DC

## 2022-10-03 NOTE — Patient Instructions (Signed)
Health Maintenance, Male Adopting a healthy lifestyle and getting preventive care are important in promoting health and wellness. Ask your health care provider about: The right schedule for you to have regular tests and exams. Things you can do on your own to prevent diseases and keep yourself healthy. What should I know about diet, weight, and exercise? Eat a healthy diet  Eat a diet that includes plenty of vegetables, fruits, low-fat dairy products, and lean protein. Do not eat a lot of foods that are high in solid fats, added sugars, or sodium. Maintain a healthy weight Body mass index (BMI) is a measurement that can be used to identify possible weight problems. It estimates body fat based on height and weight. Your health care provider can help determine your BMI and help you achieve or maintain a healthy weight. Get regular exercise Get regular exercise. This is one of the most important things you can do for your health. Most adults should: Exercise for at least 150 minutes each week. The exercise should increase your heart rate and make you sweat (moderate-intensity exercise). Do strengthening exercises at least twice a week. This is in addition to the moderate-intensity exercise. Spend less time sitting. Even light physical activity can be beneficial. Watch cholesterol and blood lipids Have your blood tested for lipids and cholesterol at 78 years of age, then have this test every 5 years. You may need to have your cholesterol levels checked more often if: Your lipid or cholesterol levels are high. You are older than 78 years of age. You are at high risk for heart disease. What should I know about cancer screening? Many types of cancers can be detected early and may often be prevented. Depending on your health history and family history, you may need to have cancer screening at various ages. This may include screening for: Colorectal cancer. Prostate cancer. Skin cancer. Lung  cancer. What should I know about heart disease, diabetes, and high blood pressure? Blood pressure and heart disease High blood pressure causes heart disease and increases the risk of stroke. This is more likely to develop in people who have high blood pressure readings or are overweight. Talk with your health care provider about your target blood pressure readings. Have your blood pressure checked: Every 3-5 years if you are 18-39 years of age. Every year if you are 40 years old or older. If you are between the ages of 65 and 75 and are a current or former smoker, ask your health care provider if you should have a one-time screening for abdominal aortic aneurysm (AAA). Diabetes Have regular diabetes screenings. This checks your fasting blood sugar level. Have the screening done: Once every three years after age 45 if you are at a normal weight and have a low risk for diabetes. More often and at a younger age if you are overweight or have a high risk for diabetes. What should I know about preventing infection? Hepatitis B If you have a higher risk for hepatitis B, you should be screened for this virus. Talk with your health care provider to find out if you are at risk for hepatitis B infection. Hepatitis C Blood testing is recommended for: Everyone born from 1945 through 1965. Anyone with known risk factors for hepatitis C. Sexually transmitted infections (STIs) You should be screened each year for STIs, including gonorrhea and chlamydia, if: You are sexually active and are younger than 78 years of age. You are older than 78 years of age and your   health care provider tells you that you are at risk for this type of infection. Your sexual activity has changed since you were last screened, and you are at increased risk for chlamydia or gonorrhea. Ask your health care provider if you are at risk. Ask your health care provider about whether you are at high risk for HIV. Your health care provider  may recommend a prescription medicine to help prevent HIV infection. If you choose to take medicine to prevent HIV, you should first get tested for HIV. You should then be tested every 3 months for as long as you are taking the medicine. Follow these instructions at home: Alcohol use Do not drink alcohol if your health care provider tells you not to drink. If you drink alcohol: Limit how much you have to 0-2 drinks a day. Know how much alcohol is in your drink. In the [place] one drink equals one 12 oz bottle of beer (355 mL), one 5 oz glass of wine (148 mL), or one 1 oz glass of hard liquor (44 mL). Lifestyle Do not use any products that contain nicotine or tobacco. These products include cigarettes, chewing tobacco, and vaping devices, such as e-cigarettes. If you need help quitting, ask your health care provider. Do not use street drugs. Do not share needles. Ask your health care provider for help if you need support or information about quitting drugs. General instructions Schedule regular health, dental, and eye exams. Stay current with your vaccines. Tell your health care provider if: You often feel depressed. You have ever been abused or do not feel safe at home. Summary Adopting a healthy lifestyle and getting preventive care are important in promoting health and wellness. Follow your health care provider's instructions about healthy diet, exercising, and getting tested or screened for diseases. Follow your health care provider's instructions on monitoring your cholesterol and blood pressure. This information is not intended to replace advice given to you by your health care provider. Make sure you discuss any questions you have with your health care provider. Document Revised: 03/27/2021 Document Reviewed: 03/27/2021 Elsevier Patient Education  2023 [employer]  

## 2022-10-03 NOTE — Progress Notes (Unsigned)
Subjective:  Patient ID: Dan Dudley, male    DOB: [birth year]  Age: 76 y.o. MRN: [id number]  CC: Annual Exam, Hyperlipidemia, and Gastroesophageal Reflux   HPI Dan Dudley presents for a CPX and f/up -  He has decided not to take a statin. He is active and denies DOE, CP, SOB, edema.  Outpatient Medications Prior to Visit  Medication Sig Dispense Refill   acetaminophen (TYLENOL) 325 MG tablet Take 650 mg by mouth every 6 (six) hours as needed.     aspirin EC 325 MG tablet Take 325 mg by mouth as needed.     calcium carbonate (TUMS - DOSED IN MG ELEMENTAL CALCIUM) 500 MG chewable tablet Chew 1 tablet by mouth daily as needed for heartburn.     COVID-19 mRNA vaccine 2023-2024 (COMIRNATY) syringe Inject into the muscle. 0.3 mL 0   latanoprost (XALATAN) 0.005 % ophthalmic solution 1 drop at bedtime.     mirabegron ER (MYRBETRIQ) 50 MG TB24 tablet Take 1 tablet (50 mg total) by mouth daily. 30 tablet 4   omeprazole (PRILOSEC) 20 MG capsule Take 20 mg by mouth every morning.     RSV vaccine recomb adjuvanted (AREXVY) 120 MCG/0.5ML injection Inject into the muscle. 0.5 mL 0   tamsulosin (FLOMAX) 0.4 MG CAPS capsule Take 1 capsule (0.4 mg total) by mouth daily after supper. (Patient taking differently: Take by mouth 2 (two) times daily. Takes 2 pills once daily) 30 capsule 5   triamcinolone cream (KENALOG) 0.1 % Apply 1 application topically 2 (two) times daily. (Patient not taking: Reported on 10/03/2022) 45 g 0   pravastatin (PRAVACHOL) 20 MG tablet Take 1 tablet (20 mg total) by mouth daily. (Patient not taking: Reported on 10/03/2022) 90 tablet 1   No facility-administered medications prior to visit.    ROS Review of Systems  Constitutional: Negative.  Negative for diaphoresis, fatigue and unexpected weight change.  HENT: Negative.  Negative for trouble swallowing.   Eyes: Negative.   Respiratory: Negative.  Negative for cough, choking, shortness of breath and  wheezing.   Cardiovascular:  Negative for chest pain, palpitations and leg swelling.  Gastrointestinal:  Negative for abdominal pain, constipation, diarrhea, nausea and vomiting.  Genitourinary:  Positive for frequency. Negative for difficulty urinating, dysuria and hematuria.  Musculoskeletal: Negative.  Negative for arthralgias and myalgias.  Skin: Negative.   Allergic/Immunologic: Negative.   Neurological: Negative.  Negative for dizziness and weakness.  Hematological:  Negative for adenopathy. Does not bruise/bleed easily.  Psychiatric/Behavioral: Negative.      Objective:  BP 118/80   Pulse 76   Temp 98.1 F (36.7 C)   Ht '5\' 10"'$  (1.778 m)   Wt 175 lb 2 oz (79.4 kg)   SpO2 94%   BMI 25.13 kg/m   BP Readings from Last 3 Encounters:  10/03/22 118/80  03/21/22 122/84  10/24/21 112/80    Wt Readings from Last 3 Encounters:  10/03/22 175 lb 2 oz (79.4 kg)  03/21/22 175 lb (79.4 kg)  10/24/21 169 lb (76.7 kg)    Physical Exam Vitals reviewed.  Constitutional:      Appearance: Normal appearance.  HENT:     Mouth/Throat:     Mouth: Mucous membranes are moist.  Eyes:     General: No scleral icterus.    Pupils: Pupils are equal, round, and reactive to light.  Cardiovascular:     Rate and Rhythm: Normal rate and regular rhythm.     Heart sounds: No murmur  heard. Pulmonary:     Effort: Pulmonary effort is normal.     Breath sounds: No stridor. No wheezing, rhonchi or rales.  Abdominal:     General: Abdomen is flat.     Palpations: There is no mass.     Tenderness: There is no abdominal tenderness. There is no guarding.     Hernia: No hernia is present.  Musculoskeletal:        General: Normal range of motion.     Cervical back: Neck supple.     Right lower leg: No edema.     Left lower leg: No edema.  Lymphadenopathy:     Cervical: No cervical adenopathy.  Skin:    General: Skin is warm and dry.  Neurological:     General: No focal deficit present.      Mental Status: He is alert.  Psychiatric:        Mood and Affect: Mood normal.        Behavior: Behavior normal.     Lab Results  Component Value Date   WBC 5.5 10/03/2022   HGB 15.3 10/03/2022   HCT 45.2 10/03/2022   PLT 346.0 10/03/2022   GLUCOSE 98 10/03/2022   CHOL 206 (H) 10/03/2022   TRIG 58.0 10/03/2022   HDL 72.40 10/03/2022   LDLDIRECT 134.3 03/27/2010   LDLCALC 122 (H) 10/03/2022   ALT 14 10/03/2022   AST 17 10/03/2022   NA 138 10/03/2022   K 4.0 10/03/2022   CL 104 10/03/2022   CREATININE 0.80 10/03/2022   BUN 15 10/03/2022   CO2 30 10/03/2022   TSH 1.29 10/03/2022   PSA 0.1 05/10/2022   INR 1.00 10/08/2017    MM DIAG BREAST TOMO BILATERAL  Result Date: 04/04/2022 CLINICAL DATA:  Patient has a small lesion at the base of the left nipple which was previously larger, whitish coloration. No nipple discharge. No other symptoms. EXAM: DIGITAL DIAGNOSTIC BILATERAL MAMMOGRAM WITH TOMOSYNTHESIS AND CAD TECHNIQUE: Bilateral digital diagnostic mammography and breast tomosynthesis was performed. The images were evaluated with computer-aided detection. COMPARISON:  Previous exam(s). ACR Breast Density Category a: The breast tissue is almost entirely fatty. FINDINGS: There are no masses, areas of architectural distortion, areas of significant asymmetry or suspicious calcifications. Physical examination, there is a small, whitish papule the lateral base of the nipple. Patient has a photograph of this obtained proximally 2 months ago, where is larger. IMPRESSION: 1. No evidence of breast malignancy. 2. Small benign skin lesion, consistent with an obstructed gland, at the base of the left nipple, which has decreased in size in the last month. RECOMMENDATION: 1. Clinical follow-up for the left nipple skin lesion if this increases in size or becomes painful. I have discussed the findings and recommendations with the patient. If applicable, a reminder letter will be sent to the patient  regarding the next appointment. BI-RADS CATEGORY  2: Benign. Electronically Signed   By: Lajean Manes M.D.   On: 04/04/2022 15:19   Assessment & Plan:   Dan Dudley was seen today for annual exam, hyperlipidemia and gastroesophageal reflux.  Diagnoses and all orders for this visit:  Essential hypertension- His BP is well controlled. -     CBC with Differential/Platelet; Future -     Hepatic function panel; Future -     TSH; Future -     Basic metabolic panel; Future -     Basic metabolic panel -     TSH -     Hepatic function panel -  CBC with Differential/Platelet  Hyperlipidemia with target LDL less than 130- He is not willing to take a statin. -     Lipid panel; Future -     Hepatic function panel; Future -     TSH; Future -     TSH -     Hepatic function panel -     Lipid panel  Encounter for general adult medical examination with abnormal findings- Exam completed, labs reviewed, cancer screenings are UTD, vaccines are UTD, pt ed was given.  Vitamin D deficiency -     VITAMIN D 25 Hydroxy (Vit-D Deficiency, Fractures); Future  Gastroesophageal reflux disease with esophagitis without hemorrhage- Sx's are well controlled. -     CBC with Differential/Platelet; Future -     CBC with Differential/Platelet   I have discontinued Warren Lindahl. Captain's pravastatin and Tdap. I am also having him maintain his calcium carbonate, aspirin EC, omeprazole, tamsulosin, acetaminophen, mirabegron ER, triamcinolone cream, Arexvy, COVID-19 mRNA vaccine 2023-2024, and latanoprost.  No orders of the defined types were placed in this encounter.    Follow-up: Return in about 6 months (around 04/03/2023).  Scarlette Calico, MD

## 2022-10-05 ENCOUNTER — Other Ambulatory Visit (HOSPITAL_BASED_OUTPATIENT_CLINIC_OR_DEPARTMENT_OTHER): Payer: Self-pay

## 2022-10-05 MED ORDER — TETANUS-DIPHTH-ACELL PERTUSSIS 5-2.5-18.5 LF-MCG/0.5 IM SUSY
PREFILLED_SYRINGE | INTRAMUSCULAR | 0 refills | Status: DC
  Filled 2022-10-05: qty 0.5, 1d supply, fill #0

## 2022-11-01 DIAGNOSIS — C61 Malignant neoplasm of prostate: Secondary | ICD-10-CM | POA: Diagnosis not present

## 2022-11-08 DIAGNOSIS — N401 Enlarged prostate with lower urinary tract symptoms: Secondary | ICD-10-CM | POA: Diagnosis not present

## 2022-11-08 DIAGNOSIS — C61 Malignant neoplasm of prostate: Secondary | ICD-10-CM | POA: Diagnosis not present

## 2022-11-08 DIAGNOSIS — R351 Nocturia: Secondary | ICD-10-CM | POA: Diagnosis not present

## 2022-11-21 DIAGNOSIS — H401111 Primary open-angle glaucoma, right eye, mild stage: Secondary | ICD-10-CM | POA: Diagnosis not present

## 2022-11-21 DIAGNOSIS — H2513 Age-related nuclear cataract, bilateral: Secondary | ICD-10-CM | POA: Diagnosis not present

## 2022-11-21 DIAGNOSIS — H40052 Ocular hypertension, left eye: Secondary | ICD-10-CM | POA: Diagnosis not present

## 2023-01-14 ENCOUNTER — Ambulatory Visit
Admission: RE | Admit: 2023-01-14 | Discharge: 2023-01-14 | Disposition: A | Discharge status: Home / Self Care | Payer: Medicare HMO | Source: Ambulatory Visit | Attending: Nurse Practitioner | Admitting: Nurse Practitioner

## 2023-01-14 VITALS — BP 155/94 | HR 94 | Temp 97.9°F | Resp 17

## 2023-01-14 DIAGNOSIS — Z1152 Encounter for screening for COVID-19: Secondary | ICD-10-CM | POA: Diagnosis not present

## 2023-01-14 DIAGNOSIS — J398 Other specified diseases of upper respiratory tract: Secondary | ICD-10-CM | POA: Diagnosis not present

## 2023-01-14 MED ORDER — BENZONATATE 100 MG PO CAPS: 100.0000 mg | ORAL_CAPSULE | Freq: Three times a day (TID) | ORAL | 0 refills | Status: DC

## 2023-01-14 NOTE — Discharge Instructions (Addendum)
Your COVID is pending. Your results will show in your MyChart. Any positive results will result in a phone call from a nurse with next steps in treatment and recommendations.   We encourage conservative treatment with symptom relief. I recommend to discontinue the Sudafed due the elevation in your blood pressure and start Coricidin HBP for congestion. Continue to hydrate well with water 2-3 liters.  We encourage you to use Tylenol alternating with Ibuprofen for your fever/pain if not contraindicated. (Remember to use as directed do not exceed daily dosing recommendations) We also encourage salt water gargles for your sore throat. You should also consider throat lozenges and chloraseptic spray. Lemon and honey tea. Your cough can be soothed with a cough suppressant. You have been prescribed Benzonatate 100 mg BID for the cough.

## 2023-01-14 NOTE — ED Triage Notes (Signed)
Pt presents with productive cough, clear nasal drainage, and sinus congestion X 7 days.

## 2023-01-14 NOTE — ED Provider Notes (Signed)
[facility]    CSN: [id number] Arrival date & time: 01/14/23  0946      History   Chief Complaint Chief Complaint  Patient presents with   URI    HPI Dan Dudley is a 77 y.o. male.   HPI He is complaining of a productive cough with nasal congestion with clear drainage and occasional cough. This has been going on for 8 days. He reports that his symptoms started with at scratchy throat. He does have a itchy throat. He reports feeling tired. Exposure unknown COVID/Influenza/Strep. His spouse was ill but she has recovered. Denies fever, chills, sore throat, shortness of breath, chest pain, nausea, or diarrhea. The current treatment has been OTC sudafed. Past Medical History:  Diagnosis Date   GERD (gastroesophageal reflux disease)    Headache(784.0)    Heart murmur    "years ago"- 1990s   Hemorrhoids    internal/ external   Hyperlipidemia    per pt watching diet   Hypertension per pt thinks its white coat because at home normal bp   per pt currently no medication as trial w/ pcp ok (was taking micardis '40mg'$ )   Nocturia more than twice per night    Nodular hyperplasia of prostate gland    Prostate cancer [facility]) urologist-  dr Pilar Jarvis  oncologist-  dr Tammi Klippel   dx 07-15-2017 (bx)-- Stage T1c, Gleason 4+3, PSA 9.06, vol 80.2cc-- planned radiationtherpy    Patient Active Problem List   Diagnosis Date Noted   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   Encounter for general adult medical examination with abnormal findings 10/03/2021   Laryngitis, chronic 10/03/2021   Low back pain 07/25/2021   Pain in lower limb 05/14/2020   Vitamin D deficiency 08/12/2019   Pain in left knee 09/25/2018   Hearing loss 04/23/2016   Essential hypertension 04/23/2016   GERD (gastroesophageal reflux disease) 08/13/2014   PTSD (post-traumatic stress disorder) 05/09/2011   Stage 1 adenocarcinoma of prostate ([place]) 04/03/2010   BURSITIS, LEFT SHOULDER 01/31/2010   Hyperlipidemia  with target LDL less than 130 07/22/2007    Past Surgical History:  Procedure Laterality Date   COLONOSCOPY  last one 08-15-2010   GOLD SEED IMPLANT N/A 10/16/2017   Procedure: GOLD SEED IMPLANT;  Surgeon: Nickie Retort, MD;  Location: [facility];  Service: Urology;  Laterality: N/A;   HEMANGIOMA EXCISION  09/25/2004   elliptical excision left lower lip   INGUINAL HERNIA REPAIR Bilateral left 10-25-2000  dr Hassell Done  right [place] 07/15/2017   Procedure: BIOPSY TRANSRECTAL ULTRASONIC PROSTATE (TUBP);  Surgeon: Nickie Retort, MD;  Location: [facility];  Service: Urology;  Laterality: N/A;   SPACE OAR INSTILLATION N/A 10/16/2017   Procedure: SPACE OAR INSTILLATION;  Surgeon: Nickie Retort, MD;  Location: [facility];  Service: Urology;  Laterality: N/A;       Home Medications    Prior to Admission medications   Medication Sig Start Date End Date Taking? Authorizing Provider  benzonatate (TESSALON) 100 MG capsule Take 1 capsule (100 mg total) by mouth every 8 (eight) hours. 01/14/23  Yes Vevelyn Francois, NP  acetaminophen (TYLENOL) 325 MG tablet Take 650 mg by mouth every 6 (six) hours as needed.    [provider]  aspirin EC 325 MG tablet Take 325 mg by mouth as needed.    [provider]  calcium carbonate (TUMS - DOSED IN MG ELEMENTAL CALCIUM) 500  MG chewable tablet Chew 1 tablet by mouth daily as needed for heartburn.    [provider]  COVID-19 mRNA vaccine [phone] (COMIRNATY) syringe Inject into the muscle. 09/07/22   Carlyle Basques, MD  latanoprost (XALATAN) 0.005 % ophthalmic solution 1 drop at bedtime. 08/24/22   [provider]  mirabegron ER (MYRBETRIQ) 50 MG TB24 tablet Take 1 tablet (50 mg total) by mouth daily. 02/22/18   Bruning, Ashlyn, PA-C  omeprazole (PRILOSEC) 20 MG capsule Take 20 mg by mouth every morning.    [provider]  RSV  vaccine recomb adjuvanted (AREXVY) 120 MCG/0.5ML injection Inject into the muscle. 08/06/22   Carlyle Basques, MD  tamsulosin (FLOMAX) 0.4 MG CAPS capsule Take 1 capsule (0.4 mg total) by mouth daily after supper. Patient taking differently: Take by mouth 2 (two) times daily. Takes 2 pills once daily 11/07/17   Tyler Pita, MD  Tdap Durwin Reges) 5-2.5-18.5 LF-MCG/0.5 injection Inject into the muscle. 10/05/22   Carlyle Basques, MD  triamcinolone cream (KENALOG) 0.1 % Apply 1 application topically 2 (two) times daily. Patient not taking: Reported on 10/03/2022 04/19/21   Janith Lima, PA-C    Family History Family History  Problem Relation Age of Onset   Cancer Mother        unknown   Heart disease Father        age 79, nonsmoker, heavy drinker   COPD Other    Colon cancer Neg Hx    Esophageal cancer Neg Hx    Rectal cancer Neg Hx    Stomach cancer Neg Hx    Lung disease Neg Hx     Social History Social History   Tobacco Use   Smoking status: Former    Packs/day: 0.25    Years: 8.00    Total pack years: 2.00    Types: Cigarettes    Quit date: 11/19/1968    Years since quitting: 54.1   Smokeless tobacco: Never  Vaping Use   Vaping Use: Never used  Substance Use Topics   Alcohol use: Not Currently   Drug use: No     Allergies   Lipitor [atorvastatin]   Review of Systems Review of Systems   Physical Exam Triage Vital Signs ED Triage Vitals [01/14/23 1012]  Enc Vitals Group     BP (!) 155/94     Pulse Rate 94     Resp 17     Temp 97.9 F (36.6 C)     Temp Source Oral     SpO2 93 %     Weight      Height      Head Circumference      Peak Flow      Pain Score 4     Pain Loc      Pain Edu?      Excl. in [place]?    No data found.  Updated Vital Signs BP (!) 155/94 (BP Location: Left Arm)   Pulse 94   Temp 97.9 F (36.6 C) (Oral)   Resp 17   SpO2 93%   Visual Acuity Right Eye Distance:   Left Eye Distance:   Bilateral Distance:    Right Eye  Near:   Left Eye Near:    Bilateral Near:     Physical Exam Constitutional:      General: He is not in acute distress.    Appearance: He is normal weight. He is not ill-appearing, toxic-appearing or diaphoretic.  HENT:     Head:  Normocephalic and atraumatic.     Right Ear: Tympanic membrane normal.     Left Ear: Tympanic membrane normal.     Nose: Nose normal.     Mouth/Throat:     Mouth: Mucous membranes are moist.     Pharynx: No oropharyngeal exudate or posterior oropharyngeal erythema.  Eyes:     Pupils: Pupils are equal, round, and reactive to light.  Cardiovascular:     Rate and Rhythm: Normal rate and regular rhythm.     Pulses: Normal pulses.     Heart sounds: Normal heart sounds.  Pulmonary:     Effort: Pulmonary effort is normal.     Breath sounds: Normal breath sounds.  Musculoskeletal:        General: Normal range of motion.     Cervical back: Normal range of motion.  Skin:    General: Skin is warm and dry.     Capillary Refill: Capillary refill takes less than 2 seconds.  Neurological:     General: No focal deficit present.     Mental Status: He is alert and oriented to person, place, and time.  Psychiatric:        Mood and Affect: Mood normal.        Behavior: Behavior normal.      UC Treatments / Results  Labs (all labs ordered are listed, but only abnormal results are displayed) Labs Reviewed  SARS CORONAVIRUS 2 (TAT 6-24 HRS)    EKG   Radiology No results found.  Procedures Procedures (including critical care time)  Medications Ordered in UC Medications - No data to display  Initial Impression / Assessment and Plan / UC Course  I have reviewed the triage vital signs and the nursing notes.  Pertinent labs & imaging results that were available during my care of the patient were reviewed by me and considered in my medical decision making (see chart for details).     Cough and congestion Final Clinical Impressions(s) / UC Diagnoses    Final diagnoses:  Congestion of upper respiratory tract     Discharge Instructions      Your COVID is pending. Your results will show in your MyChart. Any positive results will result in a phone call from a nurse with next steps in treatment and recommendations.   We encourage conservative treatment with symptom relief. I recommend to discontinue the Sudafed due the elevation in your blood pressure and start Coricidin HBP for congestion. Continue to hydrate well with water 2-3 liters.  We encourage you to use Tylenol alternating with Ibuprofen for your fever/pain if not contraindicated. (Remember to use as directed do not exceed daily dosing recommendations) We also encourage salt water gargles for your sore throat. You should also consider throat lozenges and chloraseptic spray. Lemon and honey tea. Your cough can be soothed with a cough suppressant. You have been prescribed Benzonatate 100 mg BID for the cough.         ED Prescriptions     Medication Sig Dispense Auth. Provider   benzonatate (TESSALON) 100 MG capsule Take 1 capsule (100 mg total) by mouth every 8 (eight) hours. 21 capsule Vevelyn Francois, NP      PDMP not reviewed this encounter.   Dionisio David [place], [place] 01/14/23 [phone]

## 2023-01-15 LAB — SARS CORONAVIRUS 2 (TAT 6-24 HRS): SARS Coronavirus 2: NEGATIVE

## 2023-01-22 ENCOUNTER — Encounter: Payer: Self-pay | Admitting: Internal Medicine

## 2023-01-22 ENCOUNTER — Ambulatory Visit (INDEPENDENT_AMBULATORY_CARE_PROVIDER_SITE_OTHER): Payer: Medicare HMO

## 2023-01-22 ENCOUNTER — Ambulatory Visit (INDEPENDENT_AMBULATORY_CARE_PROVIDER_SITE_OTHER): Payer: Medicare HMO | Admitting: Internal Medicine

## 2023-01-22 VITALS — BP 120/80 | HR 83 | Temp 98.2°F | Ht 70.0 in | Wt 176.0 lb

## 2023-01-22 DIAGNOSIS — J22 Unspecified acute lower respiratory infection: Secondary | ICD-10-CM | POA: Insufficient documentation

## 2023-01-22 DIAGNOSIS — R059 Cough, unspecified: Secondary | ICD-10-CM | POA: Diagnosis not present

## 2023-01-22 DIAGNOSIS — R052 Subacute cough: Secondary | ICD-10-CM | POA: Insufficient documentation

## 2023-01-22 MED ORDER — AMOXICILLIN-POT CLAVULANATE 875-125 MG PO TABS: 1.0000 | ORAL_TABLET | Freq: Two times a day (BID) | ORAL | 0 refills | Status: AC

## 2023-01-22 NOTE — Patient Instructions (Signed)

## 2023-01-22 NOTE — Progress Notes (Signed)
Subjective:  Patient ID: Dan Dudley, male    DOB: [birth year]  Age: 77 y.o. MRN: [id number]  CC: URI   HPI TAMEL KEAR presents for f/up ----  He complains of a 2-week history of cough productive of thick green/brown phlegm with facial pain and shortness of breath.  He denies fever, chills, night sweats, diaphoresis, chest pain, or hemoptysis.  Outpatient Medications Prior to Visit  Medication Sig Dispense Refill   acetaminophen (TYLENOL) 325 MG tablet Take 650 mg by mouth every 6 (six) hours as needed.     aspirin EC 325 MG tablet Take 325 mg by mouth as needed.     benzonatate (TESSALON) 100 MG capsule Take 1 capsule (100 mg total) by mouth every 8 (eight) hours. 21 capsule 0   calcium carbonate (TUMS - DOSED IN MG ELEMENTAL CALCIUM) 500 MG chewable tablet Chew 1 tablet by mouth daily as needed for heartburn.     latanoprost (XALATAN) 0.005 % ophthalmic solution 1 drop at bedtime.     mirabegron ER (MYRBETRIQ) 50 MG TB24 tablet Take 1 tablet (50 mg total) by mouth daily. 30 tablet 4   omeprazole (PRILOSEC) 20 MG capsule Take 20 mg by mouth every morning.     tamsulosin (FLOMAX) 0.4 MG CAPS capsule Take 1 capsule (0.4 mg total) by mouth daily after supper. (Patient taking differently: Take by mouth 2 (two) times daily. Takes 2 pills once daily) 30 capsule 5   COVID-19 mRNA vaccine 2023-2024 (COMIRNATY) syringe Inject into the muscle. 0.3 mL 0   RSV vaccine recomb adjuvanted (AREXVY) 120 MCG/0.5ML injection Inject into the muscle. (Patient not taking: Reported on 01/22/2023) 0.5 mL 0   Tdap (BOOSTRIX) 5-2.5-18.5 LF-MCG/0.5 injection Inject into the muscle. (Patient not taking: Reported on 01/22/2023) 0.5 mL 0   triamcinolone cream (KENALOG) 0.1 % Apply 1 application topically 2 (two) times daily. (Patient not taking: Reported on 10/03/2022) 45 g 0   No facility-administered medications prior to visit.    ROS Review of Systems  Constitutional: Negative.  Negative for  chills, diaphoresis, fatigue and fever.  HENT: Negative.  Negative for facial swelling, sinus pressure, sore throat and trouble swallowing.   Eyes: Negative.   Respiratory:  Positive for cough and shortness of breath. Negative for chest tightness and wheezing.   Cardiovascular:  Negative for chest pain, palpitations and leg swelling.  Gastrointestinal:  Negative for abdominal pain, diarrhea, nausea and vomiting.  Endocrine: Negative.   Genitourinary: Negative.  Negative for difficulty urinating.  Musculoskeletal: Negative.  Negative for arthralgias and myalgias.  Skin: Negative.   Neurological:  Negative for dizziness, weakness and headaches.  Hematological:  Negative for adenopathy. Does not bruise/bleed easily.  Psychiatric/Behavioral: Negative.      Objective:  BP 120/80 (BP Location: Left Arm, Patient Position: Sitting, Cuff Size: Normal)   Pulse 83   Temp 98.2 F (36.8 C) (Oral)   Ht '5\' 10"'$  (1.778 m)   Wt 176 lb (79.8 kg)   SpO2 93%   BMI 25.25 kg/m   BP Readings from Last 3 Encounters:  01/22/23 120/80  01/14/23 (!) 155/94  10/03/22 118/80    Wt Readings from Last 3 Encounters:  01/22/23 176 lb (79.8 kg)  10/03/22 175 lb 2 oz (79.4 kg)  03/21/22 175 lb (79.4 kg)    Physical Exam Vitals reviewed.  Constitutional:      Appearance: He is not ill-appearing.  HENT:     Nose: Nose normal.     Mouth/Throat:  Mouth: Mucous membranes are moist.  Eyes:     General: No scleral icterus.    Conjunctiva/sclera: Conjunctivae normal.  Cardiovascular:     Rate and Rhythm: Normal rate and regular rhythm.     Heart sounds: No murmur heard. Pulmonary:     Effort: Pulmonary effort is normal.     Breath sounds: No stridor. No wheezing, rhonchi or rales.  Abdominal:     General: Abdomen is flat.     Palpations: There is no mass.     Tenderness: There is no abdominal tenderness. There is no guarding.     Hernia: No hernia is present.  Musculoskeletal:        General:  Normal range of motion.     Cervical back: Neck supple.     Right lower leg: No edema.     Left lower leg: No edema.  Lymphadenopathy:     Cervical: No cervical adenopathy.  Skin:    General: Skin is warm and dry.  Neurological:     General: No focal deficit present.     Mental Status: He is alert.  Psychiatric:        Mood and Affect: Mood normal.        Behavior: Behavior normal.     Lab Results  Component Value Date   WBC 5.5 10/03/2022   HGB 15.3 10/03/2022   HCT 45.2 10/03/2022   PLT 346.0 10/03/2022   GLUCOSE 98 10/03/2022   CHOL 206 (H) 10/03/2022   TRIG 58.0 10/03/2022   HDL 72.40 10/03/2022   LDLDIRECT 134.3 03/27/2010   LDLCALC 122 (H) 10/03/2022   ALT 14 10/03/2022   AST 17 10/03/2022   NA 138 10/03/2022   K 4.0 10/03/2022   CL 104 10/03/2022   CREATININE 0.80 10/03/2022   BUN 15 10/03/2022   CO2 30 10/03/2022   TSH 1.29 10/03/2022   PSA 0.1 05/10/2022   INR 1.00 10/08/2017    DG Chest 2 View  Result Date: 01/22/2023 CLINICAL DATA:  Cough for 2 weeks. EXAM: CHEST - 2 VIEW COMPARISON:  PA and lateral chest 10/16/2021. FINDINGS: The lungs are clear. Heart size is normal. No pneumothorax or pleural effusion. No acute or focal bony abnormality. IMPRESSION: No acute disease. Electronically Signed   By: Inge Rise M.D.   On: 01/22/2023 08:39     Assessment & Plan:   Jefferie was seen today for uri.  Diagnoses and all orders for this visit:  Subacute cough- His chest x-ray is negative for mass or infiltrate.  Will treat for bacterial sinusitis and lower respiratory tract infection. -     DG Chest 2 View; Future  LRTI (lower respiratory tract infection) -     amoxicillin-clavulanate (AUGMENTIN) 875-125 MG tablet; Take 1 tablet by mouth 2 (two) times daily for 7 days.   I am having Gerome Apley start on amoxicillin-clavulanate. I am also having him maintain his calcium carbonate, aspirin EC, omeprazole, tamsulosin, acetaminophen,  mirabegron ER, triamcinolone cream, Arexvy, COVID-19 mRNA vaccine 2023-2024, latanoprost, Tdap, and benzonatate.  Meds ordered this encounter  Medications   amoxicillin-clavulanate (AUGMENTIN) 875-125 MG tablet    Sig: Take 1 tablet by mouth 2 (two) times daily for 7 days.    Dispense:  14 tablet    Refill:  0     Follow-up: Return in about 4 weeks (around 02/19/2023).  Scarlette Calico, MD

## 2023-02-19 ENCOUNTER — Ambulatory Visit (INDEPENDENT_AMBULATORY_CARE_PROVIDER_SITE_OTHER): Payer: Medicare HMO | Admitting: Internal Medicine

## 2023-02-19 VITALS — BP 120/70 | HR 73 | Temp 97.9°F | Ht 70.0 in | Wt 175.0 lb

## 2023-02-19 DIAGNOSIS — J069 Acute upper respiratory infection, unspecified: Secondary | ICD-10-CM

## 2023-02-19 DIAGNOSIS — I10 Essential (primary) hypertension: Secondary | ICD-10-CM | POA: Diagnosis not present

## 2023-02-19 DIAGNOSIS — K2100 Gastro-esophageal reflux disease with esophagitis, without bleeding: Secondary | ICD-10-CM | POA: Diagnosis not present

## 2023-02-19 NOTE — Progress Notes (Signed)
Subjective:  Patient ID: Dan Dudley, male    DOB: 27-Apr-1946  Age: 77 y.o. MRN: [id number]  CC: URI   HPI Dan Dudley presents for f/up ---  His cough has improved.  When he does cough it is nonproductive and he denies chest pain, shortness of breath, fever, chills, or night sweats.  Outpatient Medications Prior to Visit  Medication Sig Dispense Refill   acetaminophen (TYLENOL) 325 MG tablet Take 650 mg by mouth every 6 (six) hours as needed.     aspirin EC 325 MG tablet Take 325 mg by mouth as needed.     calcium carbonate (TUMS - DOSED IN MG ELEMENTAL CALCIUM) 500 MG chewable tablet Chew 1 tablet by mouth daily as needed for heartburn.     latanoprost (XALATAN) 0.005 % ophthalmic solution 1 drop at bedtime.     mirabegron ER (MYRBETRIQ) 50 MG TB24 tablet Take 1 tablet (50 mg total) by mouth daily. 30 tablet 4   omeprazole (PRILOSEC) 20 MG capsule Take 20 mg by mouth every morning.     tamsulosin (FLOMAX) 0.4 MG CAPS capsule Take 1 capsule (0.4 mg total) by mouth daily after supper. (Patient taking differently: Take by mouth 2 (two) times daily. Takes 2 pills once daily) 30 capsule 5   benzonatate (TESSALON) 100 MG capsule Take 1 capsule (100 mg total) by mouth every 8 (eight) hours. 21 capsule 0   COVID-19 mRNA vaccine 2023-2024 (COMIRNATY) syringe Inject into the muscle. (Patient not taking: Reported on 02/19/2023) 0.3 mL 0   RSV vaccine recomb adjuvanted (AREXVY) 120 MCG/0.5ML injection Inject into the muscle. (Patient not taking: Reported on 01/22/2023) 0.5 mL 0   Tdap (BOOSTRIX) 5-2.5-18.5 LF-MCG/0.5 injection Inject into the muscle. (Patient not taking: Reported on 01/22/2023) 0.5 mL 0   triamcinolone cream (KENALOG) 0.1 % Apply 1 application topically 2 (two) times daily. (Patient not taking: Reported on 10/03/2022) 45 g 0   No facility-administered medications prior to visit.    ROS Review of Systems  Constitutional:  Negative for chills, fatigue and fever.   HENT: Negative.  Negative for sinus pressure, sore throat and trouble swallowing.   Respiratory:  Positive for cough. Negative for chest tightness, shortness of breath and wheezing.   Cardiovascular:  Negative for chest pain, palpitations and leg swelling.  Gastrointestinal:  Negative for abdominal pain, constipation, diarrhea, nausea and vomiting.  Genitourinary: Negative.  Negative for difficulty urinating.  Musculoskeletal: Negative.   Skin: Negative.  Negative for rash.  Neurological:  Negative for dizziness, weakness and light-headedness.  Hematological:  Negative for adenopathy. Does not bruise/bleed easily.  Psychiatric/Behavioral: Negative.      Objective:  BP 120/70 (BP Location: Left Arm, Patient Position: Sitting, Cuff Size: Normal)   Pulse 73   Temp 97.9 F (36.6 C) (Oral)   Ht 5\' 10"  (1.778 m)   Wt 175 lb (79.4 kg)   SpO2 95%   BMI 25.11 kg/m   BP Readings from Last 3 Encounters:  02/19/23 120/70  01/22/23 120/80  01/14/23 (!) 155/94    Wt Readings from Last 3 Encounters:  02/19/23 175 lb (79.4 kg)  01/22/23 176 lb (79.8 kg)  10/03/22 175 lb 2 oz (79.4 kg)    Physical Exam Vitals reviewed.  Constitutional:      Appearance: Normal appearance.  HENT:     Nose: Nose normal.     Mouth/Throat:     Mouth: Mucous membranes are moist.     Pharynx: No posterior oropharyngeal erythema.  Eyes:     General: No scleral icterus. Cardiovascular:     Rate and Rhythm: Normal rate and regular rhythm.     Heart sounds: No murmur heard. Pulmonary:     Effort: Pulmonary effort is normal.     Breath sounds: No stridor. No wheezing, rhonchi or rales.  Abdominal:     General: Abdomen is flat.     Palpations: There is no mass.     Tenderness: There is no abdominal tenderness. There is no guarding.     Hernia: No hernia is present.  Musculoskeletal:        General: Normal range of motion.     Cervical back: Neck supple.     Right lower leg: No edema.     Left lower  leg: No edema.  Lymphadenopathy:     Cervical: No cervical adenopathy.  Skin:    General: Skin is warm and dry.     Coloration: Skin is not pale.     Findings: No rash.  Neurological:     General: No focal deficit present.     Mental Status: He is alert. Mental status is at baseline.     Lab Results  Component Value Date   WBC 5.5 10/03/2022   HGB 15.3 10/03/2022   HCT 45.2 10/03/2022   PLT 346.0 10/03/2022   GLUCOSE 98 10/03/2022   CHOL 206 (H) 10/03/2022   TRIG 58.0 10/03/2022   HDL 72.40 10/03/2022   LDLDIRECT 134.3 03/27/2010   LDLCALC 122 (H) 10/03/2022   ALT 14 10/03/2022   AST 17 10/03/2022   NA 138 10/03/2022   K 4.0 10/03/2022   CL 104 10/03/2022   CREATININE 0.80 10/03/2022   BUN 15 10/03/2022   CO2 30 10/03/2022   TSH 1.29 10/03/2022   PSA 0.1 05/10/2022   INR 1.00 10/08/2017   DG Chest 2 View  Result Date: 01/22/2023 CLINICAL DATA:  Cough for 2 weeks. EXAM: CHEST - 2 VIEW COMPARISON:  PA and lateral chest 10/16/2021. FINDINGS: The lungs are clear. Heart size is normal. No pneumothorax or pleural effusion. No acute or focal bony abnormality. IMPRESSION: No acute disease. Electronically Signed   By: Drusilla Kannerhomas  Dalessio M.D.   On: 01/22/2023 08:39     Assessment & Plan:    Essential hypertension- His blood pressure is well-controlled.  Gastroesophageal reflux disease with esophagitis without hemorrhage- His symptoms are well controlled.  Viral upper respiratory tract infection- This is resolving.     Follow-up: No follow-ups on file.  Sanda Lingerhomas Keldrick Pomplun, MD

## 2023-02-23 ENCOUNTER — Encounter: Payer: Self-pay | Admitting: Internal Medicine

## 2023-02-23 DIAGNOSIS — J069 Acute upper respiratory infection, unspecified: Secondary | ICD-10-CM | POA: Insufficient documentation

## 2023-05-01 IMAGING — DX DG CHEST 2V
2 series · 2 of 2 positions shown · non-contrast
Comparison: October 08, 2017.

CLINICAL DATA: cough, LGF for one week mildly increased conspicuity
of

EXAM:
CHEST - 2 VIEW

[chest pa]
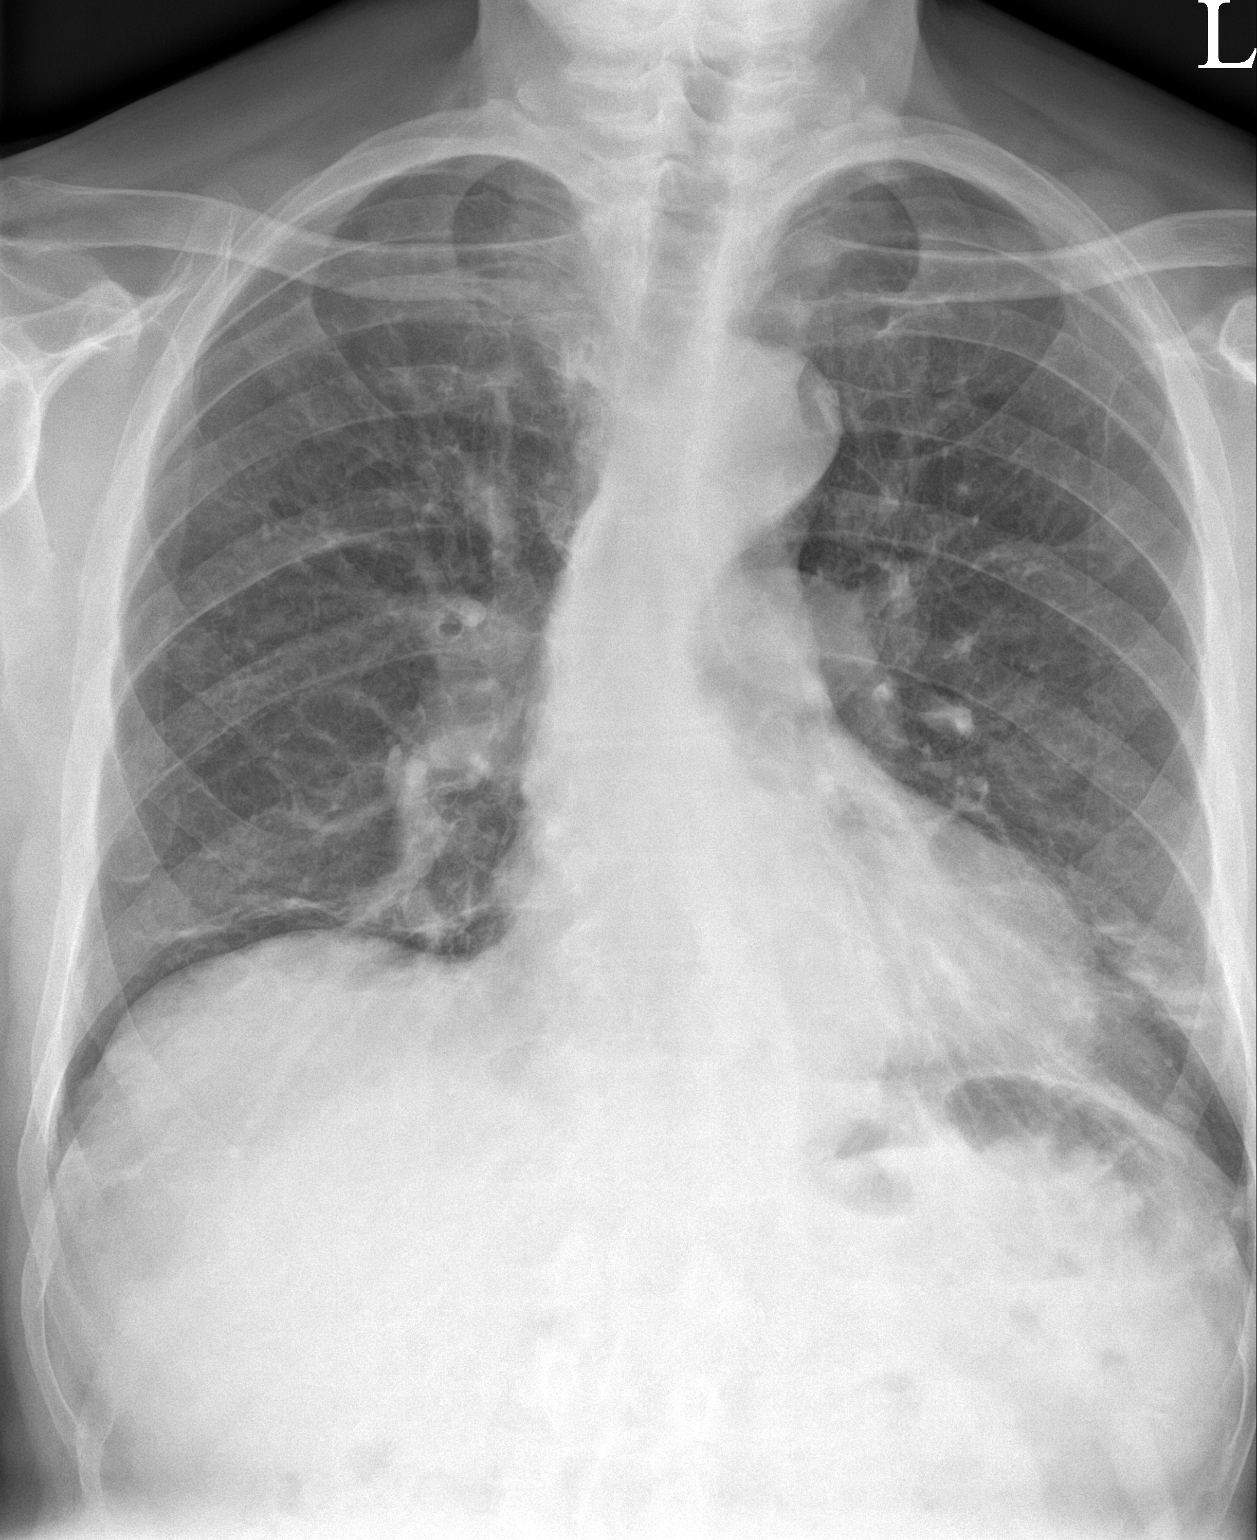

[chest lat]
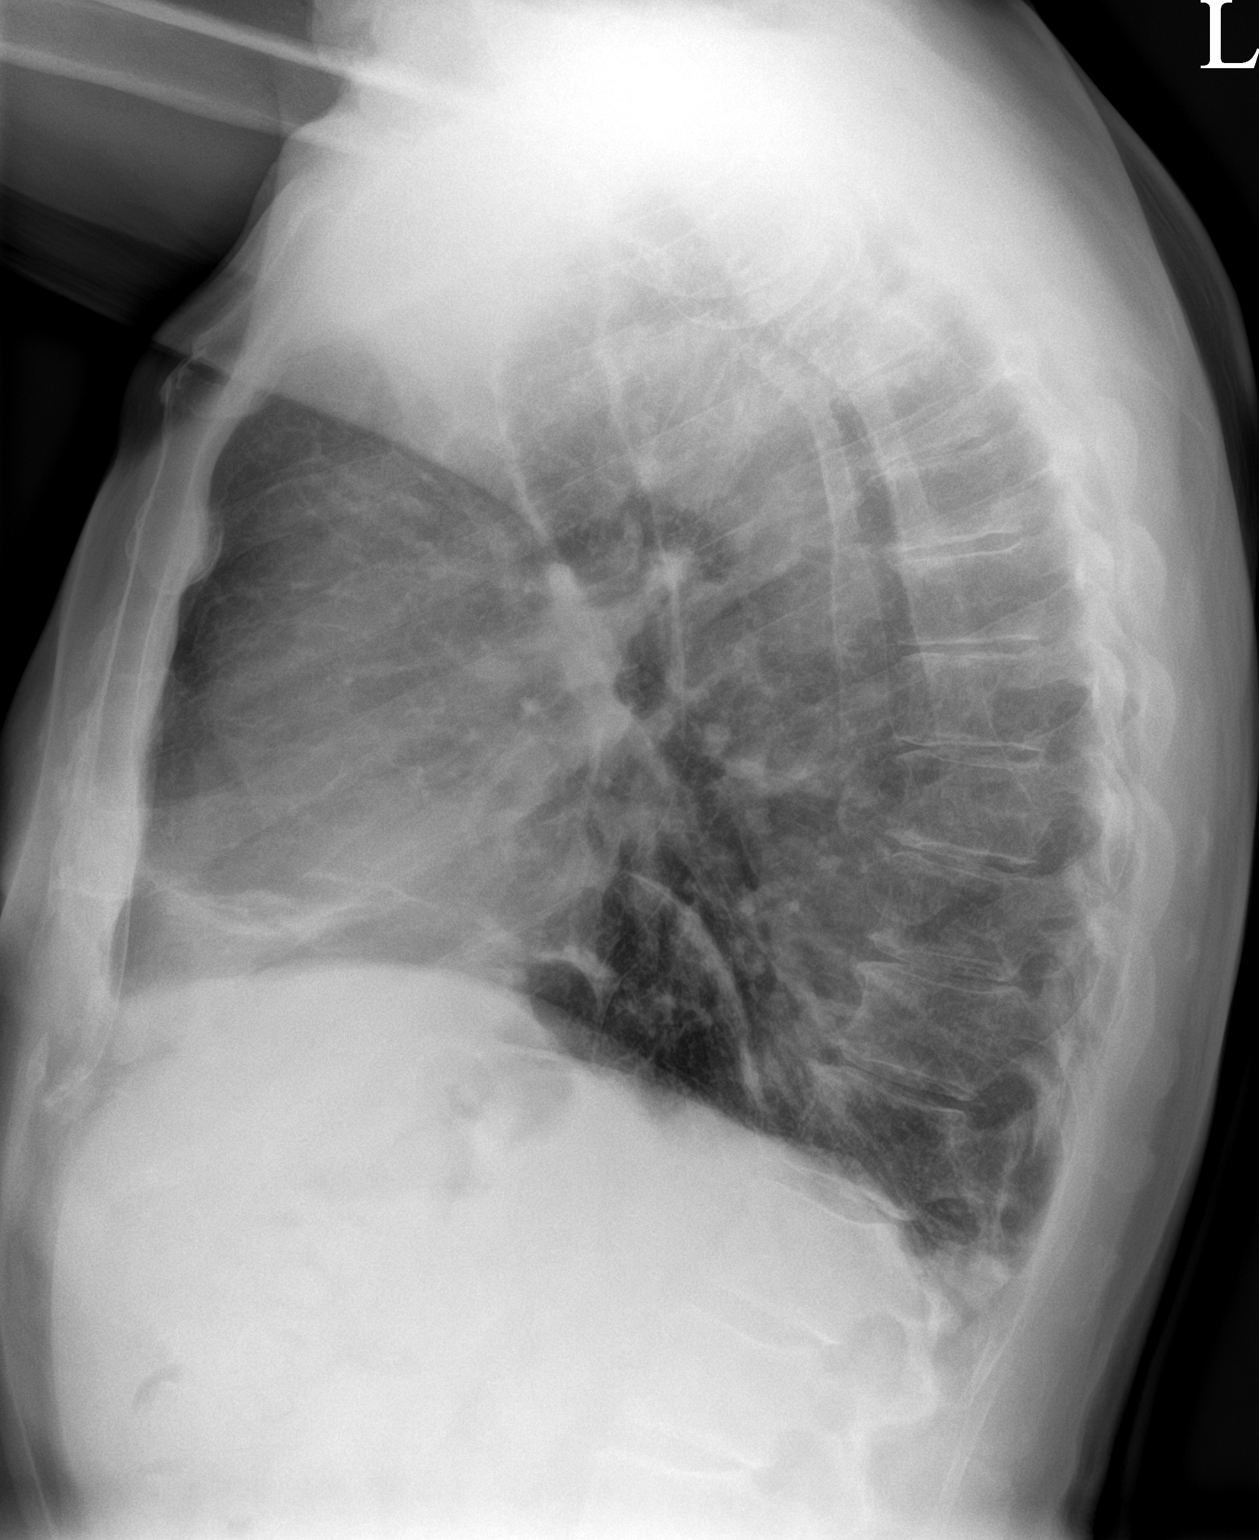

[2 of 2 positions shown; findings below may reference images not displayed]

FINDINGS: Linear left basilar opacities. No confluent consolidation. No
visible pleural effusions or pneumothorax. Cardiac silhouette is
similar in within normal limits. Mild S-shaped thoracolumbar
curvature.
IMPRESSION: Linear left basilar opacities, most likely atelectasis/scar.
Infection is not excluded.

## 2023-05-06 DIAGNOSIS — C61 Malignant neoplasm of prostate: Secondary | ICD-10-CM | POA: Diagnosis not present

## 2023-05-09 IMAGING — CT CT HEAD W/O CM
3 of 4 series · 15 of 47 positions shown, 18 images · non-contrast
Comparison: CT head 11/01/2010

CLINICAL DATA: Headache, new or worsening. Right-sided head pain
near temple. History of prostate cancer

EXAM:
CT HEAD WITHOUT CONTRAST
TECHNIQUE: Contiguous axial images were obtained from the base of the skull
through the vertex without intravenous contrast.

[Series 3: head 2.0 hr68 · axial · 0.44mm/px · z∈[+1263,+1381]mm · 9 of 75 slices shown, 12 images]
[im 8/75  brain]
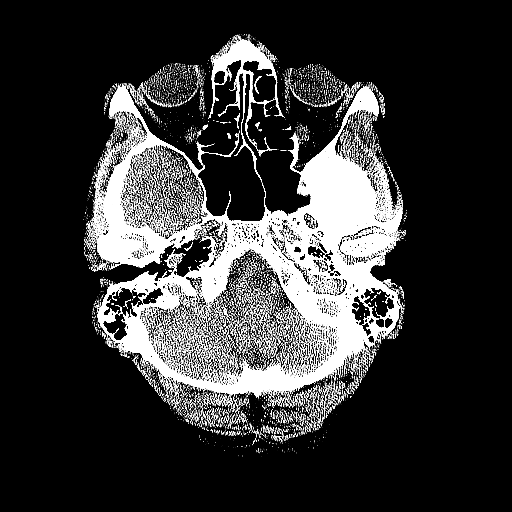
[im 8/75  bone]
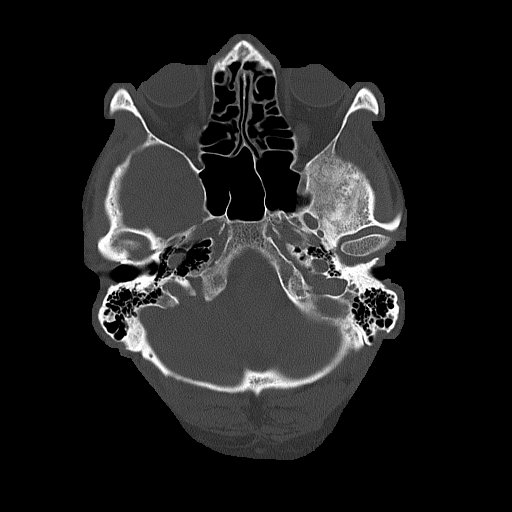
[im 15/75  brain]
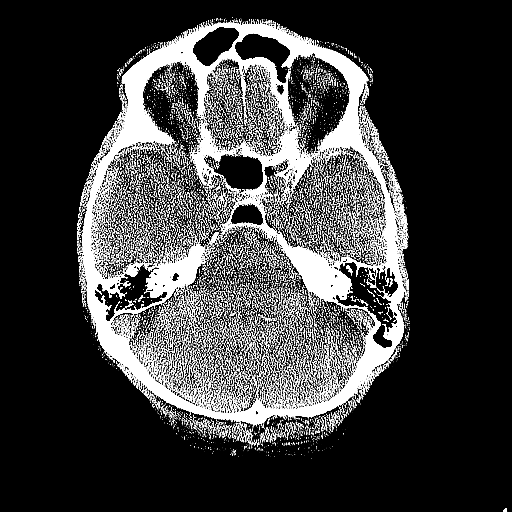
[im 23/75  brain]
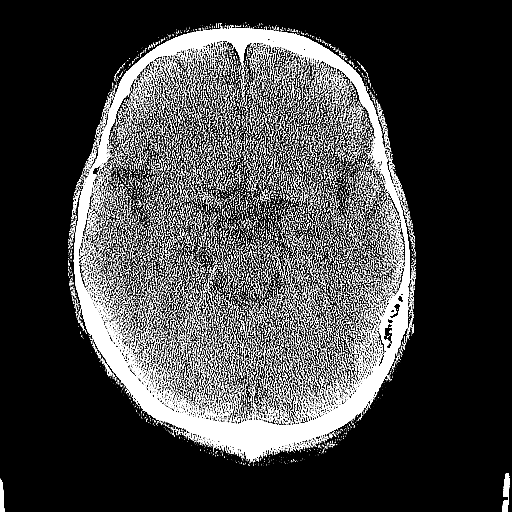
[im 30/75  brain]
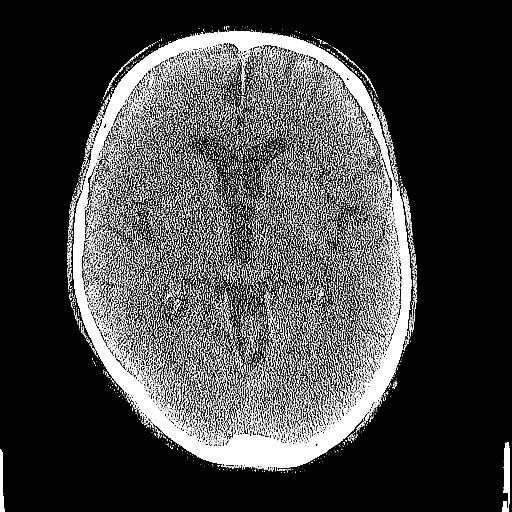
[im 38/75  brain]
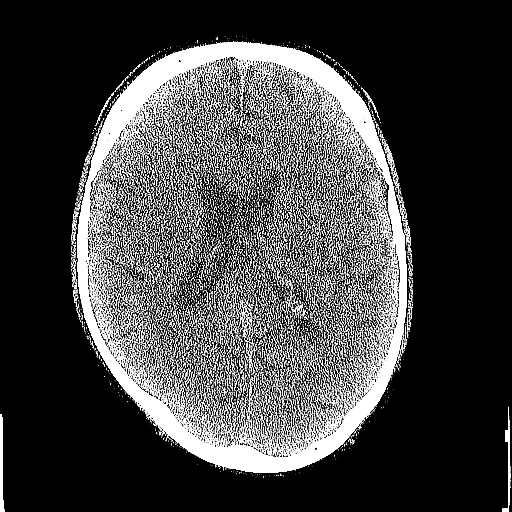
[im 38/75  bone]
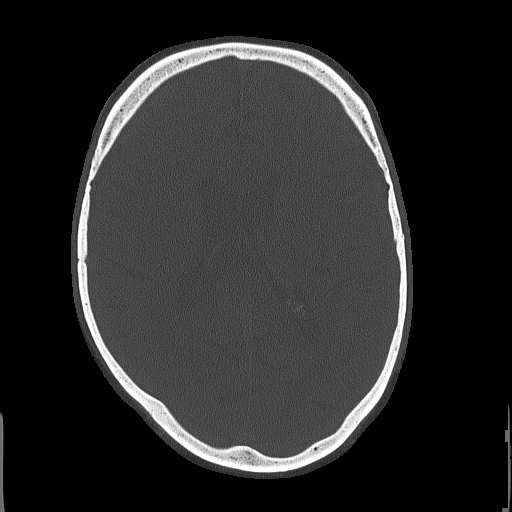
[im 45/75  brain]
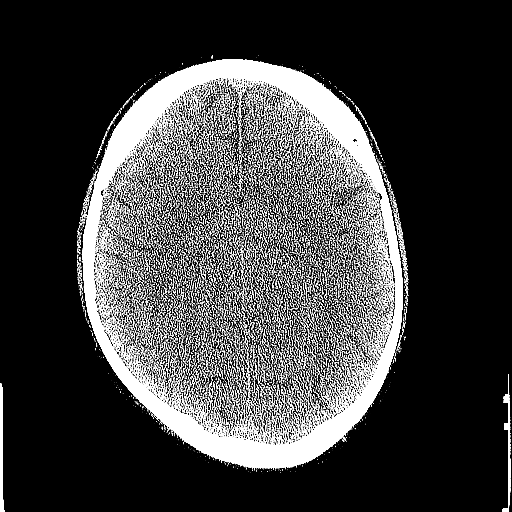
[im 52/75  brain]
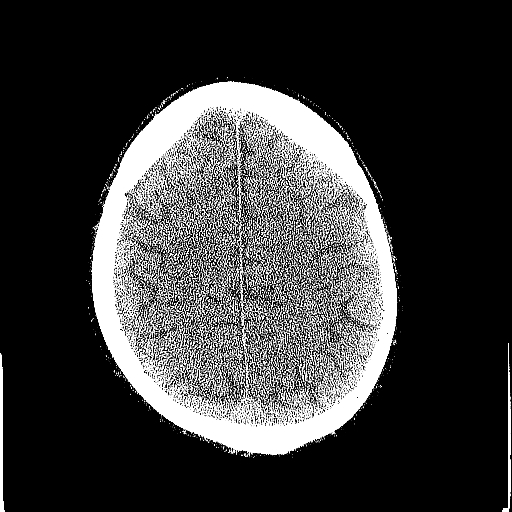
[im 60/75  brain]
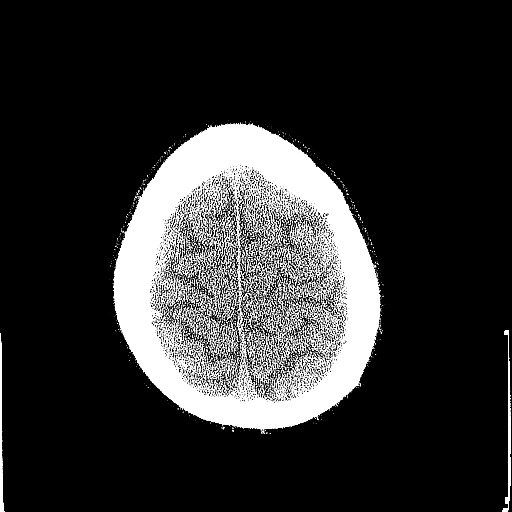
[im 67/75  brain]
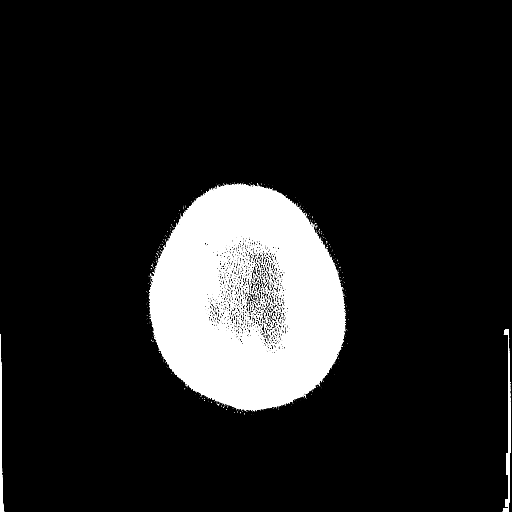
[im 67/75  bone]
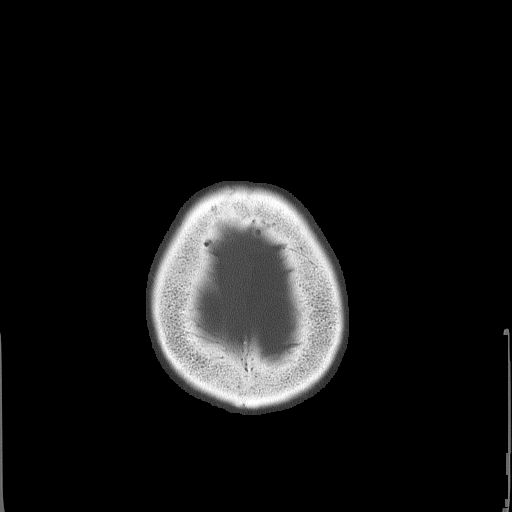

[Series 4: head 3.0 mpr cor · coronal · 0.31mm/px · 3 of 72 slices shown]
[im 24/72  brain]
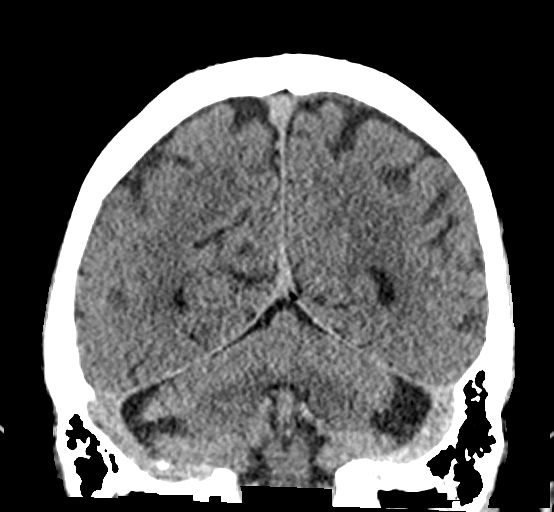
[im 32/72  brain]
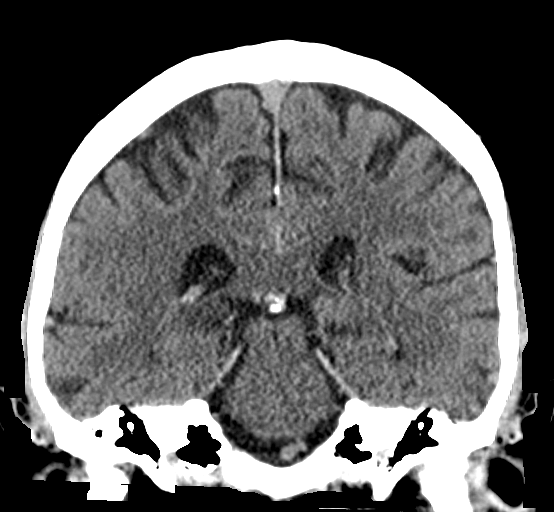
[im 40/72  brain]
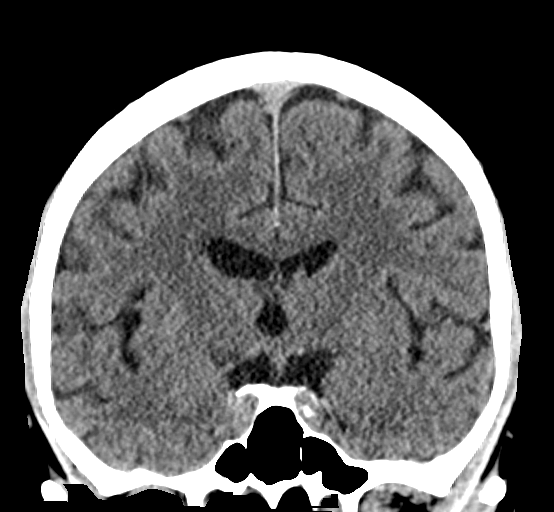

[Series 5: head 3.0 mpr sag · sagittal · 0.31mm/px · 3 of 58 slices shown]
[im 20/58  brain]
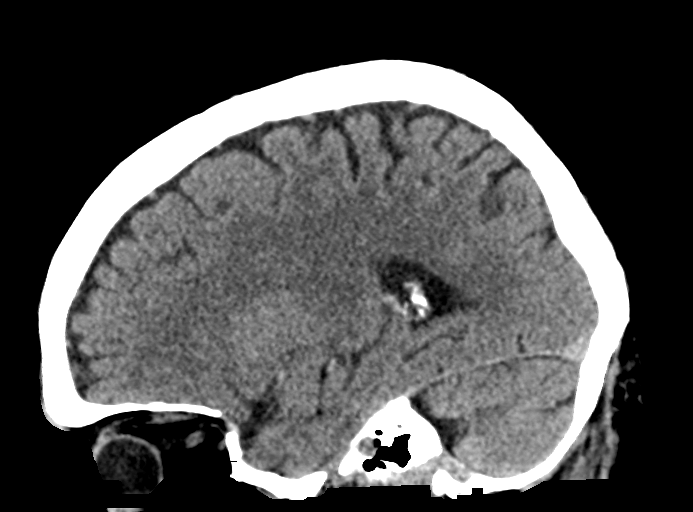
[im 29/58  brain]
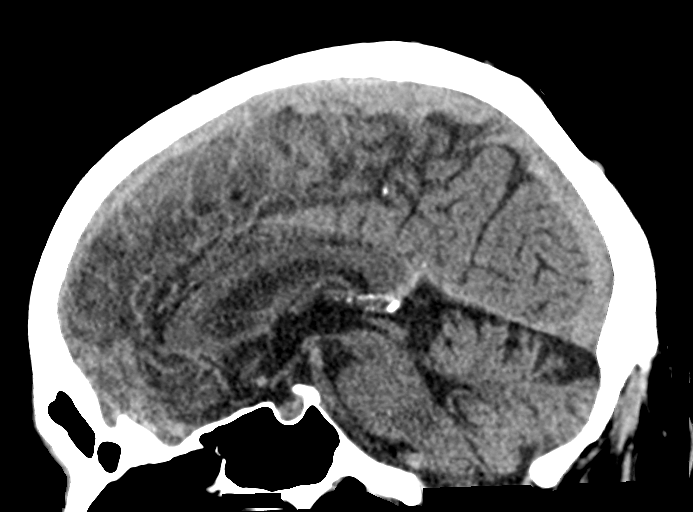
[im 39/58  brain]
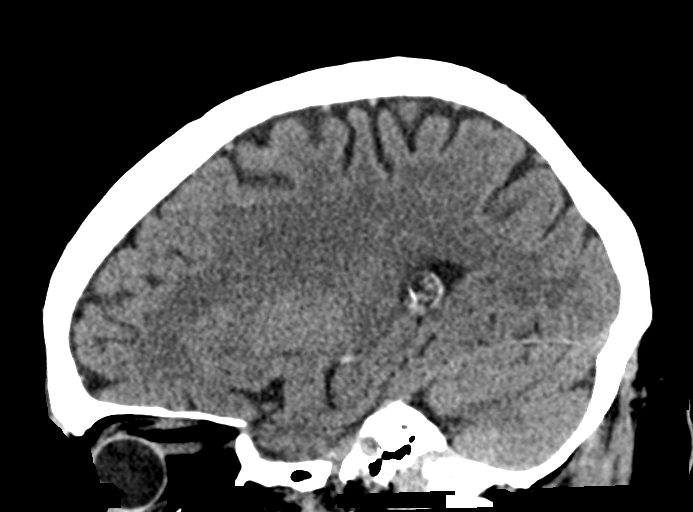

[15 of 47 positions shown; findings below may reference images not displayed]

FINDINGS: Brain: There is no evidence of acute intracranial hemorrhage, mass
lesion, brain edema or extra-axial fluid collection. The ventricles
and subarachnoid spaces are appropriately sized for age. There is no
CT evidence of acute cortical infarction.

Vascular: Intracranial vascular calcifications. No hyperdense vessel
identified.

Skull: Negative for fracture or focal lesion.

Sinuses/Orbits: The visualized paranasal sinuses and mastoid air
cells are clear. No orbital abnormalities are seen.

Other: None.
IMPRESSION: Unremarkable noncontrast head CT.  No acute intracranial findings.

## 2023-06-03 DIAGNOSIS — H43393 Other vitreous opacities, bilateral: Secondary | ICD-10-CM | POA: Diagnosis not present

## 2023-06-03 DIAGNOSIS — H2513 Age-related nuclear cataract, bilateral: Secondary | ICD-10-CM | POA: Diagnosis not present

## 2023-06-03 DIAGNOSIS — H40052 Ocular hypertension, left eye: Secondary | ICD-10-CM | POA: Diagnosis not present

## 2023-06-03 DIAGNOSIS — H401111 Primary open-angle glaucoma, right eye, mild stage: Secondary | ICD-10-CM | POA: Diagnosis not present

## 2023-08-07 ENCOUNTER — Other Ambulatory Visit (HOSPITAL_BASED_OUTPATIENT_CLINIC_OR_DEPARTMENT_OTHER): Payer: Self-pay

## 2023-08-07 MED ORDER — FLUAD 0.5 ML IM SUSY
PREFILLED_SYRINGE | Freq: Once | INTRAMUSCULAR | 0 refills | Status: AC
  Filled 2023-08-07: qty 0.5, 1d supply, fill #0

## 2023-08-07 MED ORDER — INFLUENZA VAC A&B SURF ANT ADJ 0.5 ML IM SUSY
0.5000 mL | PREFILLED_SYRINGE | Freq: Once | INTRAMUSCULAR | 0 refills | Status: AC
  Filled 2023-08-07: qty 0.5, 1d supply, fill #0

## 2023-08-07 MED ORDER — COVID-19 MRNA VAC-TRIS(PFIZER) 30 MCG/0.3ML IM SUSY
0.3000 mL | PREFILLED_SYRINGE | Freq: Once | INTRAMUSCULAR | 0 refills | Status: AC
  Filled 2023-08-07: qty 0.3, 1d supply, fill #0

## 2023-08-15 DIAGNOSIS — D485 Neoplasm of uncertain behavior of skin: Secondary | ICD-10-CM | POA: Diagnosis not present

## 2023-08-15 DIAGNOSIS — L814 Other melanin hyperpigmentation: Secondary | ICD-10-CM | POA: Diagnosis not present

## 2023-08-15 DIAGNOSIS — D225 Melanocytic nevi of trunk: Secondary | ICD-10-CM | POA: Diagnosis not present

## 2023-08-15 DIAGNOSIS — L570 Actinic keratosis: Secondary | ICD-10-CM | POA: Diagnosis not present

## 2023-08-15 DIAGNOSIS — L821 Other seborrheic keratosis: Secondary | ICD-10-CM | POA: Diagnosis not present

## 2023-10-31 ENCOUNTER — Ambulatory Visit (INDEPENDENT_AMBULATORY_CARE_PROVIDER_SITE_OTHER): Payer: Medicare HMO

## 2023-10-31 ENCOUNTER — Ambulatory Visit (INDEPENDENT_AMBULATORY_CARE_PROVIDER_SITE_OTHER): Payer: Medicare HMO | Admitting: Internal Medicine

## 2023-10-31 ENCOUNTER — Encounter: Payer: Self-pay | Admitting: Internal Medicine

## 2023-10-31 VITALS — BP 132/86 | HR 90 | Temp 98.2°F | Resp 16 | Ht 70.0 in | Wt 176.0 lb

## 2023-10-31 DIAGNOSIS — I10 Essential (primary) hypertension: Secondary | ICD-10-CM

## 2023-10-31 DIAGNOSIS — Z0000 Encounter for general adult medical examination without abnormal findings: Secondary | ICD-10-CM

## 2023-10-31 DIAGNOSIS — Z0001 Encounter for general adult medical examination with abnormal findings: Secondary | ICD-10-CM

## 2023-10-31 DIAGNOSIS — R053 Chronic cough: Secondary | ICD-10-CM

## 2023-10-31 DIAGNOSIS — E785 Hyperlipidemia, unspecified: Secondary | ICD-10-CM | POA: Diagnosis not present

## 2023-10-31 DIAGNOSIS — K2100 Gastro-esophageal reflux disease with esophagitis, without bleeding: Secondary | ICD-10-CM

## 2023-10-31 DIAGNOSIS — R059 Cough, unspecified: Secondary | ICD-10-CM | POA: Diagnosis not present

## 2023-10-31 LAB — HEPATIC FUNCTION PANEL
ALT: 11 U/L (ref 0–53)
AST: 16 U/L (ref 0–37)
Albumin: 4.6 g/dL (ref 3.5–5.2)
Alkaline Phosphatase: 55 U/L (ref 39–117)
Bilirubin, Direct: 0.1 mg/dL (ref 0.0–0.3)
Total Bilirubin: 0.8 mg/dL (ref 0.2–1.2)
Total Protein: 6.8 g/dL (ref 6.0–8.3)

## 2023-10-31 LAB — CBC WITH DIFFERENTIAL/PLATELET
Basophils Absolute: 0.1 10*3/uL (ref 0.0–0.1)
Basophils Relative: 1.3 % (ref 0.0–3.0)
Eosinophils Absolute: 0.1 10*3/uL (ref 0.0–0.7)
Eosinophils Relative: 1.4 % (ref 0.0–5.0)
HCT: 44.4 % (ref 39.0–52.0)
Hemoglobin: 14.6 g/dL (ref 13.0–17.0)
Lymphocytes Relative: 20.9 % (ref 12.0–46.0)
Lymphs Abs: 1.3 10*3/uL (ref 0.7–4.0)
MCHC: 32.9 g/dL (ref 30.0–36.0)
MCV: 93.1 fL (ref 78.0–100.0)
Monocytes Absolute: 0.6 10*3/uL (ref 0.1–1.0)
Monocytes Relative: 9.2 % (ref 3.0–12.0)
Neutro Abs: 4 10*3/uL (ref 1.4–7.7)
Neutrophils Relative %: 67.2 % (ref 43.0–77.0)
Platelets: 330 10*3/uL (ref 150.0–400.0)
RBC: 4.77 Mil/uL (ref 4.22–5.81)
RDW: 14.1 % (ref 11.5–15.5)
WBC: 6 10*3/uL (ref 4.0–10.5)

## 2023-10-31 LAB — URINALYSIS, ROUTINE W REFLEX MICROSCOPIC
Bilirubin Urine: NEGATIVE
Hgb urine dipstick: NEGATIVE
Ketones, ur: NEGATIVE
Leukocytes,Ua: NEGATIVE
Nitrite: NEGATIVE
Specific Gravity, Urine: 1.02 (ref 1.000–1.030)
Total Protein, Urine: NEGATIVE
Urine Glucose: NEGATIVE
Urobilinogen, UA: 0.2 (ref 0.0–1.0)
pH: 6 (ref 5.0–8.0)

## 2023-10-31 LAB — BASIC METABOLIC PANEL
BUN: 17 mg/dL (ref 6–23)
CO2: 29 meq/L (ref 19–32)
Calcium: 9.4 mg/dL (ref 8.4–10.5)
Chloride: 104 meq/L (ref 96–112)
Creatinine, Ser: 0.89 mg/dL (ref 0.40–1.50)
GFR: 81.78 mL/min (ref >60.00)
Glucose, Bld: 100 mg/dL — ABNORMAL HIGH (ref 70–99)
Potassium: 3.8 meq/L (ref 3.5–5.1)
Sodium: 141 meq/L (ref 135–145)

## 2023-10-31 LAB — LIPID PANEL
Cholesterol: 199 mg/dL (ref 0–200)
HDL: 67.3 mg/dL (ref >39.00)
LDL Cholesterol: 118 mg/dL — ABNORMAL HIGH (ref 0–99)
NonHDL: 132.13
Total CHOL/HDL Ratio: 3
Triglycerides: 72 mg/dL (ref 0.0–149.0)
VLDL: 14.4 mg/dL (ref 0.0–40.0)

## 2023-10-31 LAB — TSH: TSH: 1.24 u[IU]/mL (ref 0.35–5.50)

## 2023-10-31 MED ORDER — ESOMEPRAZOLE MAGNESIUM 40 MG PO CPDR: 40.0000 mg | DELAYED_RELEASE_CAPSULE | Freq: Every day | ORAL | 1 refills | Status: DC

## 2023-10-31 NOTE — Patient Instructions (Addendum)
PLEASE TAKE NEXIUM IN THE MORNING ON AN EMPTY STOMACH AND DO NOT CONSUME ANY FOR OR LIQUIDS FOR AT LEAST 30 MINUTES   Health Maintenance, Male Adopting a healthy lifestyle and getting preventive care are important in promoting health and wellness. Ask your health care provider about: The right schedule for you to have regular tests and exams. Things you can do on your own to prevent diseases and keep yourself healthy. What should I know about diet, weight, and exercise? Eat a healthy diet  Eat a diet that includes plenty of vegetables, fruits, low-fat dairy products, and lean protein. Do not eat a lot of foods that are high in solid fats, added sugars, or sodium. Maintain a healthy weight Body mass index (BMI) is a measurement that can be used to identify possible weight problems. It estimates body fat based on height and weight. Your health care provider can help determine your BMI and help you achieve or maintain a healthy weight. Get regular exercise Get regular exercise. This is one of the most important things you can do for your health. Most adults should: Exercise for at least 150 minutes each week. The exercise should increase your heart rate and make you sweat (moderate-intensity exercise). Do strengthening exercises at least twice a week. This is in addition to the moderate-intensity exercise. Spend less time sitting. Even light physical activity can be beneficial. Watch cholesterol and blood lipids Have your blood tested for lipids and cholesterol at 79 years of age, then have this test every 5 years. You may need to have your cholesterol levels checked more often if: Your lipid or cholesterol levels are high. You are older than 79 years of age. You are at high risk for heart disease. What should I know about cancer screening? Many types of cancers can be detected early and may often be prevented. Depending on your health history and family history, you may need to have cancer  screening at various ages. This may include screening for: Colorectal cancer. Prostate cancer. Skin cancer. Lung cancer. What should I know about heart disease, diabetes, and high blood pressure? Blood pressure and heart disease High blood pressure causes heart disease and increases the risk of stroke. This is more likely to develop in people who have high blood pressure readings or are overweight. Talk with your health care provider about your target blood pressure readings. Have your blood pressure checked: Every 3-5 years if you are 40-55 years of age. Every year if you are 103 years old or older. If you are between the ages of 19 and 4 and are a current or former smoker, ask your health care provider if you should have a one-time screening for abdominal aortic aneurysm (AAA). Diabetes Have regular diabetes screenings. This checks your fasting blood sugar level. Have the screening done: Once every three years after age 30 if you are at a normal weight and have a low risk for diabetes. More often and at a younger age if you are overweight or have a high risk for diabetes. What should I know about preventing infection? Hepatitis B If you have a higher risk for hepatitis B, you should be screened for this virus. Talk with your health care provider to find out if you are at risk for hepatitis B infection. Hepatitis C Blood testing is recommended for: Everyone born from [birth year] through 1967. Anyone with known risk factors for hepatitis C. Sexually transmitted infections (STIs) You should be screened each year for STIs, including gonorrhea  and chlamydia, if: You are sexually active and are younger than 79 years of age. You are older than 79 years of age and your health care provider tells you that you are at risk for this type of infection. Your sexual activity has changed since you were last screened, and you are at increased risk for chlamydia or gonorrhea. Ask your health care provider if  you are at risk. Ask your health care provider about whether you are at high risk for HIV. Your health care provider may recommend a prescription medicine to help prevent HIV infection. If you choose to take medicine to prevent HIV, you should first get tested for HIV. You should then be tested every 3 months for as long as you are taking the medicine. Follow these instructions at home: Alcohol use Do not drink alcohol if your health care provider tells you not to drink. If you drink alcohol: Limit how much you have to 0-2 drinks a day. Know how much alcohol is in your drink. In the [place] one drink equals one 12 oz bottle of beer (355 mL), one 5 oz glass of wine (148 mL), or one 1 oz glass of hard liquor (44 mL). Lifestyle Do not use any products that contain nicotine or tobacco. These products include cigarettes, chewing tobacco, and vaping devices, such as e-cigarettes. If you need help quitting, ask your health care provider. Do not use street drugs. Do not share needles. Ask your health care provider for help if you need support or information about quitting drugs. General instructions Schedule regular health, dental, and eye exams. Stay current with your vaccines. Tell your health care provider if: You often feel depressed. You have ever been abused or do not feel safe at home. Summary Adopting a healthy lifestyle and getting preventive care are important in promoting health and wellness. Follow your health care provider's instructions about healthy diet, exercising, and getting tested or screened for diseases. Follow your health care provider's instructions on monitoring your cholesterol and blood pressure. This information is not intended to replace advice given to you by your health care provider. Make sure you discuss any questions you have with your health care provider. Document Revised: 03/27/2021 Document Reviewed: 03/27/2021 Elsevier Patient Education  2024 [employer].

## 2023-10-31 NOTE — Progress Notes (Signed)
Subjective:  Patient ID: Dan Dudley, male    DOB: 1946/08/04  Age: 77 y.o. MRN: [id number]  CC: Annual Exam, Hypertension, Hyperlipidemia, and Gastroesophageal Reflux   HPI KHAI SIGLER presents for a CPX and f/up ---  Discussed the use of AI scribe software for clinical note transcription with the patient, who gave verbal consent to proceed.  History of Present Illness   The patient, with a history of gastroesophageal reflux disease (GERD), presents with a persistent cough for the past couple of months. The cough is associated with the production of clear phlegm. He denies any associated symptoms such as fever, chills, night sweats, chest pain, wheezing, shortness of breath, or weight loss.  The patient remains active, with no reported chest pain or shortness of breath during physical activity. He has been experiencing breakthrough heartburn, despite taking over-the-counter omeprazole daily. He also reports increased coffee consumption, which he suspects may be contributing to his symptoms.  The patient has been taking Tums during the day for additional relief. He has not been adhering to the recommended administration of omeprazole, taking it with orange juice and coffee, rather than on an empty stomach. He denies any difficulty or pain with swallowing.  He has no complaints of throat clearing.       Outpatient Medications Prior to Visit  Medication Sig Dispense Refill   acetaminophen (TYLENOL) 325 MG tablet Take 650 mg by mouth every 6 (six) hours as needed.     aspirin EC 325 MG tablet Take 325 mg by mouth as needed.     calcium carbonate (TUMS - DOSED IN MG ELEMENTAL CALCIUM) 500 MG chewable tablet Chew 1 tablet by mouth daily as needed for heartburn.     latanoprost (XALATAN) 0.005 % ophthalmic solution 1 drop at bedtime.     mirabegron ER (MYRBETRIQ) 50 MG TB24 tablet Take 1 tablet (50 mg total) by mouth daily. 30 tablet 4   tamsulosin (FLOMAX) 0.4 MG CAPS  capsule Take 1 capsule (0.4 mg total) by mouth daily after supper. (Patient taking differently: Take by mouth 2 (two) times daily. Takes 2 pills once daily) 30 capsule 5   omeprazole (PRILOSEC) 20 MG capsule Take 20 mg by mouth every morning.     No facility-administered medications prior to visit.    ROS Review of Systems  Constitutional:  Negative for appetite change, chills, diaphoresis, fatigue and fever.  HENT: Negative.  Negative for trouble swallowing and voice change.   Respiratory:  Positive for cough. Negative for chest tightness, shortness of breath and wheezing.   Cardiovascular:  Negative for chest pain, palpitations and leg swelling.  Gastrointestinal:  Negative for abdominal pain, constipation, diarrhea, nausea and vomiting.  Genitourinary:  Negative for difficulty urinating.  Musculoskeletal: Negative.  Negative for arthralgias and myalgias.  Skin: Negative.  Negative for color change.  Neurological: Negative.  Negative for dizziness, weakness, light-headedness and headaches.  Hematological:  Negative for adenopathy. Does not bruise/bleed easily.  Psychiatric/Behavioral: Negative.      Objective:  BP 132/86 (BP Location: Left Arm, Patient Position: Sitting, Cuff Size: Normal)   Pulse 90   Temp 98.2 F (36.8 C) (Oral)   Resp 16   Ht 5\' 10"  (1.778 m)   Wt 176 lb (79.8 kg)   SpO2 93%   BMI 25.25 kg/m   BP Readings from Last 3 Encounters:  10/31/23 132/86  02/19/23 120/70  01/22/23 120/80    Wt Readings from Last 3 Encounters:  10/31/23 176 lb (79.8  kg)  02/19/23 175 lb (79.4 kg)  01/22/23 176 lb (79.8 kg)    Physical Exam Vitals reviewed.  Constitutional:      Appearance: Normal appearance.  HENT:     Mouth/Throat:     Mouth: Mucous membranes are moist.  Eyes:     General: No scleral icterus.    Conjunctiva/sclera: Conjunctivae normal.  Cardiovascular:     Rate and Rhythm: Normal rate and regular rhythm.     Heart sounds: No murmur heard.    No  friction rub. No gallop.     Comments: EKG- NSR, 64 bpm LAD No LVH, Q waves, or ST/T waves  Pulmonary:     Effort: Pulmonary effort is normal.     Breath sounds: No stridor. No wheezing, rhonchi or rales.  Abdominal:     General: Abdomen is flat.     Palpations: There is no mass.     Tenderness: There is no abdominal tenderness. There is no guarding.     Hernia: No hernia is present.  Musculoskeletal:        General: Normal range of motion.     Cervical back: Neck supple.     Right lower leg: No edema.     Left lower leg: No edema.  Lymphadenopathy:     Cervical: No cervical adenopathy.  Skin:    General: Skin is warm and dry.     Findings: No lesion.  Neurological:     General: No focal deficit present.     Mental Status: He is alert. Mental status is at baseline.  Psychiatric:        Mood and Affect: Mood normal.        Behavior: Behavior normal.        Thought Content: Thought content normal.        Judgment: Judgment normal.     Lab Results  Component Value Date   WBC 6.0 10/31/2023   HGB 14.6 10/31/2023   HCT 44.4 10/31/2023   PLT 330.0 10/31/2023   GLUCOSE 100 (H) 10/31/2023   CHOL 199 10/31/2023   TRIG 72.0 10/31/2023   HDL 67.30 10/31/2023   LDLDIRECT 134.3 03/27/2010   LDLCALC 118 (H) 10/31/2023   ALT 11 10/31/2023   AST 16 10/31/2023   NA 141 10/31/2023   K 3.8 10/31/2023   CL 104 10/31/2023   CREATININE 0.89 10/31/2023   BUN 17 10/31/2023   CO2 29 10/31/2023   TSH 1.24 10/31/2023   PSA 0.1 05/10/2022   INR 1.00 10/08/2017   DG Chest 2 View Result Date: 11/04/2023 CLINICAL DATA:  77 year old male with cough EXAM: CHEST - 2 VIEW COMPARISON:  10/16/2021, 01/22/2023 FINDINGS: Cardiomediastinal silhouette unchanged in size and contour. No evidence of central vascular congestion. No interlobular septal thickening. No pneumothorax or pleural effusion. Coarsened interstitial markings, with no confluent airspace disease. No acute displaced fracture.  Degenerative changes of the spine. IMPRESSION: No definite radiographic evidence of acute cardiopulmonary disease Electronically Signed   By: Gilmer Mor D.O.   On: 11/04/2023 13:48     Assessment & Plan:   Essential hypertension- His BP is well controlled. EKG is negative for LVH. -     TSH; Future -     Urinalysis, Routine w reflex microscopic; Future -     CBC with Differential/Platelet; Future -     Basic metabolic panel; Future -     Hepatic function panel; Future -     EKG 12-Lead  Encounter for general adult  medical examination with abnormal findings - Exam completed, labs reviewed, vaccines reviewed, no cancer screenings indicated, pt ed material was given.   Gastroesophageal reflux disease with esophagitis without hemorrhage -     Esomeprazole Magnesium; Take 1 capsule (40 mg total) by mouth daily.  Dispense: 90 capsule; Refill: 1 -     AMB Referral [facility] Care Management -     CBC with Differential/Platelet; Future  Hyperlipidemia with target LDL less than 130- He is not willing to take a statin. -     Lipid panel; Future -     TSH; Future -     Hepatic function panel; Future  Chronic cough- CXR is normal. Will treat the GERD. -     DG Chest 2 View; Future     Follow-up: Return in about 6 months (around 04/30/2024).  Sanda Linger, MD

## 2023-11-04 ENCOUNTER — Telehealth: Payer: Self-pay

## 2023-11-04 NOTE — Progress Notes (Signed)
   Care Guide Note  11/04/2023 Name: Dan Dudley MRN: [id number] DOB: 30-Apr-1946  Referred by: Etta Grandchild, MD Reason for referral : Care Coordination (Outreach to schedule with Pharm d )   Dan Dudley is a 77 y.o. year old male who is a primary care patient of Etta Grandchild, MD. Dan Dudley was referred to the pharmacist for assistance related to  Disease management  .    An unsuccessful telephone outreach was attempted today to contact the patient who was referred to the pharmacy team for assistance with medication management. Additional attempts will be made to contact the patient.   Penne Lash , RMA     [facility]  [facility], [facility] Guide  Direct Dial: [phone]  Website: [email or web address]

## 2023-11-05 NOTE — Progress Notes (Signed)
   Care Guide Note  11/05/2023 Name: Dan Dudley MRN: [id number] DOB: 03-03-1946  Referred by: Etta Grandchild, MD Reason for referral : Care Coordination (Outreach to schedule with Pharm d )   Dan Dudley is a 77 y.o. year old male who is a primary care patient of Etta Grandchild, MD. Dan Dudley was referred to the pharmacist for assistance related to  disease management  .    Successful contact was made with the patient to discuss pharmacy services. Patient declines engagement at this time. Contact information was provided to the patient should they wish to reach out for assistance at a later time.  Penne Lash , RMA     [facility]  [facility], [facility] Guide  Direct Dial: [phone]  Website: [email or web address]

## 2023-11-07 DIAGNOSIS — C61 Malignant neoplasm of prostate: Secondary | ICD-10-CM | POA: Diagnosis not present

## 2023-11-07 DIAGNOSIS — N401 Enlarged prostate with lower urinary tract symptoms: Secondary | ICD-10-CM | POA: Diagnosis not present

## 2023-11-07 DIAGNOSIS — R351 Nocturia: Secondary | ICD-10-CM | POA: Diagnosis not present

## 2023-12-04 DIAGNOSIS — H40052 Ocular hypertension, left eye: Secondary | ICD-10-CM | POA: Diagnosis not present

## 2023-12-04 DIAGNOSIS — H04123 Dry eye syndrome of bilateral lacrimal glands: Secondary | ICD-10-CM | POA: Diagnosis not present

## 2023-12-04 DIAGNOSIS — H401111 Primary open-angle glaucoma, right eye, mild stage: Secondary | ICD-10-CM | POA: Diagnosis not present

## 2024-01-02 DIAGNOSIS — M65332 Trigger finger, left middle finger: Secondary | ICD-10-CM | POA: Insufficient documentation

## 2024-01-15 ENCOUNTER — Ambulatory Visit (INDEPENDENT_AMBULATORY_CARE_PROVIDER_SITE_OTHER): Payer: Medicare HMO

## 2024-01-15 VITALS — Ht 70.0 in | Wt 176.0 lb

## 2024-01-15 DIAGNOSIS — Z0000 Encounter for general adult medical examination without abnormal findings: Secondary | ICD-10-CM | POA: Diagnosis not present

## 2024-01-15 NOTE — Patient Instructions (Signed)
 Dan Dudley , Thank you for taking time to come for your Medicare Wellness Visit. I appreciate your ongoing commitment to your health goals. Please review the following plan we discussed and let me know if I can assist you in the future.   Referrals/Orders/Follow-Ups/Clinician Recommendations: Aim for 30 minutes of exercise or brisk walking, 6-8 glasses of water, and 5 servings of fruits and vegetables each day.   This is a list of the screening recommended for you and due dates:  Health Maintenance  Topic Date Due   COVID-19 Vaccine (6 - 2024-25 season) 10/09/2023   Medicare Annual Wellness Visit  01/14/2025   DTaP/Tdap/Td vaccine (4 - Td or Tdap) 10/05/2032   Pneumonia Vaccine  Completed   Flu Shot  Completed   Hepatitis C Screening  Completed   Zoster (Shingles) Vaccine  Completed   HPV Vaccine  Aged Out   Colon Cancer Screening  Discontinued    Advanced directives: (Copy Requested) Please bring a copy of your health care power of attorney and living will to the office to be added to your chart at your convenience.  Next Medicare Annual Wellness Visit scheduled for next year: Yes - 2026

## 2024-01-15 NOTE — Progress Notes (Addendum)
 Subjective:   Dan Dudley is a 78 y.o. who presents for a Medicare Wellness preventive visit.  Visit Complete: Virtual I connected with  Dan Dudley on 01/15/24 by a audio enabled telemedicine application and verified that I am speaking with the correct person using two identifiers.  Patient Location: Home  Provider Location: Office/Clinic  I discussed the limitations of evaluation and management by telemedicine. The patient expressed understanding and agreed to proceed.  Vital Signs: Because this visit was a virtual/telehealth visit, some criteria may be missing or patient reported. Any vitals not documented were not able to be obtained and vitals that have been documented are patient reported.  VideoDeclined- This patient declined Librarian, academic. Therefore the visit was completed with audio only.  AWV Questionnaire: No: Patient Medicare AWV questionnaire was not completed prior to this visit.  Cardiac Risk Factors include: advanced age (>1men, >53 women);hypertension;dyslipidemia;male gender     Objective:    Today's Vitals   01/15/24 1246  Weight: 176 lb (79.8 kg)  Height: 5\' 10"  (1.778 m)   Body mass index is 25.25 kg/m.     01/15/2024   12:45 PM 10/01/2022   10:15 AM 07/28/2021    5:23 PM 08/04/2019   12:04 PM 06/30/2018    9:27 AM 01/22/2018    3:25 PM 10/16/2017    8:04 AM  Advanced Directives  Does Patient Have a Medical Advance Directive? Yes Yes Yes Yes Yes Yes Yes  Type of Estate agent of [place];Living will Healthcare Power of [place];Living will Healthcare Power of [place];Living will;Out of facility DNR (pink MOST or yellow form) Healthcare Power of [place];Living will Healthcare Power of [place];Living will Healthcare Power of [place];Living will Healthcare Power of [place];Living will  Does patient want to make changes to medical advance directive?  No - Patient declined No -  Patient declined      Copy of Healthcare Power of Attorney in Chart? No - copy requested No - copy requested Yes - validated most recent copy scanned in chart (See row information) No - copy requested No - copy requested  No - copy requested    Current Medications (verified) Outpatient Encounter Medications as of 01/15/2024  Medication Sig   acetaminophen (TYLENOL) 325 MG tablet Take 650 mg by mouth every 6 (six) hours as needed.   aspirin EC 325 MG tablet Take 325 mg by mouth as needed.   calcium carbonate (TUMS - DOSED IN MG ELEMENTAL CALCIUM) 500 MG chewable tablet Chew 1 tablet by mouth daily as needed for heartburn.   esomeprazole (NEXIUM) 40 MG capsule Take 1 capsule (40 mg total) by mouth daily.   latanoprost (XALATAN) 0.005 % ophthalmic solution 1 drop at bedtime.   mirabegron ER (MYRBETRIQ) 50 MG TB24 tablet Take 1 tablet (50 mg total) by mouth daily.   tamsulosin (FLOMAX) 0.4 MG CAPS capsule Take 1 capsule (0.4 mg total) by mouth daily after supper. (Patient taking differently: Take by mouth 2 (two) times daily. Takes 2 pills once daily)   No facility-administered encounter medications on file as of 01/15/2024.    Allergies (verified) Lipitor [atorvastatin]   History: Past Medical History:  Diagnosis Date   GERD (gastroesophageal reflux disease)    Headache(784.0)    Heart murmur    "years ago"- 1990s   Hemorrhoids    internal/ external   Hyperlipidemia    per pt watching diet   Hypertension per pt thinks its white coat because at home normal  bp   per pt currently no medication as trial w/ pcp ok (was taking micardis 40mg )   Nocturia more than twice per night    Nodular hyperplasia of prostate gland    Prostate cancer [facility]) urologist-  dr budzyn/  oncologist-  dr Kathrynn Running   dx 07-15-2017 (bx)-- Stage T1c, Gleason 4+3, PSA 9.06, vol 80.2cc-- planned radiationtherpy   Past Surgical History:  Procedure Laterality Date   COLONOSCOPY  last one 08-15-2010   GOLD SEED  IMPLANT N/A 10/16/2017   Procedure: GOLD SEED IMPLANT;  Surgeon: Hildred Laser, MD;  Location: [facility];  Service: Urology;  Laterality: N/A;   HEMANGIOMA EXCISION  09/25/2004   elliptical excision left lower lip   INGUINAL HERNIA REPAIR Bilateral left 10-25-2000  dr Daphine Deutscher  right 1995 & 1980s   PROSTATE BIOPSY N/A 07/15/2017   Procedure: BIOPSY TRANSRECTAL ULTRASONIC PROSTATE (TUBP);  Surgeon: Hildred Laser, MD;  Location[facility];  Service: Urology;  Laterality: N/A;   SPACE OAR INSTILLATION N/A 10/16/2017   Procedure: SPACE OAR INSTILLATION;  Surgeon: Hildred Laser, MD;  Location: [facility];  Service: Urology;  Laterality: N/A;   Family History  Problem Relation Age of Onset   Cancer Mother        unknown   Heart disease Father        age 71, nonsmoker, heavy drinker   COPD Other    Colon cancer Neg Hx    Esophageal cancer Neg Hx    Rectal cancer Neg Hx    Stomach cancer Neg Hx    Lung disease Neg Hx    Social History   Socioeconomic History   Marital status: Married    Spouse name: Dan Dudley   Number of children: 2   Years of education: Not on file   Highest education level: 12th grade  Occupational History   Occupation: Retired    Associate Professor: SELF-EMPLOYED  Tobacco Use   Smoking status: Former    Current packs/day: 0.00    Average packs/day: 0.3 packs/day for 8.0 years (2.0 ttl pk-yrs)    Types: Cigarettes    Start date: 11/19/1960    Quit date: 11/19/1968    Years since quitting: 55.1    Passive exposure: Past   Smokeless tobacco: Never  Vaping Use   Vaping status: Never Used  Substance and Sexual Activity   Alcohol use: Not Currently   Drug use: No   Sexual activity: Yes  Other Topics Concern   Not on file  Social History Narrative   Married 1965. 1965 2 kids. Dan Dudley lives in [place], [place] -bachelor and Chartered loss adjuster; Dan Dudley in [place] with a boy and girl (21 in 2015).    Missy-toy poodle (9).        Semi retired from [employer]: enjoys part time work   Social Drivers of Corporate investment banker Strain: Low Risk  (01/15/2024)   Overall Financial Resource Strain (CARDIA)    Difficulty of Paying Living Expenses: Not hard at all  Food Insecurity: No Food Insecurity (01/15/2024)   Hunger Vital Sign    Worried About Running Out of Food in the Last Year: Never true    Ran Out of Food in the Last Year: Never true  Transportation Needs: No Transportation Needs (01/15/2024)   PRAPARE - Administrator, Civil Service (Medical): No    Lack of Transportation (Non-Medical): No  Physical Activity: Sufficiently  Active (01/15/2024)   Exercise Vital Sign    Days of Exercise per Week: 5 days    Minutes of Exercise per Session: 130 min  Recent Concern: Physical Activity - Insufficiently Active (10/27/2023)   Exercise Vital Sign    Days of Exercise per Week: 3 days    Minutes of Exercise per Session: 30 min  Stress: No Stress Concern Present (01/15/2024)   [employer] of Occupational Health - Occupational Stress Questionnaire    Feeling of Stress : Not at all  Social Connections: Moderately Isolated (01/15/2024)   Social Connection and Isolation Panel [NHANES]    Frequency of Communication with Friends and Family: More than three times a week    Frequency of Social Gatherings with Friends and Family: Never    Attends Religious Services: Never    Database administrator or Organizations: No    Attends Banker Meetings: Never    Marital Status: Married    Tobacco Counseling - Former Smoker (for 22yrs) Counseling given: Yes    Clinical Intake: Completed 01/15/2024  Pre-visit preparation completed: Yes  Pain : No/denies pain     BMI - recorded: 25.25 Nutritional Status: BMI 25 -29 Overweight Nutritional Risks: None Diabetes: No  How often do you need to have someone help you when you read instructions, pamphlets, or other  written materials from your doctor or pharmacy?: 1 - Never  Interpreter Needed?: No  Information entered by :: Hassell Halim, CMA   Activities of Daily Living Completed 01/15/2024    01/15/2024   12:48 PM  In your present state of health, do you have any difficulty performing the following activities:  Hearing? 0  Vision? 0  Difficulty concentrating or making decisions? 0  Walking or climbing stairs? 0  Dressing or bathing? 0  Doing errands, shopping? 0  Preparing Food and eating ? N  Using the Toilet? N  In the past six months, have you accidently leaked urine? N  Do you have problems with loss of bowel control? N  Managing your Medications? N  Managing your Finances? N  Housekeeping or managing your Housekeeping? N    Patient Care Team: Etta Grandchild, MD as PCP - General (Internal Medicine) Hildred Laser, MD (Inactive) (Urology)  Indicate any recent Medical Services you may have received from other than [facility] providers in the past year (date may be approximate).     Assessment:   This is a routine wellness examination for [place].  Hearing/Vision screen Hearing Screening - Comments:: Denies hearing difficulties   Vision Screening - Comments:: Wears eyeglasses for reading- up to date with routine eye exams with Dr Sherrine Maples with [facility]   Goals Addressed               This Visit's Progress     Patient Stated (pt-stated)        Patient stated he wants to remain healthy.       Depression Screen Completed 01/15/2024    01/15/2024   12:52 PM 02/19/2023    9:51 AM 01/22/2023    8:17 AM 10/03/2022    8:47 AM 10/01/2022   10:35 AM 03/21/2022    9:39 AM 07/28/2021    5:19 PM  PHQ 2/9 Scores  PHQ - 2 Score 0 0 0 0 0 0 0  PHQ- 9 Score    1       Fall Risk Completed 01/14/2025    01/15/2024   12:50 PM 02/19/2023  9:51 AM 01/22/2023    8:17 AM 10/03/2022    8:47 AM 10/01/2022   10:15 AM  Fall Risk   Falls in the past year? 0 0 0 0 0  Number falls in  past yr: 0 0 0 0 0  Injury with Fall? 0 0 0 0 0  Risk for fall due to : No Fall Risks No Fall Risks No Fall Risks No Fall Risks No Fall Risks  Follow up Falls prevention discussed;Falls evaluation completed Falls evaluation completed Falls evaluation completed Falls evaluation completed Falls evaluation completed    MEDICARE RISK AT HOME: Completed 01/15/2024 Medicare Risk at Home Any stairs in or around the home?: Yes If so, are there any without handrails?: No Home free of loose throw rugs in walkways, pet beds, electrical cords, etc?: Yes Adequate lighting in your home to reduce risk of falls?: Yes Life alert?: No Use of a cane, walker or w/c?: No Grab bars in the bathroom?: Yes Shower chair or bench in shower?: Yes Elevated toilet seat or a handicapped toilet?: No  TIMED UP AND GO:  Was the test performed?  No  Cognitive Function: 6CIT completed        01/15/2024   12:53 PM 10/01/2022   10:16 AM 07/28/2021    5:21 PM  6CIT Screen  What Year? 0 points 0 points 0 points  What month? 0 points 0 points 0 points  What time? 0 points 0 points 0 points  Count back from 20 0 points 0 points 0 points  Months in reverse 0 points 0 points 0 points  Repeat phrase 0 points 0 points 0 points  Total Score 0 points 0 points 0 points    Immunizations Immunization History  Administered Date(s) Administered   Fluad Quad(high Dose 65+) 07/15/2019, 08/03/2020, 08/06/2022   Fluad Trivalent(High Dose 65+) 08/07/2023   Influenza Split 10/04/2011, 08/19/2012   Influenza Whole 08/09/2010   Influenza, High Dose Seasonal PF 08/07/2016, 08/13/2017, 08/13/2018   Influenza,inj,Quad PF,6+ Mos 08/14/2013, 08/13/2014, 08/17/2015, 08/07/2021   Moderna Covid-19 Vaccine Bivalent Booster 63yrs & up 09/22/2021   Moderna SARS-COV2 Booster Vaccination 09/13/2020, 06/04/2021   Moderna Sars-Covid-2 Vaccination 12/15/2019, 01/12/2020   PFIZER Comirnaty(Gray Top)Covid-19 Tri-Sucrose Vaccine 08/14/2023    Pfizer(Comirnaty)Fall Seasonal Vaccine 12 years and older 09/07/2022, 08/07/2023   Pneumococcal Conjugate-13 10/19/2013   Pneumococcal Polysaccharide-23 08/17/2015, 10/16/2021   Respiratory Syncytial Virus Vaccine,Recomb Aduvanted(Arexvy) 08/06/2022   Td 11/19/2001   Tdap 09/25/2012, 10/05/2022   Zoster Recombinant(Shingrix) 08/14/2019, 10/19/2019   Zoster, Live 05/14/2011    Screening Tests Health Maintenance  Topic Date Due   COVID-19 Vaccine (6 - 2024-25 season) 10/09/2023   Medicare Annual Wellness (AWV)  01/14/2025   DTaP/Tdap/Td (4 - Td or Tdap) 10/05/2032   Pneumonia Vaccine 4+ Years old  Completed   INFLUENZA VACCINE  Completed   Hepatitis C Screening  Completed   Zoster Vaccines- Shingrix  Completed   HPV VACCINES  Aged Out   Colonoscopy  Discontinued    Health Maintenance  Health Maintenance Due  Topic Date Due   COVID-19 Vaccine (6 - 2024-25 season) 10/09/2023   Health Maintenance Items Addressed: 01/15/2024.   Colonoscopy status: Not required due to age.  Influenza vaccine status: Completed 08/07/2023  COVID vaccines: Does Qualify; Educated and advised to get at local pharmacy  Shingles vaccines: Does Qualify; Completed 10/19/2019   Additional Screening:  Vision Screening: Recommended annual ophthalmology exams for early detection of glaucoma and other disorders of the eye. Pt stated has  annual eye exam w/Dr Sherrine Maples at [facility].  Dental Screening: Recommended annual dental exams for proper oral hygiene.  Community Resource Referral / Chronic Care Management: CRR required this visit?  No   CCM required this visit?  No     Plan:     I have personally reviewed and noted the following in the patient's chart:   Medical and social history Use of alcohol, tobacco or illicit drugs  Current medications and supplements including opioid prescriptions. Patient is not currently taking opioid prescriptions. Functional ability and status Nutritional  status Physical activity Advanced directives List of other physicians Hospitalizations, surgeries, and ER visits in previous 12 months Vitals Screenings to include cognitive, depression, and falls Referrals and appointments  In addition, I have reviewed and discussed with patient certain preventive protocols, quality metrics, and best practice recommendations. A written personalized care plan for preventive services as well as general preventive health recommendations were provided to patient.     Darreld Mclean, CMA   01/15/2024   After Visit Summary: (MyChart) Due to this being a telephonic visit, the after visit summary with patients personalized plan was offered to patient via MyChart   Notes: Nothing significant to report at this time.

## 2024-02-29 ENCOUNTER — Ambulatory Visit
Admission: RE | Admit: 2024-02-29 | Discharge: 2024-02-29 | Disposition: A | Discharge status: Home / Self Care | Source: Ambulatory Visit

## 2024-02-29 VITALS — BP 155/89 | HR 98 | Temp 98.1°F | Resp 16

## 2024-02-29 DIAGNOSIS — R0981 Nasal congestion: Secondary | ICD-10-CM

## 2024-02-29 DIAGNOSIS — J0100 Acute maxillary sinusitis, unspecified: Secondary | ICD-10-CM

## 2024-02-29 MED ORDER — FLUTICASONE PROPIONATE 50 MCG/ACT NA SUSP: 2.0000 | Freq: Every day | NASAL | 0 refills | Status: AC

## 2024-02-29 MED ORDER — AZITHROMYCIN 500 MG PO TABS: 500.0000 mg | ORAL_TABLET | Freq: Every day | ORAL | 0 refills | Status: AC

## 2024-02-29 MED ORDER — PREDNISONE 20 MG PO TABS: 40.0000 mg | ORAL_TABLET | Freq: Every day | ORAL | 0 refills | Status: AC

## 2024-02-29 MED ORDER — CHLORPHEN-PE-ACETAMINOPHEN 4-10-325 MG PO TABS: 1.0000 | ORAL_TABLET | Freq: Three times a day (TID) | ORAL | 0 refills | Status: AC

## 2024-02-29 NOTE — ED Provider Notes (Signed)
 [facility]    CSN: [id number] Arrival date & time: 02/29/24  1145      History   Chief Complaint Chief Complaint  Patient presents with   Nasal Congestion    HPI Dan Dudley is a 78 y.o. male.   Subjective:   Dan Dudley is a 78 year old male presenting for evaluation of possible sinus infection. He reports a one-week history of nasal congestion, sinus pressure, runny nose, and occasional postnasal drainage.  No fevers, chills, body aches, cough, sore throat, wheeze, shortness of breath or headache.  He has been using over-the-counter sinus medications, which provide only temporary relief. He is staying well-hydrated. He denies any history of pneumonia or bronchitis. He is a non-smoker.  The following portions of the patient's history were reviewed and updated as appropriate: allergies, current medications, past family history, past medical history, past social history, past surgical history, and problem list.       Past Medical History:  Diagnosis Date   GERD (gastroesophageal reflux disease)    Headache(784.0)    Heart murmur    "years ago"- 1990s   Hemorrhoids    internal/ external   Hyperlipidemia    per pt watching diet   Hypertension per pt thinks its white coat because at home normal bp   per pt currently no medication as trial w/ pcp ok (was taking micardis 40mg )   Nocturia more than twice per night    Nodular hyperplasia of prostate gland    Prostate cancer [facility]) urologist-  dr budzyn/  oncologist-  dr Kathrynn Running   dx 07-15-2017 (bx)-- Stage T1c, Gleason 4+3, PSA 9.06, vol 80.2cc-- planned radiationtherpy    Patient Active Problem List   Diagnosis Date Noted   Acquired trigger finger of left middle finger 01/02/2024   Chronic cough 10/31/2023   Encounter for general adult medical examination with abnormal findings 10/03/2021   Vitamin D deficiency 08/12/2019   Essential hypertension 04/23/2016   GERD (gastroesophageal  reflux disease) 08/13/2014   PTSD (post-traumatic stress disorder) 05/09/2011   Stage 1 adenocarcinoma of prostate (HCC) 04/03/2010   Hyperlipidemia with target LDL less than 130 07/22/2007    Past Surgical History:  Procedure Laterality Date   COLONOSCOPY  last one 08-15-2010   GOLD SEED IMPLANT N/A 10/16/2017   Procedure: GOLD SEED IMPLANT;  Surgeon: Hildred Laser, MD;  Location[facility];  Service: Urology;  Laterality: N/A;   HEMANGIOMA EXCISION  09/25/2004   elliptical excision left lower lip   INGUINAL HERNIA REPAIR Bilateral left 10-25-2000  dr Daphine Deutscher  right 1995 & 1980s   PROSTATE BIOPSY N/A 07/15/2017   Procedure: BIOPSY TRANSRECTAL ULTRASONIC PROSTATE (TUBP);  Surgeon: Hildred Laser, MD;  Location: [facility];  Service: Urology;  Laterality: N/A;   SPACE OAR INSTILLATION N/A 10/16/2017   Procedure: SPACE OAR INSTILLATION;  Surgeon: Hildred Laser, MD;  Location: [facility];  Service: Urology;  Laterality: N/A;       Home Medications    Prior to Admission medications   Medication Sig Start Date End Date Taking? Authorizing Provider  azithromycin (ZITHROMAX) 500 MG tablet Take 1 tablet (500 mg total) by mouth daily for 5 days. 02/29/24 03/05/24 Yes Lurline Idol, FNP  Chlorphen-PE-Acetaminophen 4-10-325 MG TABS Take 1 tablet by mouth in the morning, at noon, and at bedtime. 02/29/24  Yes Lurline Idol, FNP  fluticasone (FLONASE) 50 MCG/ACT nasal spray Place 2 sprays into both nostrils daily. 02/29/24  Yes  Maryruth Sol, FNP  predniSONE (DELTASONE) 20 MG tablet Take 2 tablets (40 mg total) by mouth daily for 5 days. 02/29/24 03/05/24 Yes Sicilia Killough, FNP  acetaminophen (TYLENOL) 325 MG tablet Take 650 mg by mouth every 6 (six) hours as needed.    [provider]  aspirin EC 325 MG tablet Take 325 mg by mouth as needed.    [provider]  calcium carbonate (TUMS - DOSED IN MG  ELEMENTAL CALCIUM) 500 MG chewable tablet Chew 1 tablet by mouth daily as needed for heartburn.    [provider]  esomeprazole (NEXIUM) 40 MG capsule Take 1 capsule by mouth daily.    [provider]  latanoprost (XALATAN) 0.005 % ophthalmic solution 1 drop at bedtime. 08/24/22   [provider]  Latanoprost PF 0.005 % SOLN Place 1 drop into both eyes at bedtime.    [provider]  mirabegron ER (MYRBETRIQ) 50 MG TB24 tablet Take 1 tablet (50 mg total) by mouth daily. 02/22/18   Bruning, Ashlyn, PA-C  tamsulosin (FLOMAX) 0.4 MG CAPS capsule Take 1 capsule (0.4 mg total) by mouth daily after supper. Patient taking differently: Take by mouth 2 (two) times daily. Takes 2 pills once daily 11/07/17   Kenith Payer, MD    Family History Family History  Problem Relation Age of Onset   Cancer Mother        unknown   Heart disease Father        age 54, nonsmoker, heavy drinker   COPD Other    Colon cancer Neg Hx    Esophageal cancer Neg Hx    Rectal cancer Neg Hx    Stomach cancer Neg Hx    Lung disease Neg Hx     Social History Social History   Tobacco Use   Smoking status: Former    Current packs/day: 0.00    Average packs/day: 0.3 packs/day for 8.0 years (2.0 ttl pk-yrs)    Types: Cigarettes    Start date: 11/19/1960    Quit date: 11/19/1968    Years since quitting: 55.3    Passive exposure: Past   Smokeless tobacco: Never  Vaping Use   Vaping status: Never Used  Substance Use Topics   Alcohol use: Not Currently   Drug use: No     Allergies   Lipitor [atorvastatin]   Review of Systems Review of Systems  Constitutional:  Negative for chills, diaphoresis, fatigue and fever.  HENT:  Positive for congestion, postnasal drip, rhinorrhea, sinus pressure, sinus pain and sneezing. Negative for ear pain and sore throat.   Respiratory:  Negative for cough, shortness of breath and wheezing.   Gastrointestinal:  Negative for nausea and vomiting.   Musculoskeletal:  Negative for myalgias.  Neurological:  Negative for headaches.  All other systems reviewed and are negative.    Physical Exam Triage Vital Signs ED Triage Vitals  Encounter Vitals Group     BP 02/29/24 1159 (!) 155/89     Systolic BP Percentile --      Diastolic BP Percentile --      Pulse Rate 02/29/24 1159 98     Resp 02/29/24 1159 16     Temp 02/29/24 1159 98.1 F (36.7 C)     Temp Source 02/29/24 1159 Oral     SpO2 02/29/24 1159 96 %     Weight --      Height --      Head Circumference --      Peak Flow --  Pain Score 02/29/24 1158 5     Pain Loc --      Pain Education --      Exclude from Growth Chart --    No data found.  Updated Vital Signs BP (!) 155/89 (BP Location: Left Arm)   Pulse 98   Temp 98.1 F (36.7 C) (Oral)   Resp 16   SpO2 96%   Visual Acuity Right Eye Distance:   Left Eye Distance:   Bilateral Distance:    Right Eye Near:   Left Eye Near:    Bilateral Near:     Physical Exam Vitals reviewed.  Constitutional:      General: He is not in acute distress.    Appearance: Normal appearance. He is not ill-appearing, toxic-appearing or diaphoretic.  HENT:     Head: Normocephalic.     Right Ear: Tympanic membrane, ear canal and external ear normal.     Left Ear: Tympanic membrane, ear canal and external ear normal.     Nose: Congestion present.     Mouth/Throat:     Mouth: Mucous membranes are moist.     Pharynx: No oropharyngeal exudate or posterior oropharyngeal erythema.  Eyes:     Conjunctiva/sclera: Conjunctivae normal.  Cardiovascular:     Rate and Rhythm: Normal rate.  Pulmonary:     Effort: Pulmonary effort is normal.  Musculoskeletal:        General: Normal range of motion.     Cervical back: Normal range of motion and neck supple.  Lymphadenopathy:     Cervical: No cervical adenopathy.  Skin:    General: Skin is warm and dry.  Neurological:     General: No focal deficit present.     Mental Status:  He is alert and oriented to person, place, and time.      UC Treatments / Results  Labs (all labs ordered are listed, but only abnormal results are displayed) Labs Reviewed - No data to display  EKG   Radiology No results found.  Procedures Procedures (including critical care time)  Medications Ordered in UC Medications - No data to display  Initial Impression / Assessment and Plan / UC Course  I have reviewed the triage vital signs and the nursing notes.  Pertinent labs & imaging results that were available during my care of the patient were reviewed by me and considered in my medical decision making (see chart for details).     78 yo male presenting with a one-week history of nasal congestion, sinus pressure, runny nose, and occasional postnasal drainage. Patient is afebrile and nontoxic. Physical exam findings are consistent with an uncomplicated sinus infection. Zithromax, prednisone, Norel AD, and Flonase prescribed for symptom management. Supportive care discussed. Follow-up with primary care provider as needed.  Today's evaluation has revealed no signs of a dangerous process. Discussed diagnosis with patient and/or guardian. Patient and/or guardian aware of their diagnosis, possible red flag symptoms to watch out for and need for close follow up. Patient and/or guardian understands verbal and written discharge instructions. Patient and/or guardian comfortable with plan and disposition.  Patient and/or guardian has a clear mental status at this time, good insight into illness (after discussion and teaching) and has clear judgment to make decisions regarding their care  Documentation was completed with the aid of voice recognition software. Transcription may contain typographical errors. Final Clinical Impressions(s) / UC Diagnoses   Final diagnoses:  Nasal congestion  Acute non-recurrent maxillary sinusitis     Discharge Instructions  You have sinus infection. A  sinus infection, also called sinusitis, is inflammation of your sinuses. Take medications as prescribed. Make sure you finish all the antibiotics even if you start to feel better. For nasal congestion, use the prescribed Flonase. To use it correctly, gently blow your nose first to clear your nostrils. Shake the bottle and remove the cap. Close one nostril with your finger, and insert the spray tip into the other nostril, aiming slightly outward (away from the center of your nose). As you gently breathe in through your nose, press down on the spray to release one dose. Do not sniff too hard. If you can taste the medicine or feel it in your throat, you're using it incorrectly.You may also take tylenol and/or ibuprofen as needed for pain and/or fever. Drink enough fluid to keep your urine pale yellow. Staying hydrated will also help to thin your mucus. Use a cool mist humidifier to keep the humidity level in your home above 50%. Inhale steam for 10-15 minutes, 3-4 times a day. You can do this in the bathroom while a hot shower is running and/or purchase over-the-counter vapor shower tablets which is great to help with nasal congestion.Try to limit your exposure to cool or dry air. Sleep with your head raised to decrease post-nasal drainage. Make sure you get enough sleep each night. Change your toothbrush after finishing the antibiotics.      ED Prescriptions     Medication Sig Dispense Auth. Provider   azithromycin (ZITHROMAX) 500 MG tablet Take 1 tablet (500 mg total) by mouth daily for 5 days. 5 tablet Maryruth Sol, FNP   predniSONE (DELTASONE) 20 MG tablet Take 2 tablets (40 mg total) by mouth daily for 5 days. 10 tablet Maryruth Sol, FNP   Chlorphen-PE-Acetaminophen 4-10-325 MG TABS Take 1 tablet by mouth in the morning, at noon, and at bedtime. 21 tablet Libia Fazzini, FNP   fluticasone (FLONASE) 50 MCG/ACT nasal spray Place 2 sprays into both nostrils daily. 16 g Maryruth Sol, FNP       PDMP not reviewed this encounter.   Maryruth Sol, [place] 02/29/24 1245

## 2024-02-29 NOTE — ED Triage Notes (Signed)
 Pt states congestion and pressure in his head.  States he has been taking OTC sinus medication.  States he is prone to getting sinus infections.

## 2024-02-29 NOTE — Discharge Instructions (Addendum)
 You have sinus infection. A sinus infection, also called sinusitis, is inflammation of your sinuses. Take medications as prescribed. Make sure you finish all the antibiotics even if you start to feel better. For nasal congestion, use the prescribed Flonase. To use it correctly, gently blow your nose first to clear your nostrils. Shake the bottle and remove the cap. Close one nostril with your finger, and insert the spray tip into the other nostril, aiming slightly outward (away from the center of your nose). As you gently breathe in through your nose, press down on the spray to release one dose. Do not sniff too hard. If you can taste the medicine or feel it in your throat, you're using it incorrectly.You may also take tylenol and/or ibuprofen as needed for pain and/or fever. Drink enough fluid to keep your urine pale yellow. Staying hydrated will also help to thin your mucus. Use a cool mist humidifier to keep the humidity level in your home above 50%. Inhale steam for 10-15 minutes, 3-4 times a day. You can do this in the bathroom while a hot shower is running and/or purchase over-the-counter vapor shower tablets which is great to help with nasal congestion.Try to limit your exposure to cool or dry air. Sleep with your head raised to decrease post-nasal drainage. Make sure you get enough sleep each night. Change your toothbrush after finishing the antibiotics.

## 2024-03-28 ENCOUNTER — Ambulatory Visit: Admission: EM | Admit: 2024-03-28 | Discharge: 2024-03-28 | Disposition: A | Discharge status: Home / Self Care

## 2024-03-28 DIAGNOSIS — W57XXXA Bitten or stung by nonvenomous insect and other nonvenomous arthropods, initial encounter: Secondary | ICD-10-CM

## 2024-03-28 DIAGNOSIS — S40861A Insect bite (nonvenomous) of right upper arm, initial encounter: Secondary | ICD-10-CM | POA: Diagnosis not present

## 2024-03-28 MED ORDER — DOXYCYCLINE HYCLATE 100 MG PO TABS
200.0000 mg | ORAL_TABLET | Freq: Once | ORAL | Status: AC
  Administered 2024-03-28: 200 mg via ORAL

## 2024-03-28 NOTE — Discharge Instructions (Addendum)
 Remainder of tick removed from right axilla.  Most likely exposure is less than 24 hours given the history.  Will cover with doxycycline 200 mg once for prophylactic dose.  Continue to monitor the area for a bull's-eye rash, fevers, headaches which could still develop within the first 2 weeks but less likely with the doxycycline.  If this occurs then return to urgent care or your primary care physician.  After today may leave the area open to air.  Follow-up as needed

## 2024-03-28 NOTE — ED Triage Notes (Signed)
"  My wife found a tick in my right armpit this morning and couldn't quite get it out". "Red/irritated skin". No fever. No other sypmtoms.

## 2024-03-28 NOTE — ED Provider Notes (Signed)
 [facility]    CSN: [id number] Arrival date & time: 03/28/24  0801      History   Chief Complaint Chief Complaint  Patient presents with   Insect Bite    HPI Dan Dudley is a 78 y.o. male.   78 year old male who presents urgent care with complaints of a tick in his right axilla.  He reports that he was working in the yard yesterday.  This morning he felt an area in his right axilla and asked his wife to look at it.  She said it was a tick and attempted to remove it but was only able to get part of it out.  The patient denies any fevers, chills, headaches, bull's-eye rash, muscle aches.  He does have a little irritated area around the tick.  He did put Neosporin on the area.     Past Medical History:  Diagnosis Date   GERD (gastroesophageal reflux disease)    Headache(784.0)    Heart murmur    ((Pt Qnr Sub: "I don't have this anymore")) "years ago"- 1990s   Hemorrhoids    internal/ external   Hyperlipidemia    per pt watching diet   Hypertension per pt thinks its Danni Leabo coat because at home normal bp   per pt currently no medication as trial w/ pcp ok (was taking micardis  40mg )   Nocturia more than twice per night    Nodular hyperplasia of prostate gland    Prostate cancer [facility]) urologist-  dr Domingo Friend  oncologist-  dr Lorri Rota   dx 07-15-2017 (bx)-- Stage T1c, Gleason 4+3, PSA 9.06, vol 80.2cc-- planned radiationtherpy    Patient Active Problem List   Diagnosis Date Noted   Acquired trigger finger of left middle finger 01/02/2024   Chronic cough 10/31/2023   Encounter for general adult medical examination with abnormal findings 10/03/2021   Vitamin D  deficiency 08/12/2019   Essential hypertension 04/23/2016   GERD (gastroesophageal reflux disease) 08/13/2014   PTSD (post-traumatic stress disorder) 05/09/2011   Stage 1 adenocarcinoma of prostate (HCC) 04/03/2010   Hyperlipidemia with target LDL less than 130 07/22/2007    Past Surgical  History:  Procedure Laterality Date   COLONOSCOPY  last one 08-15-2010   GOLD SEED IMPLANT N/A 10/16/2017   Procedure: GOLD SEED IMPLANT;  Surgeon: Bart Born, MD;  Location: [facility];  Service: Urology;  Laterality: N/A;   HEMANGIOMA EXCISION  09/25/2004   elliptical excision left lower lip   INGUINAL HERNIA REPAIR Bilateral left 10-25-2000  dr Gaylyn Keas  right 1995 & 1980s   PROSTATE BIOPSY N/A 07/15/2017   Procedure: BIOPSY TRANSRECTAL ULTRASONIC PROSTATE (TUBP);  Surgeon: Bart Born, MD;  Location[facility];  Service: Urology;  Laterality: N/A;   SPACE OAR INSTILLATION N/A 10/16/2017   Procedure: SPACE OAR INSTILLATION;  Surgeon: Bart Born, MD;  Location: [facility];  Service: Urology;  Laterality: N/A;       Home Medications    Prior to Admission medications   Medication Sig Start Date End Date Taking? Authorizing Provider  aspirin  EC 325 MG tablet Take 325 mg by mouth as needed.   Yes [provider]  latanoprost (XALATAN) 0.005 % ophthalmic solution 1 drop at bedtime. 08/24/22  Yes [provider]  mirabegron  ER (MYRBETRIQ ) 50 MG TB24 tablet Take 1 tablet (50 mg total) by mouth daily. 02/22/18  Yes Bruning, Ashlyn, PA-C  tamsulosin  (FLOMAX ) 0.4 MG CAPS capsule Take 1 capsule (0.4  mg total) by mouth daily after supper. Patient taking differently: Take by mouth 2 (two) times daily. Takes 2 pills once daily 11/07/17  Yes Kenith Payer, MD  acetaminophen  (TYLENOL ) 325 MG tablet Take 650 mg by mouth every 6 (six) hours as needed.    [provider]  calcium  carbonate (TUMS - DOSED IN MG ELEMENTAL CALCIUM ) 500 MG chewable tablet Chew 1 tablet by mouth daily as needed for heartburn.    [provider]  Chlorphen-PE-Acetaminophen  4-10-325 MG TABS Take 1 tablet by mouth in the morning, at noon, and at bedtime. 02/29/24   Maryruth Sol, FNP  esomeprazole  (NEXIUM ) 40 MG capsule  Take 1 capsule by mouth daily.    [provider]  fluticasone  (FLONASE ) 50 MCG/ACT nasal spray Place 2 sprays into both nostrils daily. 02/29/24   Maryruth Sol, FNP  Latanoprost PF 0.005 % SOLN Place 1 drop into both eyes at bedtime.    [provider]  mirabegron  ER (MYRBETRIQ ) 50 MG TB24 tablet Take 1 tablet by mouth daily.    [provider]  tamsulosin  (FLOMAX ) 0.4 MG CAPS capsule Take 1 capsule by mouth 2 (two) times daily.    [provider]    Family History Family History  Problem Relation Age of Onset   Cancer Mother        unknown   Heart disease Father        age 72, nonsmoker, heavy drinker   Alcohol abuse Father    Early death Father    COPD Other    Colon cancer Neg Hx    Esophageal cancer Neg Hx    Rectal cancer Neg Hx    Stomach cancer Neg Hx    Lung disease Neg Hx     Social History Social History   Tobacco Use   Smoking status: Former    Current packs/day: 0.00    Average packs/day: 0.3 packs/day for 8.0 years (2.0 ttl pk-yrs)    Types: Cigarettes    Start date: 11/19/1960    Quit date: 11/19/1968    Years since quitting: 55.3    Passive exposure: Past   Smokeless tobacco: Never  Vaping Use   Vaping status: Never Used  Substance Use Topics   Alcohol use: Not Currently   Drug use: No     Allergies   Lipitor [atorvastatin ]   Review of Systems Review of Systems  Constitutional:  Negative for chills and fever.  HENT:  Negative for ear pain and sore throat.   Eyes:  Negative for pain and visual disturbance.  Respiratory:  Negative for cough and shortness of breath.   Cardiovascular:  Negative for chest pain and palpitations.  Gastrointestinal:  Negative for abdominal pain and vomiting.  Genitourinary:  Negative for dysuria and hematuria.  Musculoskeletal:  Negative for arthralgias and back pain.  Skin:  Negative for color change and rash.       Tick in right axilla  Neurological:  Negative for seizures and  syncope.  All other systems reviewed and are negative.    Physical Exam Triage Vital Signs ED Triage Vitals  Encounter Vitals Group     BP 03/28/24 0810 (!) 143/83     Systolic BP Percentile --      Diastolic BP Percentile --      Pulse Rate 03/28/24 0810 91     Resp 03/28/24 0810 18     Temp 03/28/24 0810 98.9 F (37.2 C)     Temp Source 03/28/24 0810 Oral  SpO2 03/28/24 0810 96 %     Weight 03/28/24 0808 175 lb 14.8 oz (79.8 kg)     Height 03/28/24 0808 5\' 10"  (1.778 m)     Head Circumference --      Peak Flow --      Pain Score 03/28/24 0806 0     Pain Loc --      Pain Education --      Exclude from Growth Chart --    No data found.  Updated Vital Signs BP (!) 143/83 (BP Location: Left Arm)   Pulse 91   Temp 98.9 F (37.2 C) (Oral)   Resp 18   Ht 5\' 10"  (1.778 m)   Wt 175 lb 14.8 oz (79.8 kg)   SpO2 96%   BMI 25.24 kg/m   Visual Acuity Right Eye Distance:   Left Eye Distance:   Bilateral Distance:    Right Eye Near:   Left Eye Near:    Bilateral Near:     Physical Exam Vitals and nursing note reviewed.  Constitutional:      General: He is not in acute distress.    Appearance: He is well-developed.  HENT:     Head: Normocephalic and atraumatic.  Eyes:     Conjunctiva/sclera: Conjunctivae normal.  Cardiovascular:     Rate and Rhythm: Normal rate.     Heart sounds: No murmur heard. Pulmonary:     Effort: Pulmonary effort is normal. No respiratory distress.  Abdominal:     Palpations: Abdomen is soft.  Musculoskeletal:        General: No swelling.     Cervical back: Neck supple.  Skin:    General: Skin is warm and dry.     Capillary Refill: Capillary refill takes less than 2 seconds.  Neurological:     Mental Status: He is alert.  Psychiatric:        Mood and Affect: Mood normal.      UC Treatments / Results  Labs (all labs ordered are listed, but only abnormal results are displayed) Labs Reviewed - No data to  display  EKG   Radiology No results found.  Procedures Procedures (including critical care time)  Medications Ordered in UC Medications  doxycycline (VIBRA-TABS) tablet 200 mg (has no administration in time range)    Initial Impression / Assessment and Plan / UC Course  I have reviewed the triage vital signs and the nursing notes.  Pertinent labs & imaging results that were available during my care of the patient were reviewed by me and considered in my medical decision making (see chart for details).     Tick bite of right upper arm, initial encounter   Remainder of tick removed from right axilla. Likely low risk given that the patient was working in the yard yesterday. We will cover the patient with doxycycline 200 mg x 1 which we have given today.  Advised the patient to monitor the area for bull's-eye rash.  Also look for fevers or headaches that could develop within the first 2 weeks but less likely with the prophylactic dose.  Recommend keeping the dressing on the area until tomorrow then may leave open to air.  Patient has to follow-up with urgent care as needed.  Final Clinical Impressions(s) / UC Diagnoses   Final diagnoses:  Tick bite of right upper arm, initial encounter   Discharge Instructions      Remainder of tick removed from right axilla.  Most likely exposure is less  than 24 hours given the history.  Will cover with doxycycline 200 mg once for prophylactic dose.  Continue to monitor the area for a bull's-eye rash, fevers, headaches which could still develop within the first 2 weeks but less likely with the doxycycline.  If this occurs then return to urgent care or your primary care physician.  After today may leave the area open to air.  Follow-up as needed  ED Prescriptions   None    PDMP not reviewed this encounter.   Kreg Pesa, [place] 03/28/24 [phone]

## 2024-06-03 ENCOUNTER — Other Ambulatory Visit: Payer: Self-pay | Admitting: Internal Medicine

## 2024-06-03 DIAGNOSIS — K2100 Gastro-esophageal reflux disease with esophagitis, without bleeding: Secondary | ICD-10-CM

## 2024-06-03 DIAGNOSIS — H40052 Ocular hypertension, left eye: Secondary | ICD-10-CM | POA: Diagnosis not present

## 2024-06-03 DIAGNOSIS — H2513 Age-related nuclear cataract, bilateral: Secondary | ICD-10-CM | POA: Diagnosis not present

## 2024-06-03 DIAGNOSIS — H04123 Dry eye syndrome of bilateral lacrimal glands: Secondary | ICD-10-CM | POA: Diagnosis not present

## 2024-06-03 DIAGNOSIS — H401111 Primary open-angle glaucoma, right eye, mild stage: Secondary | ICD-10-CM | POA: Diagnosis not present

## 2024-06-29 DIAGNOSIS — M65332 Trigger finger, left middle finger: Secondary | ICD-10-CM | POA: Diagnosis not present

## 2024-06-29 DIAGNOSIS — M65341 Trigger finger, right ring finger: Secondary | ICD-10-CM | POA: Diagnosis not present

## 2024-07-26 ENCOUNTER — Ambulatory Visit
Admission: RE | Admit: 2024-07-26 | Discharge: 2024-07-26 | Disposition: A | Discharge status: Home / Self Care | Payer: Self-pay | Source: Ambulatory Visit | Attending: Emergency Medicine | Admitting: Emergency Medicine

## 2024-07-26 VITALS — BP 141/82 | HR 89 | Temp 98.4°F | Resp 18 | Wt 175.9 lb

## 2024-07-26 DIAGNOSIS — U071 COVID-19: Secondary | ICD-10-CM | POA: Diagnosis not present

## 2024-07-26 LAB — POC SOFIA SARS ANTIGEN FIA: SARS Coronavirus 2 Ag: POSITIVE — AB

## 2024-07-26 NOTE — Discharge Instructions (Signed)
 Your covid test was positive  Treat supportively. Continue throat lozenges, tylenol , and hydration.  Please return if needed; especially if any symptoms are worsening

## 2024-07-26 NOTE — ED Triage Notes (Addendum)
 Pt presents c/o URI sxs x 3 days. Pt reports he started getting sore throat on Friday afternoon as well as dry cough, body aches, and headaches. Pt denies emesis and diarrhea. Pt also reports extreme fatigue. Pt is requesting COVID test.

## 2024-07-26 NOTE — ED Provider Notes (Signed)
 [facility]    CSN: [id number] Arrival date & time: 07/26/24  1250     History   Chief Complaint Chief Complaint  Patient presents with   URI    Possible Covid ? - Entered by patient    HPI Dan Dudley is a 78 y.o. male.  2 days ago he had sore throat and headache, and they were bad yesterday but they have resolved today. Now only having some slight nasal drainage and minimal cough No fever  Using lozenges and tylenol  that are helpful  Unsure of sick contacts   Past Medical History:  Diagnosis Date   GERD (gastroesophageal reflux disease)    Headache(784.0)    Heart murmur    ((Pt Qnr Sub: I don't have this anymore)) years ago- 1990s   Hemorrhoids    internal/ external   Hyperlipidemia    per pt watching diet   Hypertension per pt thinks its white coat because at home normal bp   per pt currently no medication as trial w/ pcp ok (was taking micardis  40mg )   Nocturia more than twice per night    Nodular hyperplasia of prostate gland    Prostate cancer [facility]) urologist-  dr lamon  oncologist-  dr patrcia   dx 07-15-2017 (bx)-- Stage T1c, Gleason 4+3, PSA 9.06, vol 80.2cc-- planned radiationtherpy    Patient Active Problem List   Diagnosis Date Noted   Acquired trigger finger of left middle finger 01/02/2024   Chronic cough 10/31/2023   Encounter for general adult medical examination with abnormal findings 10/03/2021   Vitamin D  deficiency 08/12/2019   Essential hypertension 04/23/2016   GERD (gastroesophageal reflux disease) 08/13/2014   PTSD (post-traumatic stress disorder) 05/09/2011   Stage 1 adenocarcinoma of prostate (HCC) 04/03/2010   Hyperlipidemia with target LDL less than 130 07/22/2007    Past Surgical History:  Procedure Laterality Date   COLONOSCOPY  last one 08-15-2010   GOLD SEED IMPLANT N/A 10/16/2017   Procedure: GOLD SEED IMPLANT;  Surgeon: Chauncey Redell Agent, MD;  Location: [facility];  Service:  Urology;  Laterality: N/A;   HEMANGIOMA EXCISION  09/25/2004   elliptical excision left lower lip   INGUINAL HERNIA REPAIR Bilateral left 10-25-2000  dr sylvester  right 1995 & 1980s   PROSTATE BIOPSY N/A 07/15/2017   Procedure: BIOPSY TRANSRECTAL ULTRASONIC PROSTATE (TUBP);  Surgeon: Chauncey Redell Agent, MD;  Location: [facility];  Service: Urology;  Laterality: N/A;   SPACE OAR INSTILLATION N/A 10/16/2017   Procedure: SPACE OAR INSTILLATION;  Surgeon: Chauncey Redell Agent, MD;  Location: [facility];  Service: Urology;  Laterality: N/A;       Home Medications    Prior to Admission medications   Medication Sig Start Date End Date Taking? Authorizing Provider  tamsulosin  (FLOMAX ) 0.4 MG CAPS capsule Take 1 capsule by mouth 2 (two) times daily.   Yes [provider]  acetaminophen  (TYLENOL ) 325 MG tablet Take 650 mg by mouth every 6 (six) hours as needed.    [provider]  aspirin  EC 325 MG tablet Take 325 mg by mouth as needed.    [provider]  calcium  carbonate (TUMS - DOSED IN MG ELEMENTAL CALCIUM ) 500 MG chewable tablet Chew 1 tablet by mouth daily as needed for heartburn.    [provider]  Chlorphen-PE-Acetaminophen  4-10-325 MG TABS Take 1 tablet by mouth in the morning, at noon, and at bedtime. 02/29/24   Iola Lukes, FNP  esomeprazole  (NEXIUM ) 40 MG capsule TAKE 1 CAPSULE BY MOUTH DAILY 06/09/24   Joshua Debby CROME, MD  fluticasone  (FLONASE ) 50 MCG/ACT nasal spray Place 2 sprays into both nostrils daily. 02/29/24   Murrill, Samantha, FNP  latanoprost (XALATAN) 0.005 % ophthalmic solution 1 drop at bedtime. 08/24/22   [provider]  Latanoprost PF 0.005 % SOLN Place 1 drop into both eyes at bedtime.    [provider]  mirabegron  ER (MYRBETRIQ ) 50 MG TB24 tablet Take 1 tablet (50 mg total) by mouth daily. 02/22/18   Bruning, Ashlyn, PA-C  mirabegron  ER (MYRBETRIQ ) 50 MG TB24 tablet Take 1 tablet by  mouth daily.    [provider]  tamsulosin  (FLOMAX ) 0.4 MG CAPS capsule Take 1 capsule (0.4 mg total) by mouth daily after supper. Patient taking differently: Take by mouth 2 (two) times daily. Takes 2 pills once daily 11/07/17   Patrcia Cough, MD    Family History Family History  Problem Relation Age of Onset   Cancer Mother        unknown   Heart disease Father        age 50, nonsmoker, heavy drinker   Alcohol abuse Father    Early death Father    COPD Other    Colon cancer Neg Hx    Esophageal cancer Neg Hx    Rectal cancer Neg Hx    Stomach cancer Neg Hx    Lung disease Neg Hx     Social History Social History   Tobacco Use   Smoking status: Former    Current packs/day: 0.00    Average packs/day: 0.3 packs/day for 8.0 years (2.0 ttl pk-yrs)    Types: Cigarettes    Start date: 11/19/1960    Quit date: 11/19/1968    Years since quitting: 55.7    Passive exposure: Past   Smokeless tobacco: Never  Vaping Use   Vaping status: Never Used  Substance Use Topics   Alcohol use: Not Currently   Drug use: No     Allergies   Lipitor [atorvastatin ]   Review of Systems Review of Systems As per HPI  Physical Exam Triage Vital Signs ED Triage Vitals  Encounter Vitals Group     BP 07/26/24 1314 (!) 141/82     Girls Systolic BP Percentile --      Girls Diastolic BP Percentile --      Boys Systolic BP Percentile --      Boys Diastolic BP Percentile --      Pulse Rate 07/26/24 1314 89     Resp 07/26/24 1314 18     Temp 07/26/24 1314 98.4 F (36.9 C)     Temp Source 07/26/24 1314 Oral     SpO2 07/26/24 1314 97 %     Weight 07/26/24 1312 175 lb 14.8 oz (79.8 kg)     Height --      Head Circumference --      Peak Flow --      Pain Score 07/26/24 1312 0     Pain Loc --      Pain Education --      Exclude from Growth Chart --    No data found.  Updated Vital Signs BP (!) 141/82 (BP Location: Right Arm)   Pulse 89   Temp 98.4 F (36.9 C) (Oral)    Resp 18   Wt 175 lb 14.8 oz (79.8 kg)   SpO2 97%   BMI 25.24 kg/m   Physical Exam  Vitals and nursing note reviewed.  Constitutional:      General: He is not in acute distress.    Appearance: He is not ill-appearing or diaphoretic.  HENT:     Right Ear: Tympanic membrane and ear canal normal.     Left Ear: Tympanic membrane and ear canal normal.     Nose: No congestion or rhinorrhea.     Mouth/Throat:     Mouth: Mucous membranes are moist.     Pharynx: Oropharynx is clear. No posterior oropharyngeal erythema.  Eyes:     Conjunctiva/sclera: Conjunctivae normal.  Cardiovascular:     Rate and Rhythm: Normal rate and regular rhythm.     Pulses: Normal pulses.     Heart sounds: Normal heart sounds.  Pulmonary:     Effort: Pulmonary effort is normal.     Breath sounds: Normal breath sounds.  Musculoskeletal:     Cervical back: Normal range of motion.  Lymphadenopathy:     Cervical: No cervical adenopathy.  Skin:    General: Skin is warm and dry.  Neurological:     Mental Status: He is alert and oriented to person, place, and time.      UC Treatments / Results  Labs (all labs ordered are listed, but only abnormal results are displayed) Labs Reviewed  POC SOFIA SARS ANTIGEN FIA - Abnormal; Notable for the following components:      Result Value   SARS Coronavirus 2 Ag Positive (*)    All other components within normal limits    EKG   Radiology No results found.  Procedures Procedures (including critical care time)  Medications Ordered in UC Medications - No data to display  Initial Impression / Assessment and Plan / UC Course  I have reviewed the triage vital signs and the nursing notes.  Pertinent labs & imaging results that were available during my care of the patient were reviewed by me and considered in my medical decision making (see chart for details).  Rapid COVID positive Afebrile in clinic.  He is overall well-appearing, clear lungs.  Reports he is  already feeling almost back to his normal.  We had discussed possibility of Paxlovid , but since he is feeling well at this time, shared decision making we will defer antivirals in place of supportive care at home.  Discussed reasons to return to clinic.  Patient is agreeable with plan, no questions  Final Clinical Impressions(s) / UC Diagnoses   Final diagnoses:  COVID-19     Discharge Instructions      Your covid test was positive  Treat supportively. Continue throat lozenges, tylenol , and hydration.  Please return if needed; especially if any symptoms are worsening     ED Prescriptions   None    PDMP not reviewed this encounter.   Estephanie Hubbs, [place], PA-C 07/26/24 1357

## 2024-08-24 ENCOUNTER — Other Ambulatory Visit (HOSPITAL_BASED_OUTPATIENT_CLINIC_OR_DEPARTMENT_OTHER): Payer: Self-pay

## 2024-08-24 MED ORDER — FLUZONE HIGH-DOSE 0.5 ML IM SUSY
0.5000 mL | PREFILLED_SYRINGE | Freq: Once | INTRAMUSCULAR | 0 refills | Status: AC
  Filled 2024-08-24: qty 0.5, 1d supply, fill #0

## 2024-08-24 MED ORDER — COMIRNATY 30 MCG/0.3ML IM SUSY
0.3000 mL | PREFILLED_SYRINGE | Freq: Once | INTRAMUSCULAR | 0 refills | Status: AC
  Filled 2024-08-24: qty 0.3, 1d supply, fill #0

## 2024-10-26 DIAGNOSIS — C61 Malignant neoplasm of prostate: Secondary | ICD-10-CM | POA: Diagnosis not present

## 2024-11-02 ENCOUNTER — Encounter: Payer: Self-pay | Admitting: Internal Medicine

## 2024-11-02 ENCOUNTER — Ambulatory Visit: Admitting: Internal Medicine

## 2024-11-02 ENCOUNTER — Encounter: Payer: Medicare HMO | Admitting: Internal Medicine

## 2024-11-02 ENCOUNTER — Ambulatory Visit: Payer: Self-pay | Admitting: Internal Medicine

## 2024-11-02 VITALS — BP 142/78 | HR 80 | Temp 98.1°F | Resp 16 | Ht 70.0 in | Wt 172.6 lb

## 2024-11-02 DIAGNOSIS — N401 Enlarged prostate with lower urinary tract symptoms: Secondary | ICD-10-CM | POA: Diagnosis not present

## 2024-11-02 DIAGNOSIS — Z0000 Encounter for general adult medical examination without abnormal findings: Secondary | ICD-10-CM

## 2024-11-02 DIAGNOSIS — E785 Hyperlipidemia, unspecified: Secondary | ICD-10-CM | POA: Diagnosis not present

## 2024-11-02 DIAGNOSIS — E559 Vitamin D deficiency, unspecified: Secondary | ICD-10-CM

## 2024-11-02 DIAGNOSIS — Z8546 Personal history of malignant neoplasm of prostate: Secondary | ICD-10-CM | POA: Insufficient documentation

## 2024-11-02 DIAGNOSIS — C61 Malignant neoplasm of prostate: Secondary | ICD-10-CM | POA: Diagnosis not present

## 2024-11-02 DIAGNOSIS — I10 Essential (primary) hypertension: Secondary | ICD-10-CM

## 2024-11-02 DIAGNOSIS — K2100 Gastro-esophageal reflux disease with esophagitis, without bleeding: Secondary | ICD-10-CM | POA: Diagnosis not present

## 2024-11-02 DIAGNOSIS — Z0001 Encounter for general adult medical examination with abnormal findings: Secondary | ICD-10-CM

## 2024-11-02 DIAGNOSIS — R3915 Urgency of urination: Secondary | ICD-10-CM | POA: Diagnosis not present

## 2024-11-02 LAB — TSH: TSH: 1.04 u[IU]/mL (ref 0.35–5.50)

## 2024-11-02 LAB — HEPATIC FUNCTION PANEL
ALT: 11 U/L (ref 0–53)
AST: 15 U/L (ref 0–37)
Albumin: 4.7 g/dL (ref 3.5–5.2)
Alkaline Phosphatase: 53 U/L (ref 39–117)
Bilirubin, Direct: 0.1 mg/dL (ref 0.0–0.3)
Total Bilirubin: 0.8 mg/dL (ref 0.2–1.2)
Total Protein: 6.8 g/dL (ref 6.0–8.3)

## 2024-11-02 LAB — VITAMIN D 25 HYDROXY (VIT D DEFICIENCY, FRACTURES): VITD: 13.03 ng/mL — ABNORMAL LOW (ref 30.00–100.00)

## 2024-11-02 LAB — CBC WITH DIFFERENTIAL/PLATELET
Basophils Absolute: 0 K/uL (ref 0.0–0.1)
Basophils Relative: 0.9 % (ref 0.0–3.0)
Eosinophils Absolute: 0 K/uL (ref 0.0–0.7)
Eosinophils Relative: 0.5 % (ref 0.0–5.0)
HCT: 43.3 % (ref 39.0–52.0)
Hemoglobin: 14.5 g/dL (ref 13.0–17.0)
Lymphocytes Relative: 17.5 % (ref 12.0–46.0)
Lymphs Abs: 0.9 K/uL (ref 0.7–4.0)
MCHC: 33.6 g/dL (ref 30.0–36.0)
MCV: 91.2 fl (ref 78.0–100.0)
Monocytes Absolute: 0.4 K/uL (ref 0.1–1.0)
Monocytes Relative: 7 % (ref 3.0–12.0)
Neutro Abs: 4 K/uL (ref 1.4–7.7)
Neutrophils Relative %: 74.1 % (ref 43.0–77.0)
Platelets: 315 K/uL (ref 150.0–400.0)
RBC: 4.74 Mil/uL (ref 4.22–5.81)
RDW: 14.2 % (ref 11.5–15.5)
WBC: 5.4 K/uL (ref 4.0–10.5)

## 2024-11-02 LAB — LIPID PANEL
Cholesterol: 201 mg/dL — ABNORMAL HIGH (ref 0–200)
HDL: 71.7 mg/dL (ref >39.00)
LDL Cholesterol: 119 mg/dL — ABNORMAL HIGH (ref 0–99)
NonHDL: 129.56
Total CHOL/HDL Ratio: 3
Triglycerides: 55 mg/dL (ref 0.0–149.0)
VLDL: 11 mg/dL (ref 0.0–40.0)

## 2024-11-02 LAB — BASIC METABOLIC PANEL WITH GFR
BUN: 15 mg/dL (ref 6–23)
CO2: 30 meq/L (ref 19–32)
Calcium: 9.6 mg/dL (ref 8.4–10.5)
Chloride: 104 meq/L (ref 96–112)
Creatinine, Ser: 0.86 mg/dL (ref 0.40–1.50)
GFR: 82.04 mL/min (ref >60.00)
Glucose, Bld: 107 mg/dL — ABNORMAL HIGH (ref 70–99)
Potassium: 4.2 meq/L (ref 3.5–5.1)
Sodium: 140 meq/L (ref 135–145)

## 2024-11-02 LAB — PSA: PSA: 0.2

## 2024-11-02 MED ORDER — CHOLECALCIFEROL 1.25 MG (50000 UT) PO CAPS: 50000.0000 [IU] | ORAL_CAPSULE | ORAL | 0 refills | Status: AC

## 2024-11-02 MED ORDER — FAMOTIDINE 20 MG PO TABS: 20.0000 mg | ORAL_TABLET | Freq: Every day | ORAL | 1 refills | Status: AC

## 2024-11-02 NOTE — Progress Notes (Signed)
 Subjective:  Patient ID: Dan Dudley, male    DOB: Jan 28, 1946  Age: 78 y.o. MRN: [id number]  CC: Annual Exam, Hypertension, Gastroesophageal Reflux, and Hyperlipidemia   HPI Dan Dudley presents for a CPX and f/up ---   Discussed the use of AI scribe software for clinical note transcription with the patient, who gave verbal consent to proceed.  History of Present Illness Dan Dudley is an 78 year old male who presents for a routine follow-up visit.  He takes esomeprazole  40 mg daily for heartburn but continues to experience a buildup of clear phlegm, which he occasionally spits out. The phlegm can cause coughing, especially if he does not have a lozenge to ease it. No trouble swallowing or painful swallowing.  He mentions a problem with his right eye, described as 'fuzzy', and recalls that his eye doctor said it may be related to glaucoma. He is uncertain if surgery will be required.  He confirms that his bowels are moving well.  No symptoms of high blood pressure, chest pain, shortness of breath, or dizziness. He remains active and feels well during physical activity.     Outpatient Medications Prior to Visit  Medication Sig Dispense Refill   acetaminophen  (TYLENOL ) 325 MG tablet Take 650 mg by mouth every 6 (six) hours as needed.     aspirin  EC 325 MG tablet Take 325 mg by mouth as needed.     calcium  carbonate (TUMS - DOSED IN MG ELEMENTAL CALCIUM ) 500 MG chewable tablet Chew 1 tablet by mouth daily as needed for heartburn.     Chlorphen-PE-Acetaminophen  4-10-325 MG TABS Take 1 tablet by mouth in the morning, at noon, and at bedtime. 21 tablet 0   esomeprazole  (NEXIUM ) 40 MG capsule TAKE 1 CAPSULE BY MOUTH DAILY 90 capsule 1   fluticasone  (FLONASE ) 50 MCG/ACT nasal spray Place 2 sprays into both nostrils daily. 16 g 0   latanoprost (XALATAN) 0.005 % ophthalmic solution 1 drop at bedtime.     Latanoprost PF 0.005 % SOLN Place 1 drop into both eyes  at bedtime.     mirabegron  ER (MYRBETRIQ ) 50 MG TB24 tablet Take 1 tablet by mouth daily.     tamsulosin  (FLOMAX ) 0.4 MG CAPS capsule Take 1 capsule by mouth 2 (two) times daily.     mirabegron  ER (MYRBETRIQ ) 50 MG TB24 tablet Take 1 tablet (50 mg total) by mouth daily. 30 tablet 4   tamsulosin  (FLOMAX ) 0.4 MG CAPS capsule Take 1 capsule (0.4 mg total) by mouth daily after supper. (Patient taking differently: Take by mouth 2 (two) times daily. Takes 2 pills once daily) 30 capsule 5   No facility-administered medications prior to visit.    ROS Review of Systems  Constitutional: Negative.  Negative for appetite change, chills, diaphoresis, fatigue and fever.  HENT:  Positive for postnasal drip and rhinorrhea. Negative for nosebleeds, sinus pressure, sneezing and sore throat.   Eyes:  Positive for visual disturbance. Negative for photophobia and redness.  Respiratory:  Positive for cough. Negative for chest tightness, shortness of breath and wheezing.   Cardiovascular:  Negative for chest pain, palpitations and leg swelling.  Gastrointestinal:  Negative for abdominal pain, constipation, diarrhea, nausea and vomiting.  Genitourinary:  Negative for difficulty urinating.  Musculoskeletal: Negative.   Skin: Negative.   Neurological:  Negative for dizziness, weakness and headaches.  Hematological:  Negative for adenopathy. Does not bruise/bleed easily.  Psychiatric/Behavioral: Negative.      Objective:  BP (!) 142/78 (BP  Location: Left Arm, Patient Position: Sitting, Cuff Size: Normal)   Pulse 80   Temp 98.1 F (36.7 C) (Oral)   Resp 16   Ht 5' 10 (1.778 m)   Wt 172 lb 9.6 oz (78.3 kg)   SpO2 97%   BMI 24.77 kg/m   BP Readings from Last 3 Encounters:  11/02/24 (!) 142/78  07/26/24 (!) 141/82  03/28/24 (!) 143/83    Wt Readings from Last 3 Encounters:  11/02/24 172 lb 9.6 oz (78.3 kg)  07/26/24 175 lb 14.8 oz (79.8 kg)  03/28/24 175 lb 14.8 oz (79.8 kg)    Physical  Exam Vitals reviewed.  Constitutional:      Appearance: Normal appearance.  HENT:     Nose: Nose normal.     Mouth/Throat:     Mouth: Mucous membranes are moist.  Eyes:     General: No scleral icterus.    Conjunctiva/sclera: Conjunctivae normal.  Cardiovascular:     Rate and Rhythm: Normal rate and regular rhythm.     Heart sounds: No murmur heard.    No friction rub. No gallop.     Comments: EKG-- NSR, 73 bpm No LVH, Q waves, or ST/T wave changes  Pulmonary:     Effort: Pulmonary effort is normal.     Breath sounds: No stridor. Examination of the left-lower field reveals rales. Rales present. No decreased breath sounds, wheezing or rhonchi.  Chest:     Chest wall: No tenderness.  Abdominal:     General: Abdomen is flat.     Palpations: There is no mass.     Tenderness: There is no abdominal tenderness. There is no guarding.     Hernia: No hernia is present.  Musculoskeletal:        General: Normal range of motion.     Cervical back: Neck supple.     Right lower leg: No edema.     Left lower leg: No edema.  Lymphadenopathy:     Cervical: No cervical adenopathy.  Skin:    General: Skin is warm and dry.     Findings: No rash.  Neurological:     General: No focal deficit present.     Mental Status: He is alert.  Psychiatric:        Mood and Affect: Mood normal.        Behavior: Behavior normal.     Lab Results  Component Value Date   WBC 5.4 11/02/2024   HGB 14.5 11/02/2024   HCT 43.3 11/02/2024   PLT 315.0 11/02/2024   GLUCOSE 107 (H) 11/02/2024   CHOL 201 (H) 11/02/2024   TRIG 55.0 11/02/2024   HDL 71.70 11/02/2024   LDLDIRECT 134.3 03/27/2010   LDLCALC 119 (H) 11/02/2024   ALT 11 11/02/2024   AST 15 11/02/2024   NA 140 11/02/2024   K 4.2 11/02/2024   CL 104 11/02/2024   CREATININE 0.86 11/02/2024   BUN 15 11/02/2024   CO2 30 11/02/2024   TSH 1.04 11/02/2024   PSA 0.2 11/02/2024   INR 1.00 10/08/2017    Estimated Creatinine Clearance: 70.7 mL/min  (by C-G formula based on SCr of 0.86 mg/dL).   The ASCVD Risk score (Arnett DK, et al., 2019) failed to calculate for the following reasons:   The 2019 ASCVD risk score is only valid for ages 52 to 60   * - Cholesterol units were assumed   Assessment & Plan:   Hyperlipidemia with target LDL less than 130- He does  not tolerate statins. -     Lipid panel; Future -     TSH; Future -     Hepatic function panel; Future  Essential hypertension- BP is well controlled. -     TSH; Future -     CBC with Differential/Platelet; Future -     Basic metabolic panel with GFR; Future -     EKG 12-Lead  Vitamin D  deficiency -     VITAMIN D  25 Hydroxy (Vit-D Deficiency, Fractures); Future -     Cholecalciferol ; Take 1 capsule (50,000 Units total) by mouth once a week.  Dispense: 12 capsule; Refill: 0  Encounter for general adult medical examination with abnormal findings- Exam completed, labs reviewed, vaccines reviewed and updated,  no cancer screenings indicated, pt ed material was given.   Gastroesophageal reflux disease with esophagitis without hemorrhage- Will add an H2 blocker. -     Famotidine ; Take 1 tablet (20 mg total) by mouth at bedtime.  Dispense: 90 tablet; Refill: 1 -     CBC with Differential/Platelet; Future -     AMB Referral VBCI Care Management  History of prostate cancer     Follow-up: Return in about 6 months (around 05/03/2025).  Debby Molt, MD

## 2024-11-02 NOTE — Patient Instructions (Signed)
 Health Maintenance, Male  Adopting a healthy lifestyle and getting preventive care are important in promoting health and wellness. Ask your health care provider about:  The right schedule for you to have regular tests and exams.  Things you can do on your own to prevent diseases and keep yourself healthy.  What should I know about diet, weight, and exercise?  Eat a healthy diet    Eat a diet that includes plenty of vegetables, fruits, low-fat dairy products, and lean protein.  Do not eat a lot of foods that are high in solid fats, added sugars, or sodium.  Maintain a healthy weight  Body mass index (BMI) is a measurement that can be used to identify possible weight problems. It estimates body fat based on height and weight. Your health care provider can help determine your BMI and help you achieve or maintain a healthy weight.  Get regular exercise  Get regular exercise. This is one of the most important things you can do for your health. Most adults should:  Exercise for at least 150 minutes each week. The exercise should increase your heart rate and make you sweat (moderate-intensity exercise).  Do strengthening exercises at least twice a week. This is in addition to the moderate-intensity exercise.  Spend less time sitting. Even light physical activity can be beneficial.  Watch cholesterol and blood lipids  Have your blood tested for lipids and cholesterol at 80 years of age, then have this test every 5 years.  You may need to have your cholesterol levels checked more often if:  Your lipid or cholesterol levels are high.  You are older than 80 years of age.  You are at high risk for heart disease.  What should I know about cancer screening?  Many types of cancers can be detected early and may often be prevented. Depending on your health history and family history, you may need to have cancer screening at various ages. This may include screening for:  Colorectal cancer.  Prostate cancer.  Skin cancer.  Lung  cancer.  What should I know about heart disease, diabetes, and high blood pressure?  Blood pressure and heart disease  High blood pressure causes heart disease and increases the risk of stroke. This is more likely to develop in people who have high blood pressure readings or are overweight.  Talk with your health care provider about your target blood pressure readings.  Have your blood pressure checked:  Every 3-5 years if you are 80-52 years of age.  Every year if you are 3 years old or older.  If you are between the ages of 60 and 72 and are a current or former smoker, ask your health care provider if you should have a one-time screening for abdominal aortic aneurysm (AAA).  Diabetes  Have regular diabetes screenings. This checks your fasting blood sugar level. Have the screening done:  Once every three years after age 80 if you are at a normal weight and have a low risk for diabetes.  More often and at a younger age if you are overweight or have a high risk for diabetes.  What should I know about preventing infection?  Hepatitis B  If you have a higher risk for hepatitis B, you should be screened for this virus. Talk with your health care provider to find out if you are at risk for hepatitis B infection.  Hepatitis C  Blood testing is recommended for:  Everyone born from [birth year] through 1967.  Anyone  with known risk factors for hepatitis C.  Sexually transmitted infections (STIs)  You should be screened each year for STIs, including gonorrhea and chlamydia, if:  You are sexually active and are younger than 80 years of age.  You are older than 80 years of age and your health care provider tells you that you are at risk for this type of infection.  Your sexual activity has changed since you were last screened, and you are at increased risk for chlamydia or gonorrhea. Ask your health care provider if you are at risk.  Ask your health care provider about whether you are at high risk for HIV. Your health care provider  may recommend a prescription medicine to help prevent HIV infection. If you choose to take medicine to prevent HIV, you should first get tested for HIV. You should then be tested every 3 months for as long as you are taking the medicine.  Follow these instructions at home:  Alcohol use  Do not drink alcohol if your health care provider tells you not to drink.  If you drink alcohol:  Limit how much you have to 0-2 drinks a day.  Know how much alcohol is in your drink. In the [place] one drink equals one 12 oz bottle of beer (355 mL), one 5 oz glass of wine (148 mL), or one 1 oz glass of hard liquor (44 mL).  Lifestyle  Do not use any products that contain nicotine or tobacco. These products include cigarettes, chewing tobacco, and vaping devices, such as e-cigarettes. If you need help quitting, ask your health care provider.  Do not use street drugs.  Do not share needles.  Ask your health care provider for help if you need support or information about quitting drugs.  General instructions  Schedule regular health, dental, and eye exams.  Stay current with your vaccines.  Tell your health care provider if:  You often feel depressed.  You have ever been abused or do not feel safe at home.  Summary  Adopting a healthy lifestyle and getting preventive care are important in promoting health and wellness.  Follow your health care provider's instructions about healthy diet, exercising, and getting tested or screened for diseases.  Follow your health care provider's instructions on monitoring your cholesterol and blood pressure.  This information is not intended to replace advice given to you by your health care provider. Make sure you discuss any questions you have with your health care provider.  Document Revised: 03/27/2021 Document Reviewed: 03/27/2021  Elsevier Patient Education  2024 [employer].

## 2024-11-06 ENCOUNTER — Telehealth: Payer: Self-pay | Admitting: *Deleted

## 2024-11-06 NOTE — Progress Notes (Signed)
 Care Guide Pharmacy Note  11/06/2024 Name: OSHER OETTINGER MRN: [id number] DOB: 07-30-1946  Referred By: Joshua Debby CROME, MD Reason for referral: Complex Care Management (Outreach to schedule referral with pharmacist )   BUDDY LOEFFELHOLZ is a 78 y.o. year old male who is a primary care patient of Joshua Debby CROME, MD.  TRASHAWN OQUENDO was referred to the pharmacist for assistance related to: reflux   Successful contact was made with the patient to discuss pharmacy services.  Patient declines engagement at this time. Contact information was provided to the patient should they wish to reach out for assistance at a later time.  Thedford Franks, CMA [place]  [facility], [facility] Guide Direct Dial: [phone]  Fax: [phone] Website: [email or web address]

## 2024-11-13 ENCOUNTER — Encounter: Payer: Self-pay | Admitting: *Deleted

## 2024-11-13 NOTE — Progress Notes (Signed)
 Dan Dudley                                          MRN: [id number]   11/13/2024   The VBCI Quality Team Specialist reviewed this patient medical record for the purposes of chart review for care gap closure. The following were reviewed: chart review for care gap closure-controlling blood pressure.    VBCI Quality Team

## 2025-11-03 ENCOUNTER — Encounter: Admitting: Internal Medicine
